# Patient Record
Sex: Female | Born: 1940 | Race: White | Hispanic: No | Marital: Married | Smoking: Current every day smoker
Health system: Southern US, Community
[De-identification: ages and names within clinical notes are randomized; demographics above are authoritative.]

## PROBLEM LIST (undated history)

## (undated) DIAGNOSIS — Z66 Do not resuscitate: Secondary | ICD-10-CM

## (undated) DIAGNOSIS — I10 Essential (primary) hypertension: Secondary | ICD-10-CM

## (undated) DIAGNOSIS — C3492 Malignant neoplasm of unspecified part of left bronchus or lung: Secondary | ICD-10-CM

## (undated) DIAGNOSIS — Z7189 Other specified counseling: Secondary | ICD-10-CM

## (undated) DIAGNOSIS — C801 Malignant (primary) neoplasm, unspecified: Secondary | ICD-10-CM

## (undated) DIAGNOSIS — Z8679 Personal history of other diseases of the circulatory system: Secondary | ICD-10-CM

## (undated) DIAGNOSIS — M810 Age-related osteoporosis without current pathological fracture: Secondary | ICD-10-CM

## (undated) DIAGNOSIS — R7401 Elevation of levels of liver transaminase levels: Secondary | ICD-10-CM

## (undated) DIAGNOSIS — R740 Nonspecific elevation of levels of transaminase and lactic acid dehydrogenase [LDH]: Secondary | ICD-10-CM

## (undated) DIAGNOSIS — E785 Hyperlipidemia, unspecified: Secondary | ICD-10-CM

## (undated) HISTORY — DX: Other specified counseling: Z71.89

## (undated) HISTORY — PX: TUBAL LIGATION: SHX77

## (undated) HISTORY — DX: Nonspecific elevation of levels of transaminase and lactic acid dehydrogenase (ldh): R74.0

## (undated) HISTORY — DX: Age-related osteoporosis without current pathological fracture: M81.0

## (undated) HISTORY — DX: Hyperlipidemia, unspecified: E78.5

## (undated) HISTORY — DX: Essential (primary) hypertension: I10

## (undated) HISTORY — PX: CAROTID ENDARTERECTOMY: SUR193

## (undated) HISTORY — DX: Malignant (primary) neoplasm, unspecified: C80.1

## (undated) HISTORY — DX: Elevation of levels of liver transaminase levels: R74.01

## (undated) HISTORY — DX: Malignant neoplasm of unspecified part of left bronchus or lung: C34.92

---

## 1993-04-14 HISTORY — PX: AORTIC VALVULOPLASTY: SHX142

## 1993-04-14 HISTORY — PX: KIDNEY SURGERY: SHX687

## 2001-03-09 ENCOUNTER — Ambulatory Visit (HOSPITAL_COMMUNITY): Admission: RE | Admit: 2001-03-09 | Discharge: 2001-03-09 | Payer: Self-pay | Admitting: Family Medicine

## 2002-03-15 ENCOUNTER — Ambulatory Visit (HOSPITAL_COMMUNITY): Admission: RE | Admit: 2002-03-15 | Discharge: 2002-03-15 | Payer: Self-pay | Admitting: Unknown Physician Specialty

## 2002-03-15 ENCOUNTER — Encounter: Payer: Self-pay | Admitting: Unknown Physician Specialty

## 2003-03-21 ENCOUNTER — Ambulatory Visit (HOSPITAL_COMMUNITY): Admission: RE | Admit: 2003-03-21 | Discharge: 2003-03-21 | Payer: Self-pay

## 2004-02-20 ENCOUNTER — Ambulatory Visit (HOSPITAL_COMMUNITY): Admission: RE | Admit: 2004-02-20 | Discharge: 2004-02-20 | Payer: Self-pay | Admitting: Unknown Physician Specialty

## 2004-02-28 ENCOUNTER — Ambulatory Visit (HOSPITAL_COMMUNITY): Admission: RE | Admit: 2004-02-28 | Discharge: 2004-02-28 | Payer: Self-pay | Admitting: Unknown Physician Specialty

## 2004-10-12 HISTORY — PX: COLONOSCOPY: SHX174

## 2004-10-30 ENCOUNTER — Ambulatory Visit (HOSPITAL_COMMUNITY): Admission: RE | Admit: 2004-10-30 | Discharge: 2004-10-30 | Payer: Self-pay | Admitting: Internal Medicine

## 2004-10-30 ENCOUNTER — Ambulatory Visit: Payer: Self-pay | Admitting: Internal Medicine

## 2005-02-25 ENCOUNTER — Ambulatory Visit (HOSPITAL_COMMUNITY): Admission: RE | Admit: 2005-02-25 | Discharge: 2005-02-25 | Payer: Self-pay | Admitting: Family Medicine

## 2005-03-05 ENCOUNTER — Ambulatory Visit (HOSPITAL_COMMUNITY): Admission: RE | Admit: 2005-03-05 | Discharge: 2005-03-05 | Payer: Self-pay | Admitting: Family Medicine

## 2005-07-14 ENCOUNTER — Ambulatory Visit (HOSPITAL_COMMUNITY): Admission: RE | Admit: 2005-07-14 | Discharge: 2005-07-14 | Payer: Self-pay | Admitting: Family Medicine

## 2005-07-22 ENCOUNTER — Encounter (INDEPENDENT_AMBULATORY_CARE_PROVIDER_SITE_OTHER): Payer: Self-pay | Admitting: *Deleted

## 2005-07-22 ENCOUNTER — Ambulatory Visit (HOSPITAL_BASED_OUTPATIENT_CLINIC_OR_DEPARTMENT_OTHER): Admission: RE | Admit: 2005-07-22 | Discharge: 2005-07-22 | Payer: Self-pay | Admitting: Urology

## 2005-09-24 ENCOUNTER — Ambulatory Visit (HOSPITAL_COMMUNITY): Admission: RE | Admit: 2005-09-24 | Discharge: 2005-09-24 | Payer: Self-pay | Admitting: Family Medicine

## 2006-04-01 ENCOUNTER — Ambulatory Visit (HOSPITAL_COMMUNITY): Admission: RE | Admit: 2006-04-01 | Discharge: 2006-04-01 | Payer: Self-pay | Admitting: Family Medicine

## 2007-04-06 ENCOUNTER — Ambulatory Visit (HOSPITAL_COMMUNITY): Admission: RE | Admit: 2007-04-06 | Discharge: 2007-04-06 | Payer: Self-pay | Admitting: Family Medicine

## 2008-02-01 ENCOUNTER — Ambulatory Visit (HOSPITAL_COMMUNITY): Admission: RE | Admit: 2008-02-01 | Discharge: 2008-02-01 | Payer: Self-pay | Admitting: Family Medicine

## 2008-07-19 ENCOUNTER — Ambulatory Visit (HOSPITAL_COMMUNITY): Admission: RE | Admit: 2008-07-19 | Discharge: 2008-07-19 | Payer: Self-pay | Admitting: Family Medicine

## 2008-11-21 ENCOUNTER — Emergency Department (HOSPITAL_COMMUNITY): Admission: EM | Admit: 2008-11-21 | Discharge: 2008-11-21 | Payer: Self-pay | Admitting: Emergency Medicine

## 2009-01-16 ENCOUNTER — Ambulatory Visit: Payer: Self-pay | Admitting: Vascular Surgery

## 2009-02-06 ENCOUNTER — Ambulatory Visit (HOSPITAL_COMMUNITY): Admission: RE | Admit: 2009-02-06 | Discharge: 2009-02-06 | Payer: Self-pay | Admitting: Family Medicine

## 2009-12-24 ENCOUNTER — Ambulatory Visit (HOSPITAL_COMMUNITY)
Admission: RE | Admit: 2009-12-24 | Discharge: 2009-12-24 | Payer: Self-pay | Source: Home / Self Care | Admitting: Family Medicine

## 2010-02-07 ENCOUNTER — Ambulatory Visit (HOSPITAL_COMMUNITY): Admission: RE | Admit: 2010-02-07 | Discharge: 2010-02-07 | Payer: Self-pay | Admitting: Family Medicine

## 2010-07-20 LAB — CBC
HCT: 38.6 % (ref 36.0–46.0)
Hemoglobin: 13.4 g/dL (ref 12.0–15.0)
MCHC: 34.7 g/dL (ref 30.0–36.0)
MCV: 92.5 fL (ref 78.0–100.0)
Platelets: 199 10*3/uL (ref 150–400)
RBC: 4.18 MIL/uL (ref 3.87–5.11)
RDW: 12.9 % (ref 11.5–15.5)
WBC: 10 10*3/uL (ref 4.0–10.5)

## 2010-07-20 LAB — URINALYSIS, ROUTINE W REFLEX MICROSCOPIC
Bilirubin Urine: NEGATIVE
Glucose, UA: NEGATIVE mg/dL
Hgb urine dipstick: NEGATIVE
Ketones, ur: NEGATIVE mg/dL
Nitrite: NEGATIVE
Protein, ur: NEGATIVE mg/dL
Specific Gravity, Urine: 1.025 (ref 1.005–1.030)
Urobilinogen, UA: 0.2 mg/dL (ref 0.0–1.0)
pH: 5.5 (ref 5.0–8.0)

## 2010-07-20 LAB — DIFFERENTIAL
Basophils Absolute: 0 10*3/uL (ref 0.0–0.1)
Basophils Relative: 0 % (ref 0–1)
Eosinophils Absolute: 0.1 10*3/uL (ref 0.0–0.7)
Eosinophils Relative: 1 % (ref 0–5)
Lymphocytes Relative: 12 % (ref 12–46)
Lymphs Abs: 1.2 10*3/uL (ref 0.7–4.0)
Monocytes Absolute: 0.4 10*3/uL (ref 0.1–1.0)
Monocytes Relative: 4 % (ref 3–12)
Neutro Abs: 8.3 10*3/uL — ABNORMAL HIGH (ref 1.7–7.7)
Neutrophils Relative %: 83 % — ABNORMAL HIGH (ref 43–77)

## 2010-07-20 LAB — BASIC METABOLIC PANEL
BUN: 20 mg/dL (ref 6–23)
CO2: 25 mEq/L (ref 19–32)
Calcium: 10.2 mg/dL (ref 8.4–10.5)
Chloride: 103 mEq/L (ref 96–112)
Creatinine, Ser: 0.78 mg/dL (ref 0.4–1.2)
GFR calc Af Amer: >60 mL/min (ref >60)
GFR calc non Af Amer: >60 mL/min (ref >60)
Glucose, Bld: 191 mg/dL — ABNORMAL HIGH (ref 70–99)
Potassium: 3.6 mEq/L (ref 3.5–5.1)
Sodium: 138 mEq/L (ref 135–145)

## 2010-08-27 NOTE — Procedures (Signed)
CAROTID DUPLEX EXAM   INDICATION:  History of carotid endarterectomy.   HISTORY:  Diabetes:  No.  Cardiac:  No.  Hypertension:  Yes.  Smoking:  Yes.  Previous Surgery:  Right carotid endarterectomy 15 years ago, per  patient.  CV History:  Asymptomatic.  Amaurosis Fugax No, Paresthesias No, Hemiparesis No.                                       RIGHT             LEFT  Brachial systolic pressure:         146               148  Brachial Doppler waveforms:         Normal            Normal  Vertebral direction of flow:        Antegrade         Antegrade  DUPLEX VELOCITIES (cm/sec)  CCA peak systolic                   P = 93/ D = 194   90  ECA peak systolic                   161               87  ICA peak systolic                   108               120  ICA end diastolic                   40                32  PLAQUE MORPHOLOGY:                  Heterogenous      Mixed  PLAQUE AMOUNT:                      Moderate/severe   Mild  PLAQUE LOCATION:                    ICA/CCA           ICA/CCA   IMPRESSION:  1. Patent right carotid endarterectomy site with 1-39% stenosis of the      right internal carotid artery and a significant increased velocity      noted at the right distal common carotid artery.  2. 40-59% stenosis of the left internal carotid artery.         ___________________________________________  Quita Skye Hart [place], M.D.   CH/MEDQ  D:  01/16/2009  T:  01/17/2009  Job:  [id number]

## 2010-08-27 NOTE — Procedures (Signed)
RENAL ARTERY DUPLEX EVALUATION   INDICATION:  Follow up renal artery stent with right flank pain.   HISTORY:  Diabetes:  No.  Cardiac:  No.  Hypertension:  Yes.  Smoking:  Yes.   RENAL ARTERY DUPLEX FINDINGS:  Aorta-Proximal:  32 cm/s  Aorta-Mid:  40 cm/s  Aorta-Distal:  89 cm/s  Celiac Artery Origin:  85 cm/s  SMA Origin:  110 cm/s                                    RIGHT               LEFT  Renal Artery Origin:             73 cm/s             73 cm/s  Renal Artery Proximal:           120 cm/s            32 cm/s  Renal Artery Mid:                74 cm/s             33 cm/s  Renal Artery Distal:             106 cm/s            38 cm/s  Hilar Acceleration Time (AT):  Renal-Aortic Ratio (RAR):        2.2                 2.2  Kidney Size:                     7.35 cm             10.6 cm  End Diastolic Ratio (EDR):  Resistive Index (RI):            0.68                0.75   IMPRESSION:  1. Patent aortobifemoral bypass graft with no evidence of focal      stenosis.  2. Patent bilateral renal arteries with no evidence of stenosis.  3. Bilateral kidneys measure within normal limits.  4. Bilateral resistive indices are within normal limits.   ___________________________________________  Quita Skye Hart [place], M.D.   MG/MEDQ  D:  01/16/2009  T:  01/17/2009  Job:  [id number]

## 2010-08-27 NOTE — Consult Note (Signed)
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  She did well from  that standpoint with correction of her hypertension and has done well  over the years from that standpoint.  Has not had any claudication  symptoms.  She developed right posterior flank discomfort about 2 months  ago and a CT scan was performed which revealed infarction of the lower  pole of the right kidney.  She was evaluated by Dr. Vonita Moss.  CT  angiogram was performed which I reviewed today.  This reveals the area  of infarction in the lower pole of the kidney and reveals widely patent  bilateral aorto renal bypass grafts with similar sized kidneys.  No  other pathology was noted and she was referred for further evaluation.   PAST MEDICAL HISTORY:  1. Hypertension.  2. History of right lower pole renal infarction.  3. Negative for coronary artery disease, hyperlipidemia, COPD or      stroke.  4. History of bladder cancer requiring periodic cystoscopy.   FAMILY HISTORY:  Positive for coronary artery disease in her mother who  had coronary artery bypass grafting, stroke in a grandmother.  Negative  for diabetes.   SOCIAL HISTORY:  She is married, has two children, is self-employed.  Smokes half pack of cigarettes per day now but has smoked greater than a  pack of cigarettes per day for many years of her life.  Does not use  alcohol.   REVIEW OF SYSTEMS:  Has occasional chest tightness.  No dyspnea on  exertion, palpitations or orthopnea.  Denies any pulmonary or GI  symptoms.  Does have urinary frequency.  No lower extremity claudication  or  neurologic, orthopedic or ENT or hematologic problems.  All other  systems negative.   ALLERGIES:  To sulfa.   MEDICATIONS:  Please see health history exam.   PHYSICAL EXAM:  Vital signs:  Blood pressure 147/82, heart rate 69,  respirations 14.  General:  She is a healthy-appearing female in no  apparent distress, alert and oriented x3.  Neck:  Supple, 3+ carotid  pulses palpable.  No bruits are audible.  Neurological:  Exam is normal.  No palpable adenopathy in the neck.  Chest:  Clear to auscultation.  Cardiovascular:  Regular rhythm.  No murmurs.  Abdomen:  Soft, nontender  with no masses.  No bruits are audible in the abdomen.  She has 3+  femoral and popliteal pulses bilaterally with well-perfused lower  extremities.   PAST SURGICAL HISTORY:  1. Includes aorta to right femoral and left iliac bypass and bilateral      renal artery bypass in 1996.  2. Right carotid endarterectomy approximately 1995.   Duplex scan performed in our office today revealed both renal artery  bypass grafts to be widely patent with normal appearing flow and no  evidence of any stenosis or abnormalities with symmetrical kidneys,  right kidney 11.35, left kidney 10.66 cm.   The patient had a recent right  renal infarction but it does not appear  to be related to her renal artery bypass grafts which appear to be  widely patent and functioning normally.  There is no need for any  further evaluation of these grafts at the present time unless she  develops new or further problems.  Carotid duplex exam was performed  today since she has not had on in 15 years and had previous right  carotid surgery.  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  No  flow reduction on the right side where the surgery was performed.   Quita Skye Hart [place], M.D.  Electronically Signed   JDL/MEDQ  D:  01/16/2009  T:  01/17/2009  Job:  [id number]   cc:   Lorin Picket A. Gerda Diss, MD  Maretta Bees. Vonita Moss, M.D.

## 2010-08-30 NOTE — Op Note (Signed)
[name], Michelle Mendoza             ACCOUNT NO.:  [id number]   MEDICAL RECORD NO.:  [id number]          PATIENT TYPE:  AMB   LOCATION:  DAY                           FACILITY:  [facility]   PHYSICIAN:  R. Roetta Sessions, M.D. DATE OF BIRTH:  Jun 16, 1940   DATE OF PROCEDURE:  10/30/2004  DATE OF DISCHARGE:                                 OPERATIVE REPORT   PROCEDURE:  Screening colonoscopy.   INDICATIONS FOR PROCEDURE:  The patient is a 70 year old lady referred over  courtesy of Dr. Lilyan Punt for colorectal cancer screening.  She is devoid  of any lower GI tract symptoms.  There is no family history of colorectal  neoplasia.  She had a colonoscopy several years ago by Dr. Lovell Sheehan without  any significant findings. Colonoscopy is now being done as a screening  maneuver.  This procedure has been discussed with the patient at length.  The potential risks, benefits, and alternatives have been reviewed.  Questions answered.  All parties are agreeable.   PROCEDURE NOTE:  O2 saturation, blood pressure, pulse __________ monitored  throughout the entire procedure.   CONSCIOUS SEDATION:  Versed 3 mg IV, Demerol 50 mg IV in divided doses.   INSTRUMENT:  Olympus video __________ system.   FINDINGS:  Digital exam revealed no abnormalities.  Endoscopic findings:  Prep was good.  Rectum:  Examination of the rectum mucosa including  retroflexion at the anal verge revealed only minimal internal hemorrhoids.   Colonic mucosa was surveyed from the rectosigmoid junction, through the  left, transverse, and right colon, to the appendiceal orifice, ileocecal  valve, and cecum.  These structures were well seen and photographed for the  record.  From this level, the scope was slowly withdrawn.  All previously  mentioned mucosal surfaces were again seen.  The colonic mucosa appeared  normal, and again the rectal mucosa appeared normal aside from minimal  internal hemorrhoids.  The patient tolerated the  procedure well and was  reactivated.   ENDOSCOPIC IMPRESSION:  Minimal internal hemorrhoids.  Otherwise normal  rectum and colon.   RECOMMENDATIONS:  Repeat colonoscopy in 10 years.       RMR/MEDQ  D:  10/30/2004  T:  10/30/2004  Job:  [id number]   cc:   Lorin Picket A. Gerda Diss, MD  [address], [address]  [place]  [place] [address]  Fax: [phone]   R. Roetta Sessions, M.D.  [address]  [place]  [place] [id number]

## 2010-08-30 NOTE — Op Note (Signed)
NAMEQUANTA, Michelle Mendoza             ACCOUNT NO.:  [id number]   MEDICAL RECORD NO.:  [id number]          PATIENT TYPE:  AMB   LOCATION:  [facility]                         FACILITY:  [facility]   PHYSICIAN:  Maretta Bees. Vonita Moss, M.D.DATE OF BIRTH:  1941/02/10   DATE OF PROCEDURE:  07/22/2005  DATE OF DISCHARGE:                                 OPERATIVE REPORT   PREOPERATIVE DIAGNOSIS:  Bladder carcinoma.   POSTOPERATIVE DIAGNOSIS:  Bladder carcinoma.   PROCEDURE:  Cystoscopy, urethral dilation, resection of bladder tumor (small  tumor overlying left intramural ureter) and bilateral retrograde pyelograms  with interpretation.   SURGEON:  Maretta Bees. Vonita Moss, M.D.   ANESTHESIA:  General.   INDICATIONS:  This lady has had gross hematuria and was found in office  workup to have a papillary tumor over the left intramural ureter.  She is  brought to the OR today for further evaluation, treatment and therapy.   PROCEDURE:  The patient is brought to the operating room and placed in  lithotomy position.  External genitalia were prepped and draped in the usual  fashion.  She was cystoscoped after dilating a mildly stenotic urethral  meatus.  The only abnormality was two small papillary tumors overlying the  left intramural ureter, and there was bleeding from the larger of the two  tumors.  No other lesions were seen in the bladder.   Bilateral retrograde pyelograms were obtained by using the cone-tip catheter  inserted per the cystoscope, and the upper tracts were normal with no  obstruction and no filling defects.   I left the left ureteral catheter in the left ureter and then removed the  cystoscope and reinserted it with the cold cup bladder biopsy forceps.  I  then resected the two small papillary tumors totally.  The bases of the  biopsy sites were fulgurated.  The lesions appeared to be very superficial.  The ureteral orifice and the intramural ureter were essentially intact at  the completion  the procedure and there was no more bleeding.  The bladder  was emptied and the scope removed and the patient sent to the recovery room  in good condition having tolerated the procedure well.      Maretta Bees. Vonita Moss, M.D.  Electronically Signed     LJP/MEDQ  D:  07/22/2005  T:  07/22/2005  Job:  [id number]

## 2010-11-27 ENCOUNTER — Other Ambulatory Visit: Payer: Self-pay | Admitting: Family Medicine

## 2010-11-27 DIAGNOSIS — Z139 Encounter for screening, unspecified: Secondary | ICD-10-CM

## 2011-02-10 ENCOUNTER — Ambulatory Visit (HOSPITAL_COMMUNITY)
Admission: RE | Admit: 2011-02-10 | Discharge: 2011-02-10 | Disposition: A | Discharge status: Home / Self Care | Payer: Medicare Other | Source: Ambulatory Visit | Attending: Family Medicine | Admitting: Family Medicine

## 2011-02-10 DIAGNOSIS — Z139 Encounter for screening, unspecified: Secondary | ICD-10-CM

## 2011-02-10 DIAGNOSIS — Z1231 Encounter for screening mammogram for malignant neoplasm of breast: Secondary | ICD-10-CM | POA: Insufficient documentation

## 2011-09-10 DIAGNOSIS — E782 Mixed hyperlipidemia: Secondary | ICD-10-CM | POA: Diagnosis not present

## 2011-09-10 DIAGNOSIS — Z79899 Other long term (current) drug therapy: Secondary | ICD-10-CM | POA: Diagnosis not present

## 2011-09-30 ENCOUNTER — Other Ambulatory Visit: Payer: Self-pay | Admitting: Family Medicine

## 2011-09-30 DIAGNOSIS — M949 Disorder of cartilage, unspecified: Secondary | ICD-10-CM | POA: Diagnosis not present

## 2011-09-30 DIAGNOSIS — E785 Hyperlipidemia, unspecified: Secondary | ICD-10-CM | POA: Diagnosis not present

## 2011-09-30 DIAGNOSIS — M8580 Other specified disorders of bone density and structure, unspecified site: Secondary | ICD-10-CM

## 2011-09-30 DIAGNOSIS — I10 Essential (primary) hypertension: Secondary | ICD-10-CM | POA: Diagnosis not present

## 2011-09-30 DIAGNOSIS — M899 Disorder of bone, unspecified: Secondary | ICD-10-CM | POA: Diagnosis not present

## 2011-10-14 ENCOUNTER — Other Ambulatory Visit (HOSPITAL_COMMUNITY): Payer: Medicare Other

## 2011-10-21 ENCOUNTER — Ambulatory Visit (HOSPITAL_COMMUNITY)
Admission: RE | Admit: 2011-10-21 | Discharge: 2011-10-21 | Disposition: A | Discharge status: Home / Self Care | Payer: Medicare Other | Source: Ambulatory Visit | Attending: Family Medicine | Admitting: Family Medicine

## 2011-10-21 DIAGNOSIS — M8580 Other specified disorders of bone density and structure, unspecified site: Secondary | ICD-10-CM

## 2011-10-21 DIAGNOSIS — Z780 Asymptomatic menopausal state: Secondary | ICD-10-CM | POA: Diagnosis not present

## 2011-10-21 DIAGNOSIS — M899 Disorder of bone, unspecified: Secondary | ICD-10-CM | POA: Insufficient documentation

## 2011-10-21 DIAGNOSIS — M949 Disorder of cartilage, unspecified: Secondary | ICD-10-CM | POA: Insufficient documentation

## 2011-12-02 ENCOUNTER — Ambulatory Visit (INDEPENDENT_AMBULATORY_CARE_PROVIDER_SITE_OTHER): Payer: Medicare Other | Admitting: Urology

## 2011-12-02 DIAGNOSIS — Z8551 Personal history of malignant neoplasm of bladder: Secondary | ICD-10-CM | POA: Diagnosis not present

## 2011-12-02 DIAGNOSIS — C679 Malignant neoplasm of bladder, unspecified: Secondary | ICD-10-CM

## 2012-01-05 ENCOUNTER — Other Ambulatory Visit: Payer: Self-pay | Admitting: Family Medicine

## 2012-01-05 DIAGNOSIS — IMO0001 Reserved for inherently not codable concepts without codable children: Secondary | ICD-10-CM

## 2012-02-12 ENCOUNTER — Ambulatory Visit (HOSPITAL_COMMUNITY)
Admission: RE | Admit: 2012-02-12 | Discharge: 2012-02-12 | Disposition: A | Discharge status: Home / Self Care | Payer: Medicare Other | Source: Ambulatory Visit | Attending: Family Medicine | Admitting: Family Medicine

## 2012-02-12 DIAGNOSIS — Z1231 Encounter for screening mammogram for malignant neoplasm of breast: Secondary | ICD-10-CM | POA: Diagnosis not present

## 2012-02-12 DIAGNOSIS — IMO0001 Reserved for inherently not codable concepts without codable children: Secondary | ICD-10-CM

## 2012-02-12 LAB — HM MAMMOGRAPHY

## 2012-03-04 DIAGNOSIS — Z01419 Encounter for gynecological examination (general) (routine) without abnormal findings: Secondary | ICD-10-CM | POA: Diagnosis not present

## 2012-03-04 DIAGNOSIS — I10 Essential (primary) hypertension: Secondary | ICD-10-CM | POA: Diagnosis not present

## 2012-03-04 DIAGNOSIS — E785 Hyperlipidemia, unspecified: Secondary | ICD-10-CM | POA: Diagnosis not present

## 2012-09-22 ENCOUNTER — Other Ambulatory Visit (HOSPITAL_COMMUNITY): Payer: Self-pay | Admitting: Family Medicine

## 2012-10-08 ENCOUNTER — Other Ambulatory Visit (HOSPITAL_COMMUNITY): Payer: Self-pay | Admitting: Family Medicine

## 2012-10-26 ENCOUNTER — Other Ambulatory Visit (HOSPITAL_COMMUNITY): Payer: Self-pay | Admitting: Family Medicine

## 2012-12-07 ENCOUNTER — Ambulatory Visit (INDEPENDENT_AMBULATORY_CARE_PROVIDER_SITE_OTHER): Payer: Medicare Other | Admitting: Urology

## 2012-12-07 DIAGNOSIS — C679 Malignant neoplasm of bladder, unspecified: Secondary | ICD-10-CM | POA: Diagnosis not present

## 2012-12-07 DIAGNOSIS — N281 Cyst of kidney, acquired: Secondary | ICD-10-CM | POA: Diagnosis not present

## 2012-12-22 DIAGNOSIS — H52229 Regular astigmatism, unspecified eye: Secondary | ICD-10-CM | POA: Diagnosis not present

## 2012-12-22 DIAGNOSIS — H524 Presbyopia: Secondary | ICD-10-CM | POA: Diagnosis not present

## 2012-12-22 DIAGNOSIS — H35369 Drusen (degenerative) of macula, unspecified eye: Secondary | ICD-10-CM | POA: Diagnosis not present

## 2012-12-22 DIAGNOSIS — H5200 Hypermetropia, unspecified eye: Secondary | ICD-10-CM | POA: Diagnosis not present

## 2012-12-23 DIAGNOSIS — E785 Hyperlipidemia, unspecified: Secondary | ICD-10-CM | POA: Diagnosis not present

## 2012-12-23 DIAGNOSIS — Z01419 Encounter for gynecological examination (general) (routine) without abnormal findings: Secondary | ICD-10-CM | POA: Diagnosis not present

## 2012-12-23 DIAGNOSIS — I10 Essential (primary) hypertension: Secondary | ICD-10-CM | POA: Diagnosis not present

## 2012-12-24 ENCOUNTER — Other Ambulatory Visit (HOSPITAL_COMMUNITY): Payer: Self-pay | Admitting: Family Medicine

## 2012-12-27 NOTE — Telephone Encounter (Signed)
Refill x1+ patient needs office visit by the end of the year

## 2012-12-28 ENCOUNTER — Other Ambulatory Visit: Payer: Self-pay | Admitting: *Deleted

## 2012-12-28 ENCOUNTER — Other Ambulatory Visit (HOSPITAL_COMMUNITY): Payer: Self-pay | Admitting: Unknown Physician Specialty

## 2012-12-28 DIAGNOSIS — Z139 Encounter for screening, unspecified: Secondary | ICD-10-CM

## 2012-12-28 MED ORDER — LISINOPRIL-HYDROCHLOROTHIAZIDE 20-12.5 MG PO TABS: ORAL_TABLET | ORAL | Status: DC

## 2012-12-31 ENCOUNTER — Telehealth: Payer: Self-pay | Admitting: Family Medicine

## 2012-12-31 ENCOUNTER — Other Ambulatory Visit: Payer: Self-pay

## 2012-12-31 DIAGNOSIS — E785 Hyperlipidemia, unspecified: Secondary | ICD-10-CM

## 2012-12-31 DIAGNOSIS — Z79899 Other long term (current) drug therapy: Secondary | ICD-10-CM

## 2012-12-31 NOTE — Telephone Encounter (Signed)
Bloodwork was ordered. Patient was notified.  

## 2012-12-31 NOTE — Telephone Encounter (Signed)
Patient would like paperwork faxed to [phone] to have blood work completed

## 2013-01-03 ENCOUNTER — Other Ambulatory Visit: Payer: Self-pay | Admitting: Family Medicine

## 2013-01-05 DIAGNOSIS — E785 Hyperlipidemia, unspecified: Secondary | ICD-10-CM | POA: Diagnosis not present

## 2013-01-05 DIAGNOSIS — Z79899 Other long term (current) drug therapy: Secondary | ICD-10-CM | POA: Diagnosis not present

## 2013-01-05 LAB — HEPATIC FUNCTION PANEL
AST: 55 U/L — ABNORMAL HIGH (ref 0–37)
Alkaline Phosphatase: 46 U/L (ref 39–117)
Bilirubin, Direct: 0.2 mg/dL (ref 0.0–0.3)
Indirect Bilirubin: 0.7 mg/dL (ref 0.0–0.9)
Total Bilirubin: 0.9 mg/dL (ref 0.3–1.2)

## 2013-01-05 LAB — BASIC METABOLIC PANEL
CO2: 32 mEq/L (ref 19–32)
Calcium: 9.8 mg/dL (ref 8.4–10.5)
Chloride: 102 mEq/L (ref 96–112)
Creat: 1.2 mg/dL — ABNORMAL HIGH (ref 0.50–1.10)
Glucose, Bld: 87 mg/dL (ref 70–99)

## 2013-01-05 LAB — LIPID PANEL: LDL Cholesterol: 68 mg/dL (ref 0–99)

## 2013-01-12 ENCOUNTER — Encounter: Payer: Self-pay | Admitting: Family Medicine

## 2013-01-12 ENCOUNTER — Ambulatory Visit (INDEPENDENT_AMBULATORY_CARE_PROVIDER_SITE_OTHER): Payer: Medicare Other | Admitting: Family Medicine

## 2013-01-12 VITALS — BP 142/72 | Ht 63.0 in | Wt 124.4 lb

## 2013-01-12 DIAGNOSIS — I10 Essential (primary) hypertension: Secondary | ICD-10-CM | POA: Diagnosis not present

## 2013-01-12 DIAGNOSIS — E785 Hyperlipidemia, unspecified: Secondary | ICD-10-CM

## 2013-01-12 DIAGNOSIS — N289 Disorder of kidney and ureter, unspecified: Secondary | ICD-10-CM | POA: Insufficient documentation

## 2013-01-12 DIAGNOSIS — K7689 Other specified diseases of liver: Secondary | ICD-10-CM | POA: Diagnosis not present

## 2013-01-12 DIAGNOSIS — K760 Fatty (change of) liver, not elsewhere classified: Secondary | ICD-10-CM | POA: Insufficient documentation

## 2013-01-12 MED ORDER — PRAVASTATIN SODIUM 20 MG PO TABS: ORAL_TABLET | ORAL | Status: DC

## 2013-01-12 MED ORDER — ATENOLOL 50 MG PO TABS: ORAL_TABLET | ORAL | Status: DC

## 2013-01-12 MED ORDER — LISINOPRIL-HYDROCHLOROTHIAZIDE 20-12.5 MG PO TABS: ORAL_TABLET | ORAL | Status: DC

## 2013-01-12 NOTE — Patient Instructions (Signed)
DASH Diet  The DASH diet stands for "Dietary Approaches to Stop Hypertension." It is a healthy eating plan that has been shown to reduce high blood pressure (hypertension) in as little as 14 days, while also possibly providing other significant health benefits. These other health benefits include reducing the risk of breast cancer after menopause and reducing the risk of type 2 diabetes, heart disease, colon cancer, and stroke. Health benefits also include weight loss and slowing kidney failure in patients with chronic kidney disease.   DIET GUIDELINES  · Limit salt (sodium). Your diet should contain less than 1500 mg of sodium daily.  · Limit refined or processed carbohydrates. Your diet should include mostly whole grains. Desserts and added sugars should be used sparingly.  · Include small amounts of heart-healthy fats. These types of fats include nuts, oils, and tub margarine. Limit saturated and trans fats. These fats have been shown to be harmful in the body.  CHOOSING FOODS   The following food groups are based on a 2000 calorie diet. See your Registered Dietitian for individual calorie needs.  Grains and Grain Products (6 to 8 servings daily)  · Eat More Often: Whole-wheat bread, brown rice, whole-grain or wheat pasta, quinoa, popcorn without added fat or salt (air popped).  · Eat Less Often: White bread, white pasta, white rice, cornbread.  Vegetables (4 to 5 servings daily)  · Eat More Often: Fresh, frozen, and canned vegetables. Vegetables may be raw, steamed, roasted, or grilled with a minimal amount of fat.  · Eat Less Often/Avoid: Creamed or fried vegetables. Vegetables in a cheese sauce.  Fruit (4 to 5 servings daily)  · Eat More Often: All fresh, canned (in natural juice), or frozen fruits. Dried fruits without added sugar. One hundred percent fruit juice (½ cup [237 mL] daily).  · Eat Less Often: Dried fruits with added sugar. Canned fruit in light or heavy syrup.  Lean Meats, Fish, and Poultry (2  servings or less daily. One serving is 3 to 4 oz [85-114 g]).  · Eat More Often: Ninety percent or leaner ground beef, tenderloin, sirloin. Round cuts of beef, chicken breast, turkey breast. All fish. Grill, bake, or broil your meat. Nothing should be fried.  · Eat Less Often/Avoid: Fatty cuts of meat, turkey, or chicken leg, thigh, or wing. Fried cuts of meat or fish.  Dairy (2 to 3 servings)  · Eat More Often: Low-fat or fat-free milk, low-fat plain or light yogurt, reduced-fat or part-skim cheese.  · Eat Less Often/Avoid: Milk (whole, 2%). Whole milk yogurt. Full-fat cheeses.  Nuts, Seeds, and Legumes (4 to 5 servings per week)  · Eat More Often: All without added salt.  · Eat Less Often/Avoid: Salted nuts and seeds, canned beans with added salt.  Fats and Sweets (limited)  · Eat More Often: Vegetable oils, tub margarines without trans fats, sugar-free gelatin. Mayonnaise and salad dressings.  · Eat Less Often/Avoid: Coconut oils, palm oils, butter, stick margarine, cream, half and half, cookies, candy, pie.  FOR MORE INFORMATION  The Dash Diet Eating Plan: www.dashdiet.org  Document Released: 03/20/2011 Document Revised: 06/23/2011 Document Reviewed: 03/20/2011  Exit[employer]® Patient Information ©2014 [employer].

## 2013-01-12 NOTE — Progress Notes (Signed)
  Subjective:    Patient ID: Michelle Mendoza, female    DOB: May 29, 1940, 72 y.o.   MRN: [id number]  HPI Here today for 6 month check up. This patient has a history hyperlipidemia hypertension renal insufficiency fatty liver. She's been trying eat relatively healthy. She states she's very active and her family workplace. She does not type of aerobic activity. She does smoke she knows she needs to quit. She denies any chest tightness pressure pain. Denies any coughing denies any vomiting. She relates compliance with her medicines. Family history reviewed Review of systems see above  She had BW done on 9/24  No concerns.    Review of Systems    denies rectal bleeding denies headaches no chest pain or shortness of breath blood pressure stable, her lungs are clear. Heart regular. Pulse normal. Extremities no edema. Skin warm dry.  Objective:   Physical Exam Please see above.       Assessment & Plan:  #1 HTN decent control continue current medication #2 renal insufficiency stable. Recheck metabolic 7 again in 6 months #3 hyperlipidemia overall very good continue current medication check lab work in 6 months Fatty liver-healthy diet monitor liver profile 6 months Encouraged patient to quit smoking.

## 2013-01-29 DIAGNOSIS — Z23 Encounter for immunization: Secondary | ICD-10-CM | POA: Diagnosis not present

## 2013-02-14 ENCOUNTER — Ambulatory Visit (HOSPITAL_COMMUNITY)
Admission: RE | Admit: 2013-02-14 | Discharge: 2013-02-14 | Disposition: A | Discharge status: Home / Self Care | Payer: Medicare Other | Source: Ambulatory Visit | Attending: Unknown Physician Specialty | Admitting: Unknown Physician Specialty

## 2013-02-14 DIAGNOSIS — Z1231 Encounter for screening mammogram for malignant neoplasm of breast: Secondary | ICD-10-CM | POA: Insufficient documentation

## 2013-02-14 DIAGNOSIS — Z139 Encounter for screening, unspecified: Secondary | ICD-10-CM

## 2013-06-01 ENCOUNTER — Telehealth: Payer: Self-pay | Admitting: Family Medicine

## 2013-06-01 DIAGNOSIS — Z79899 Other long term (current) drug therapy: Secondary | ICD-10-CM

## 2013-06-01 NOTE — Telephone Encounter (Signed)
Needs liver and Met 7 only (also f/u ov)

## 2013-06-01 NOTE — Telephone Encounter (Signed)
Had lipid liver met 7 on 01/05/13

## 2013-06-01 NOTE — Telephone Encounter (Signed)
Patient needs blood work.

## 2013-06-02 NOTE — Telephone Encounter (Signed)
Bloodwork ordered in the system. Patient was notified.

## 2013-06-15 DIAGNOSIS — Z79899 Other long term (current) drug therapy: Secondary | ICD-10-CM | POA: Diagnosis not present

## 2013-06-15 LAB — HEPATIC FUNCTION PANEL
ALT: <8 U/L (ref 0–35)
AST: 50 U/L — AB (ref 0–37)
Albumin: 4 g/dL (ref 3.5–5.2)
Alkaline Phosphatase: 46 U/L (ref 39–117)
Bilirubin, Direct: 0.1 mg/dL (ref 0.0–0.3)
Indirect Bilirubin: 0.6 mg/dL (ref 0.2–1.2)
TOTAL PROTEIN: 6.9 g/dL (ref 6.0–8.3)
Total Bilirubin: 0.7 mg/dL (ref 0.2–1.2)

## 2013-06-15 LAB — BASIC METABOLIC PANEL
BUN: 24 mg/dL — AB (ref 6–23)
CO2: 29 mEq/L (ref 19–32)
Calcium: 9.8 mg/dL (ref 8.4–10.5)
Chloride: 100 mEq/L (ref 96–112)
Creat: 1.06 mg/dL (ref 0.50–1.10)
Glucose, Bld: 92 mg/dL (ref 70–99)
POTASSIUM: 4.4 meq/L (ref 3.5–5.3)
Sodium: 137 mEq/L (ref 135–145)

## 2013-06-21 ENCOUNTER — Ambulatory Visit (INDEPENDENT_AMBULATORY_CARE_PROVIDER_SITE_OTHER): Payer: Medicare Other | Admitting: Family Medicine

## 2013-06-21 ENCOUNTER — Encounter: Payer: Self-pay | Admitting: Family Medicine

## 2013-06-21 VITALS — BP 128/72 | Ht 63.0 in | Wt 121.0 lb

## 2013-06-21 DIAGNOSIS — I10 Essential (primary) hypertension: Secondary | ICD-10-CM

## 2013-06-21 DIAGNOSIS — K7689 Other specified diseases of liver: Secondary | ICD-10-CM

## 2013-06-21 DIAGNOSIS — N289 Disorder of kidney and ureter, unspecified: Secondary | ICD-10-CM | POA: Diagnosis not present

## 2013-06-21 DIAGNOSIS — K760 Fatty (change of) liver, not elsewhere classified: Secondary | ICD-10-CM

## 2013-06-21 DIAGNOSIS — E785 Hyperlipidemia, unspecified: Secondary | ICD-10-CM

## 2013-06-21 MED ORDER — LISINOPRIL-HYDROCHLOROTHIAZIDE 20-12.5 MG PO TABS: ORAL_TABLET | ORAL | Status: DC

## 2013-06-21 MED ORDER — PRAVASTATIN SODIUM 20 MG PO TABS
ORAL_TABLET | ORAL | Status: DC
Start: 2013-06-21 — End: 2014-04-13

## 2013-06-21 MED ORDER — ATENOLOL 50 MG PO TABS: ORAL_TABLET | ORAL | Status: DC

## 2013-06-21 NOTE — Progress Notes (Addendum)
   Subjective:    Patient ID: Michelle Mendoza, female    DOB: Sep 27, 1940, 73 y.o.   MRN: [id number]  HPI Patient is here today for 6 month check up. She relates that she's taking her medicine as directed she does try the healthy she does try to stay physically active she denies any type of chest tightness pressure pain shortness breath or vomiting. She is trying to keep her weight from going up she is aware of her fatty liver. She also had renal insufficiency on last lab work but recently had blood work done and is here today to go over the results of this. She also takes her cholesterol medicine as recommended to keep her hyperlipidemia under good control. She is a smoker she was counseled to quit smoking. She denies being depressed She already got her BW done.  She has no questions/concerns.    Review of Systems  Constitutional: Negative for activity change, appetite change and fatigue.  HENT: Negative for congestion, ear discharge and rhinorrhea.   Eyes: Negative for discharge.  Respiratory: Negative for cough, chest tightness and wheezing.   Cardiovascular: Negative for chest pain.  Gastrointestinal: Negative for vomiting and abdominal pain.  Genitourinary: Negative for frequency and difficulty urinating.  Musculoskeletal: Negative for neck pain.  Allergic/Immunologic: Negative for environmental allergies and food allergies.  Neurological: Negative for weakness and headaches.  Psychiatric/Behavioral: Negative for behavioral problems and agitation.       Objective:   Physical Exam  Her lungs are clear hearts regular pulse normal neck no masses abdomen soft no guarding or rebound extremities no edema skin warm dry neurologic grossly normal      Assessment & Plan:  Essential hypertension, benign  Renal insufficiency  Hyperlipemia  Fatty liver  Her blood pressure under good control, kidney function try to better than what they were, lipid panel 6 months ago look good no  need to repeat it currently repeated again in 6 months continue medication watch diet Liver enzymes slightly improved watch diet regular exercise keep fatty liver under control.

## 2013-12-20 ENCOUNTER — Ambulatory Visit (INDEPENDENT_AMBULATORY_CARE_PROVIDER_SITE_OTHER): Payer: Medicare Other | Admitting: Urology

## 2013-12-20 DIAGNOSIS — C672 Malignant neoplasm of lateral wall of bladder: Secondary | ICD-10-CM

## 2013-12-20 DIAGNOSIS — C679 Malignant neoplasm of bladder, unspecified: Secondary | ICD-10-CM | POA: Diagnosis not present

## 2014-01-04 ENCOUNTER — Other Ambulatory Visit: Payer: Self-pay | Admitting: Family Medicine

## 2014-01-04 DIAGNOSIS — Z139 Encounter for screening, unspecified: Secondary | ICD-10-CM

## 2014-01-04 DIAGNOSIS — Z1231 Encounter for screening mammogram for malignant neoplasm of breast: Secondary | ICD-10-CM

## 2014-01-04 DIAGNOSIS — Z01419 Encounter for gynecological examination (general) (routine) without abnormal findings: Secondary | ICD-10-CM | POA: Diagnosis not present

## 2014-01-23 DIAGNOSIS — Z23 Encounter for immunization: Secondary | ICD-10-CM | POA: Diagnosis not present

## 2014-02-15 ENCOUNTER — Ambulatory Visit (HOSPITAL_COMMUNITY)
Admission: RE | Admit: 2014-02-15 | Discharge: 2014-02-15 | Disposition: A | Discharge status: Home / Self Care | Payer: Medicare Other | Source: Ambulatory Visit | Attending: Family Medicine | Admitting: Family Medicine

## 2014-02-15 DIAGNOSIS — Z1231 Encounter for screening mammogram for malignant neoplasm of breast: Secondary | ICD-10-CM | POA: Insufficient documentation

## 2014-03-17 ENCOUNTER — Other Ambulatory Visit: Payer: Self-pay | Admitting: Family Medicine

## 2014-04-13 ENCOUNTER — Other Ambulatory Visit: Payer: Self-pay | Admitting: Family Medicine

## 2014-06-15 ENCOUNTER — Other Ambulatory Visit: Payer: Self-pay | Admitting: Family Medicine

## 2014-06-20 ENCOUNTER — Telehealth: Payer: Self-pay | Admitting: Family Medicine

## 2014-06-20 NOTE — Telephone Encounter (Signed)
Lipid, liver, metabolic 7, vitamin D

## 2014-06-20 NOTE — Telephone Encounter (Signed)
Pt is requesting lab orders to be sent in for her upcoming appt.  Last labs were hepatic,bmp, 06/15/13

## 2014-06-21 ENCOUNTER — Other Ambulatory Visit: Payer: Self-pay | Admitting: *Deleted

## 2014-06-21 DIAGNOSIS — Z79899 Other long term (current) drug therapy: Secondary | ICD-10-CM

## 2014-06-21 DIAGNOSIS — R5383 Other fatigue: Secondary | ICD-10-CM

## 2014-06-21 DIAGNOSIS — E785 Hyperlipidemia, unspecified: Secondary | ICD-10-CM

## 2014-06-21 NOTE — Telephone Encounter (Signed)
bloodwork orders ready. Pt's husband notified.

## 2014-06-22 ENCOUNTER — Encounter: Payer: Self-pay | Admitting: Family Medicine

## 2014-06-22 ENCOUNTER — Ambulatory Visit (INDEPENDENT_AMBULATORY_CARE_PROVIDER_SITE_OTHER): Payer: Medicare Other | Admitting: Family Medicine

## 2014-06-22 VITALS — Temp 98.1°F | Ht 63.0 in | Wt 120.4 lb

## 2014-06-22 DIAGNOSIS — R300 Dysuria: Secondary | ICD-10-CM

## 2014-06-22 LAB — POCT URINALYSIS DIPSTICK
Spec Grav, UA: 1.015
pH, UA: 6

## 2014-06-22 MED ORDER — CIPROFLOXACIN HCL 250 MG PO TABS: 250.0000 mg | ORAL_TABLET | Freq: Two times a day (BID) | ORAL | Status: DC

## 2014-06-22 NOTE — Progress Notes (Signed)
   Subjective:    Patient ID: Michelle Mendoza, female    DOB: May 24, 1940, 74 y.o.   MRN: [id number]  Dysuria  This is a new problem. The current episode started in the past 7 days.   No vomiting no diarrhea no chest pain no headache.  Last urinary tract infection 10 years ago  Slight blood in urine with dysuria and increased frequency. Review of Systems  Genitourinary: Positive for dysuria.   no change in bowel habits no blood in stools     Objective:   Physical Exam  Alert no apparent distress vital stable. Lungs clear. Heart regular in rhythm. No CVA tenderness  Urinalysis2 4 rbc's  10 to 12 wbc's      Assessment & Plan:  uti disc Plan cipro 250 bid five d, symppto care and w s disc 15 min spent in disc

## 2014-06-27 ENCOUNTER — Other Ambulatory Visit: Payer: Self-pay | Admitting: Family Medicine

## 2014-06-27 ENCOUNTER — Encounter: Payer: Self-pay | Admitting: *Deleted

## 2014-06-27 DIAGNOSIS — E785 Hyperlipidemia, unspecified: Secondary | ICD-10-CM | POA: Diagnosis not present

## 2014-06-27 DIAGNOSIS — M810 Age-related osteoporosis without current pathological fracture: Secondary | ICD-10-CM | POA: Diagnosis not present

## 2014-06-27 DIAGNOSIS — R5383 Other fatigue: Secondary | ICD-10-CM | POA: Diagnosis not present

## 2014-06-27 DIAGNOSIS — Z79899 Other long term (current) drug therapy: Secondary | ICD-10-CM | POA: Diagnosis not present

## 2014-06-28 LAB — LIPID PANEL
CHOL/HDL RATIO: 3.5 ratio (ref 0.0–4.4)
Cholesterol, Total: 150 mg/dL (ref 100–199)
HDL: 43 mg/dL (ref >39)
LDL CALC: 76 mg/dL (ref 0–99)
TRIGLYCERIDES: 157 mg/dL — AB (ref 0–149)
VLDL CHOLESTEROL CAL: 31 mg/dL (ref 5–40)

## 2014-06-28 LAB — BASIC METABOLIC PANEL
BUN/Creatinine Ratio: 17 (ref 11–26)
BUN: 20 mg/dL (ref 8–27)
CHLORIDE: 98 mmol/L (ref 97–108)
CO2: 25 mmol/L (ref 18–29)
Calcium: 9.7 mg/dL (ref 8.7–10.3)
Creatinine, Ser: 1.19 mg/dL — ABNORMAL HIGH (ref 0.57–1.00)
GFR calc Af Amer: 53 mL/min/{1.73_m2} — ABNORMAL LOW (ref >59)
GFR calc non Af Amer: 46 mL/min/{1.73_m2} — ABNORMAL LOW (ref >59)
GLUCOSE: 94 mg/dL (ref 65–99)
POTASSIUM: 4.4 mmol/L (ref 3.5–5.2)
Sodium: 141 mmol/L (ref 134–144)

## 2014-06-28 LAB — HEPATIC FUNCTION PANEL
ALT: 8 IU/L (ref 0–32)
AST: 52 IU/L — ABNORMAL HIGH (ref 0–40)
Albumin: 4.4 g/dL (ref 3.5–4.8)
Alkaline Phosphatase: 55 IU/L (ref 39–117)
BILIRUBIN, DIRECT: 0.15 mg/dL (ref 0.00–0.40)
Bilirubin Total: 0.6 mg/dL (ref 0.0–1.2)
Total Protein: 7.3 g/dL (ref 6.0–8.5)

## 2014-06-28 LAB — VITAMIN D 25 HYDROXY (VIT D DEFICIENCY, FRACTURES): VIT D 25 HYDROXY: 42.2 ng/mL (ref 30.0–100.0)

## 2014-07-04 ENCOUNTER — Ambulatory Visit: Payer: Medicare Other | Admitting: Family Medicine

## 2014-07-05 ENCOUNTER — Ambulatory Visit (INDEPENDENT_AMBULATORY_CARE_PROVIDER_SITE_OTHER): Payer: Medicare Other | Admitting: Family Medicine

## 2014-07-05 ENCOUNTER — Encounter: Payer: Self-pay | Admitting: Family Medicine

## 2014-07-05 VITALS — BP 132/80 | Ht 63.0 in | Wt 121.0 lb

## 2014-07-05 DIAGNOSIS — K760 Fatty (change of) liver, not elsewhere classified: Secondary | ICD-10-CM

## 2014-07-05 DIAGNOSIS — I10 Essential (primary) hypertension: Secondary | ICD-10-CM | POA: Diagnosis not present

## 2014-07-05 DIAGNOSIS — E785 Hyperlipidemia, unspecified: Secondary | ICD-10-CM

## 2014-07-05 DIAGNOSIS — N289 Disorder of kidney and ureter, unspecified: Secondary | ICD-10-CM | POA: Diagnosis not present

## 2014-07-05 MED ORDER — PRAVASTATIN SODIUM 20 MG PO TABS: 20.0000 mg | ORAL_TABLET | Freq: Every day | ORAL | Status: DC

## 2014-07-05 MED ORDER — ZOLPIDEM TARTRATE 5 MG PO TABS: 5.0000 mg | ORAL_TABLET | Freq: Every evening | ORAL | Status: AC | PRN

## 2014-07-05 MED ORDER — ATENOLOL 50 MG PO TABS: ORAL_TABLET | ORAL | Status: DC

## 2014-07-05 MED ORDER — LISINOPRIL-HYDROCHLOROTHIAZIDE 20-12.5 MG PO TABS: 1.0000 | ORAL_TABLET | Freq: Every morning | ORAL | Status: DC

## 2014-07-05 NOTE — Progress Notes (Signed)
   Subjective:    Patient ID: Michelle Mendoza, female    DOB: [birth year], 74 y.o.   MRN: [id number]  Hypertension This is a chronic problem. The current episode started more than 1 year ago. Pertinent negatives include no chest pain. Treatments tried: atenolol, lisinopril/hctz. There are no compliance problems.    Pt states no concerns today. Needs refills. 90 day supply.  Long discussion held today regarding her medications regarding her overall health regarding smoking regarding healthy eating regular physical activity as well. Patient denies rectal bleeding. Keeps up with mammogram.  Review of Systems  Constitutional: Negative for activity change, appetite change and fatigue.  HENT: Negative for congestion.   Respiratory: Negative for cough.   Cardiovascular: Negative for chest pain.  Gastrointestinal: Negative for abdominal pain.  Endocrine: Negative for polydipsia and polyphagia.  Neurological: Negative for weakness.  Psychiatric/Behavioral: Negative for confusion.       Objective:   Physical Exam  Constitutional: She appears well-nourished. No distress.  Cardiovascular: Normal rate, regular rhythm and normal heart sounds.   No murmur heard. Pulmonary/Chest: Effort normal and breath sounds normal. No respiratory distress.  Musculoskeletal: She exhibits no edema.  Lymphadenopathy:    She has no cervical adenopathy.  Neurological: She is alert. She exhibits normal muscle tone.  Psychiatric: Her behavior is normal.  Vitals reviewed.         Assessment & Plan:  1. Renal insufficiency Stable. Will monitor. This tends to fluctuate.  2. Essential hypertension, benign Blood pressure under good control continue current measures  3. Fatty liver 1 liver enzymes slightly elevated. Will monitor recheck 6 months  4. Hyperlipemia Cholesterol overall looks good continue current measures  Patient counseled to quit smoking she might try patches. I also advised her to get a CT  scan of her chest is part of Medicare preventative testing for lung cancer she defers currently information given  Greater and 25 minutes spent discussing multiple issues going on with patient discussing with her the health ramifications and answering questions.

## 2014-09-26 ENCOUNTER — Telehealth: Payer: Self-pay | Admitting: Internal Medicine

## 2014-09-26 NOTE — Telephone Encounter (Signed)
Patient is on July recall list for TCS 10 yr

## 2014-09-27 NOTE — Telephone Encounter (Signed)
Letter mailed for her to call and get scheduled

## 2014-12-19 ENCOUNTER — Ambulatory Visit (INDEPENDENT_AMBULATORY_CARE_PROVIDER_SITE_OTHER): Payer: Medicare Other | Admitting: Urology

## 2014-12-19 DIAGNOSIS — C672 Malignant neoplasm of lateral wall of bladder: Secondary | ICD-10-CM

## 2014-12-22 ENCOUNTER — Telehealth: Payer: Self-pay | Admitting: Family Medicine

## 2014-12-22 DIAGNOSIS — E785 Hyperlipidemia, unspecified: Secondary | ICD-10-CM

## 2014-12-22 DIAGNOSIS — Z79899 Other long term (current) drug therapy: Secondary | ICD-10-CM

## 2014-12-22 NOTE — Telephone Encounter (Signed)
Met 7, lipid, liver-reason for testing hypertension and hyperlipidemia and renal insufficiency

## 2014-12-22 NOTE — Telephone Encounter (Signed)
Patient has appointment on 9/20 and would like her lab work done.

## 2014-12-22 NOTE — Telephone Encounter (Signed)
Last labwork 3/16

## 2014-12-25 NOTE — Telephone Encounter (Signed)
Notified patient that bloodwork has been ordered.

## 2014-12-25 NOTE — Telephone Encounter (Signed)
Left message on voicemail that bloodwork has been ordered.

## 2014-12-27 DIAGNOSIS — E785 Hyperlipidemia, unspecified: Secondary | ICD-10-CM | POA: Diagnosis not present

## 2014-12-27 DIAGNOSIS — Z79899 Other long term (current) drug therapy: Secondary | ICD-10-CM | POA: Diagnosis not present

## 2014-12-28 LAB — BASIC METABOLIC PANEL
BUN / CREAT RATIO: 22 (ref 11–26)
BUN: 23 mg/dL (ref 8–27)
CHLORIDE: 99 mmol/L (ref 97–108)
CO2: 27 mmol/L (ref 18–29)
CREATININE: 1.04 mg/dL — AB (ref 0.57–1.00)
Calcium: 9.7 mg/dL (ref 8.7–10.3)
GFR calc Af Amer: 62 mL/min/{1.73_m2} (ref >59)
GFR calc non Af Amer: 54 mL/min/{1.73_m2} — ABNORMAL LOW (ref >59)
GLUCOSE: 92 mg/dL (ref 65–99)
Potassium: 4.3 mmol/L (ref 3.5–5.2)
Sodium: 142 mmol/L (ref 134–144)

## 2014-12-28 LAB — LIPID PANEL
CHOLESTEROL TOTAL: 142 mg/dL (ref 100–199)
Chol/HDL Ratio: 3.4 ratio units (ref 0.0–4.4)
HDL: 42 mg/dL (ref >39)
LDL Calculated: 78 mg/dL (ref 0–99)
TRIGLYCERIDES: 110 mg/dL (ref 0–149)
VLDL Cholesterol Cal: 22 mg/dL (ref 5–40)

## 2014-12-28 LAB — HEPATIC FUNCTION PANEL
ALK PHOS: 57 IU/L (ref 39–117)
ALT: 8 IU/L (ref 0–32)
AST: 53 IU/L — ABNORMAL HIGH (ref 0–40)
Albumin: 4.2 g/dL (ref 3.5–4.8)
BILIRUBIN, DIRECT: 0.16 mg/dL (ref 0.00–0.40)
Bilirubin Total: 0.6 mg/dL (ref 0.0–1.2)
Total Protein: 7.1 g/dL (ref 6.0–8.5)

## 2015-01-01 ENCOUNTER — Other Ambulatory Visit: Payer: Self-pay | Admitting: Family Medicine

## 2015-01-02 ENCOUNTER — Encounter: Payer: Self-pay | Admitting: Family Medicine

## 2015-01-02 ENCOUNTER — Ambulatory Visit (INDEPENDENT_AMBULATORY_CARE_PROVIDER_SITE_OTHER): Payer: Medicare Other | Admitting: Family Medicine

## 2015-01-02 VITALS — BP 130/68 | Ht 63.0 in | Wt 120.2 lb

## 2015-01-02 DIAGNOSIS — M8580 Other specified disorders of bone density and structure, unspecified site: Secondary | ICD-10-CM

## 2015-01-02 DIAGNOSIS — Z780 Asymptomatic menopausal state: Secondary | ICD-10-CM | POA: Diagnosis not present

## 2015-01-02 DIAGNOSIS — I10 Essential (primary) hypertension: Secondary | ICD-10-CM

## 2015-01-02 DIAGNOSIS — Z23 Encounter for immunization: Secondary | ICD-10-CM

## 2015-01-02 DIAGNOSIS — E785 Hyperlipidemia, unspecified: Secondary | ICD-10-CM

## 2015-01-02 NOTE — Progress Notes (Signed)
   Subjective:    Patient ID: Michelle Mendoza, female    DOB: [birth year], 74 y.o.   MRN: [id number]  Hypertension This is a chronic problem. The current episode started more than 1 year ago. The problem has been gradually improving since onset. Pertinent negatives include no chest pain. There are no associated agents to hypertension. There are no known risk factors for coronary artery disease. Treatments tried: atenolol. The current treatment provides moderate improvement. There are no compliance problems.    Patient has no concerns at this time.  She still smokes she denies any shortness of breath she states her appetite is good denies rectal bleeding or hematuria states she tries watch her cholesterol in your diet and takes medication as directed she also takes her blood pressure medicine as directed denies problems she takes calcium and vitamin D for her bones and stays physically active. She is doing bone density she is also due a colonoscopy 25 minutes spent with the patient covering multiple questions and issues   Review of Systems  Constitutional: Negative for activity change, appetite change and fatigue.  HENT: Negative for congestion.   Respiratory: Negative for cough.   Cardiovascular: Negative for chest pain.  Gastrointestinal: Negative for abdominal pain.  Endocrine: Negative for polydipsia and polyphagia.  Neurological: Negative for weakness.  Psychiatric/Behavioral: Negative for confusion.       Objective:   Physical Exam  Constitutional: She appears well-nourished. No distress.  Cardiovascular: Normal rate, regular rhythm and normal heart sounds.   No murmur heard. Pulmonary/Chest: Effort normal and breath sounds normal. No respiratory distress.  Musculoskeletal: She exhibits no edema.  Lymphadenopathy:    She has no cervical adenopathy.  Neurological: She is alert. She exhibits normal muscle tone.  Psychiatric: Her behavior is normal.  Vitals reviewed.        Assessment & Plan:  History of osteopenia check bone density testing await results Hyperlipidemia good control continue current medication Insomnia doing well with medication continue this follow-up 6 months Blood pressure doing well continue current medications. Lab work looks good Patient encouraged quit smoking Pneumonia vaccine today Healthy diet regular physical activity recommended Colonoscopy recommended patient defers still early next year

## 2015-01-02 NOTE — Patient Instructions (Signed)
Dear Patient,  It has been recommended to you that you have a colonoscopy. It is your responsibility to carry through with this recommendation.   Did you realize that colon cancer is the second leading cancer killer in the [place]. One in every 20 adults will get colon cancer. If all adults would go through the recommended screening for colon cancer (getting a colonoscopy), then there would be a 60% reduction in the number of people dying from colon cancer.  Colon cancer just doesn't come out of the blue. It starts off as a small polyp which over time grows into a cancer. A colonoscopy can prevent cancer and in many cases detected when it is at a very treatable phase. Small colon cancers can have cure rates of 95%. Advanced colon cancer, which often occurs in people who do not do their screenings, have cure rates less than 20%. The risk of colon cancer advances with age. Most adults should have regular colonoscopies every 10 years starting at age 49. This recommendation can vary depending on a person's medical history.  Health-care laws now allow for you to call the gastroenterologist office directly in order to set yourself up for this very important tests. Today we have recommended to you that you do this test. This test may save your life. Failure to do this test puts you at risk for premature death from colon cancer. Do the right thing and schedule this test now.  Here as a list of specialists we recommend in the surrounding area. When you call their office let them know that you are a patient of our practice in your interested in doing a screening colonoscopy. They should assist you without problems. You will need the following information when you called them: 1-name of which Dr. you see, 2-your insurance information, 3-a list of medications that you currently take, 4-any allergies you have to medications.  [place] gastroenterologist Dr. Milton Ferguson, Dr Felicie Morn  gastroenterologist   [facility]   [phone]  [facility] gastroenterology (Dr. Garnetta Buddy and [place]) [phone]  [facility] gastroenterology (Dr. Leia Alf, Harrietta Guardian, [place]) [phone]  Each group of specialists has assured [place] that when you called them they will help you get your colonoscopy set up. Should you have problems or if the GI practice insist a referral be done please let us know. Be sure to call soon. Sincerely, Pearson Forster, Dr Mickie Hillier, [place]

## 2015-01-08 DIAGNOSIS — Z6820 Body mass index (BMI) 20.0-20.9, adult: Secondary | ICD-10-CM | POA: Diagnosis not present

## 2015-01-08 DIAGNOSIS — Z01419 Encounter for gynecological examination (general) (routine) without abnormal findings: Secondary | ICD-10-CM | POA: Diagnosis not present

## 2015-01-09 ENCOUNTER — Other Ambulatory Visit: Payer: Self-pay | Admitting: Family Medicine

## 2015-01-09 DIAGNOSIS — Z1231 Encounter for screening mammogram for malignant neoplasm of breast: Secondary | ICD-10-CM

## 2015-01-17 ENCOUNTER — Ambulatory Visit (HOSPITAL_COMMUNITY)
Admission: RE | Admit: 2015-01-17 | Discharge: 2015-01-17 | Disposition: A | Discharge status: Home / Self Care | Payer: Medicare Other | Source: Ambulatory Visit | Attending: Family Medicine | Admitting: Family Medicine

## 2015-01-17 ENCOUNTER — Other Ambulatory Visit (HOSPITAL_COMMUNITY): Payer: Medicare Other

## 2015-01-17 DIAGNOSIS — Z87891 Personal history of nicotine dependence: Secondary | ICD-10-CM | POA: Diagnosis not present

## 2015-01-17 DIAGNOSIS — Z780 Asymptomatic menopausal state: Secondary | ICD-10-CM | POA: Insufficient documentation

## 2015-01-17 DIAGNOSIS — M8580 Other specified disorders of bone density and structure, unspecified site: Secondary | ICD-10-CM | POA: Diagnosis not present

## 2015-01-17 DIAGNOSIS — M85851 Other specified disorders of bone density and structure, right thigh: Secondary | ICD-10-CM | POA: Diagnosis not present

## 2015-01-19 ENCOUNTER — Telehealth: Payer: Self-pay | Admitting: Family Medicine

## 2015-01-19 MED ORDER — ALENDRONATE SODIUM 70 MG PO TABS: 70.0000 mg | ORAL_TABLET | ORAL | Status: DC

## 2015-01-19 NOTE — Telephone Encounter (Signed)
Fosamax 70, #4,6 refills, 1qweek, take correctly!! ( in the am on a empty stomach, with 8oz water, upright for 30 min before eating breakfast

## 2015-01-19 NOTE — Telephone Encounter (Signed)
Pt would like to go ahead with the fosamax 70 mg 1x [facility] apoth

## 2015-01-19 NOTE — Telephone Encounter (Signed)
Called patient and informed her per Dr.Scott Luking- Fosamax sent into pharmacy. Patient verbalized understanding.

## 2015-01-21 DIAGNOSIS — Z23 Encounter for immunization: Secondary | ICD-10-CM | POA: Diagnosis not present

## 2015-02-21 ENCOUNTER — Ambulatory Visit (HOSPITAL_COMMUNITY)
Admission: RE | Admit: 2015-02-21 | Discharge: 2015-02-21 | Disposition: A | Discharge status: Home / Self Care | Payer: Medicare Other | Source: Ambulatory Visit | Attending: Family Medicine | Admitting: Family Medicine

## 2015-02-21 DIAGNOSIS — Z1231 Encounter for screening mammogram for malignant neoplasm of breast: Secondary | ICD-10-CM | POA: Diagnosis not present

## 2015-04-02 ENCOUNTER — Other Ambulatory Visit: Payer: Self-pay | Admitting: Family Medicine

## 2015-06-18 ENCOUNTER — Telehealth: Payer: Self-pay | Admitting: Family Medicine

## 2015-06-18 DIAGNOSIS — E785 Hyperlipidemia, unspecified: Secondary | ICD-10-CM

## 2015-06-18 DIAGNOSIS — I10 Essential (primary) hypertension: Secondary | ICD-10-CM

## 2015-06-18 DIAGNOSIS — K760 Fatty (change of) liver, not elsewhere classified: Secondary | ICD-10-CM

## 2015-06-18 NOTE — Telephone Encounter (Signed)
Spoke with patient and informed her per Dr.Scott Luking- fasting labs were ordered. Patient verbalized understanding.

## 2015-06-18 NOTE — Telephone Encounter (Signed)
bw orders please  Last labs  12/27/14 Lip, Hep, BMP

## 2015-06-18 NOTE — Telephone Encounter (Signed)
Lipid, liver, metabolic 7 

## 2015-06-25 ENCOUNTER — Encounter: Payer: Self-pay | Admitting: *Deleted

## 2015-06-28 DIAGNOSIS — I10 Essential (primary) hypertension: Secondary | ICD-10-CM | POA: Diagnosis not present

## 2015-06-28 DIAGNOSIS — H35363 Drusen (degenerative) of macula, bilateral: Secondary | ICD-10-CM | POA: Diagnosis not present

## 2015-06-28 DIAGNOSIS — H524 Presbyopia: Secondary | ICD-10-CM | POA: Diagnosis not present

## 2015-06-28 DIAGNOSIS — K760 Fatty (change of) liver, not elsewhere classified: Secondary | ICD-10-CM | POA: Diagnosis not present

## 2015-06-28 DIAGNOSIS — E785 Hyperlipidemia, unspecified: Secondary | ICD-10-CM | POA: Diagnosis not present

## 2015-06-28 DIAGNOSIS — H5203 Hypermetropia, bilateral: Secondary | ICD-10-CM | POA: Diagnosis not present

## 2015-06-28 DIAGNOSIS — H52223 Regular astigmatism, bilateral: Secondary | ICD-10-CM | POA: Diagnosis not present

## 2015-06-29 LAB — LIPID PANEL
CHOL/HDL RATIO: 3.2 ratio (ref 0.0–4.4)
Cholesterol, Total: 126 mg/dL (ref 100–199)
HDL: 40 mg/dL (ref >39)
LDL Calculated: 67 mg/dL (ref 0–99)
Triglycerides: 96 mg/dL (ref 0–149)
VLDL Cholesterol Cal: 19 mg/dL (ref 5–40)

## 2015-06-29 LAB — HEPATIC FUNCTION PANEL
ALT: 6 IU/L (ref 0–32)
AST: 55 IU/L — ABNORMAL HIGH (ref 0–40)
Albumin: 4.1 g/dL (ref 3.5–4.8)
Alkaline Phosphatase: 53 IU/L (ref 39–117)
Bilirubin Total: 0.6 mg/dL (ref 0.0–1.2)
Bilirubin, Direct: 0.15 mg/dL (ref 0.00–0.40)
Total Protein: 7.1 g/dL (ref 6.0–8.5)

## 2015-06-29 LAB — BASIC METABOLIC PANEL
BUN/Creatinine Ratio: 18 (ref 11–26)
BUN: 20 mg/dL (ref 8–27)
CO2: 26 mmol/L (ref 18–29)
Calcium: 9.3 mg/dL (ref 8.7–10.3)
Chloride: 99 mmol/L (ref 96–106)
Creatinine, Ser: 1.09 mg/dL — ABNORMAL HIGH (ref 0.57–1.00)
GFR calc Af Amer: 58 mL/min/{1.73_m2} — ABNORMAL LOW (ref >59)
GFR calc non Af Amer: 50 mL/min/{1.73_m2} — ABNORMAL LOW (ref >59)
Glucose: 96 mg/dL (ref 65–99)
POTASSIUM: 4.4 mmol/L (ref 3.5–5.2)
SODIUM: 141 mmol/L (ref 134–144)

## 2015-07-03 ENCOUNTER — Ambulatory Visit (INDEPENDENT_AMBULATORY_CARE_PROVIDER_SITE_OTHER): Payer: Medicare Other | Admitting: Family Medicine

## 2015-07-03 ENCOUNTER — Encounter: Payer: Self-pay | Admitting: Family Medicine

## 2015-07-03 VITALS — BP 120/78 | Ht 63.0 in | Wt 113.2 lb

## 2015-07-03 DIAGNOSIS — Z1211 Encounter for screening for malignant neoplasm of colon: Secondary | ICD-10-CM

## 2015-07-03 DIAGNOSIS — E785 Hyperlipidemia, unspecified: Secondary | ICD-10-CM

## 2015-07-03 DIAGNOSIS — I10 Essential (primary) hypertension: Secondary | ICD-10-CM

## 2015-07-03 DIAGNOSIS — K760 Fatty (change of) liver, not elsewhere classified: Secondary | ICD-10-CM | POA: Diagnosis not present

## 2015-07-03 MED ORDER — ATENOLOL 50 MG PO TABS: ORAL_TABLET | ORAL | Status: DC

## 2015-07-03 MED ORDER — PRAVASTATIN SODIUM 20 MG PO TABS: 20.0000 mg | ORAL_TABLET | Freq: Every day | ORAL | Status: DC

## 2015-07-03 MED ORDER — LISINOPRIL-HYDROCHLOROTHIAZIDE 20-12.5 MG PO TABS: 1.0000 | ORAL_TABLET | Freq: Every morning | ORAL | Status: DC

## 2015-07-03 MED ORDER — ALENDRONATE SODIUM 70 MG PO TABS: 70.0000 mg | ORAL_TABLET | ORAL | Status: AC

## 2015-07-03 NOTE — Progress Notes (Signed)
   Subjective:    Patient ID: Michelle Mendoza, female    DOB: 07/22/1940, 75 y.o.   MRN: [id number]  Hypertension This is a chronic problem. The current episode started more than 1 year ago. Pertinent negatives include no chest pain. Risk factors for coronary artery disease include dyslipidemia. Treatments tried: zestoretic, atenolol. There are no compliance problems.    Discuss recent labs Lab work was discussed with the patient detail She is taking her blood pressure and cholesterol medicine without problems or conflict She does try to watch her diet She does smoke she know she needs quit She states she has lost some weight because of being very busy and sometimes stress but she feels things are doing better now She denies any rectal bleeding but it has been time for her to get her colonoscopy  Review of Systems  Constitutional: Negative for activity change, appetite change and fatigue.  HENT: Negative for congestion.   Respiratory: Negative for cough.   Cardiovascular: Negative for chest pain.  Gastrointestinal: Negative for abdominal pain.  Endocrine: Negative for polydipsia and polyphagia.  Neurological: Negative for weakness.  Psychiatric/Behavioral: Negative for confusion.       Objective:   Physical Exam  Constitutional: She appears well-nourished. No distress.  Cardiovascular: Normal rate, regular rhythm and normal heart sounds.   No murmur heard. Pulmonary/Chest: Effort normal and breath sounds normal. No respiratory distress.  Musculoskeletal: She exhibits no edema.  Lymphadenopathy:    She has no cervical adenopathy.  Neurological: She is alert. She exhibits normal muscle tone.  Psychiatric: Her behavior is normal.  Vitals reviewed.    25 minutes was spent with the patient. Greater than half the time was spent in discussion and answering questions and counseling regarding the issues that the patient came in for today.      Assessment & Plan:  Hyperlipidemia  doing well with diet taking medication no problems with compliance continue this lab work looks good  Slight elevation of liver enzymes probable fatty liver we talked about doing liver testing and ultrasound patient does not one do this would like to just monitor at this time  Smoking-counseled patient regarding the importance of quitting smoking currently right now patient does not want to stop smoking but she is thinking about possibly uses patches in the future she does not one to do low-dose CAT scan screening to check for any early signs of cancer  She was counseled regarding colonoscopy we will go ahead and work onset and her that up.  Hypertension good control taking current medication not having any problems with this.  Weight loss-counseled on good nutrition and avoiding any additional weight loss

## 2015-12-25 ENCOUNTER — Ambulatory Visit (INDEPENDENT_AMBULATORY_CARE_PROVIDER_SITE_OTHER): Payer: Medicare Other | Admitting: Urology

## 2015-12-25 ENCOUNTER — Telehealth: Payer: Self-pay | Admitting: Family Medicine

## 2015-12-25 DIAGNOSIS — Z79899 Other long term (current) drug therapy: Secondary | ICD-10-CM

## 2015-12-25 DIAGNOSIS — C670 Malignant neoplasm of trigone of bladder: Secondary | ICD-10-CM

## 2015-12-25 DIAGNOSIS — E785 Hyperlipidemia, unspecified: Secondary | ICD-10-CM

## 2015-12-25 NOTE — Telephone Encounter (Signed)
Lipid, liver, metabolic 7 

## 2015-12-25 NOTE — Telephone Encounter (Signed)
Notified patient blood work ordered.

## 2015-12-25 NOTE — Telephone Encounter (Signed)
Pt is requesting lab orders to be sent over. Last labs per epic were: bmp,hepatic,and lipid on 06/28/15

## 2015-12-27 DIAGNOSIS — Z79899 Other long term (current) drug therapy: Secondary | ICD-10-CM | POA: Diagnosis not present

## 2015-12-27 DIAGNOSIS — E785 Hyperlipidemia, unspecified: Secondary | ICD-10-CM | POA: Diagnosis not present

## 2015-12-28 ENCOUNTER — Encounter: Payer: Self-pay | Admitting: Family Medicine

## 2015-12-28 LAB — BASIC METABOLIC PANEL
BUN / CREAT RATIO: 38 — AB (ref 12–28)
BUN: 40 mg/dL — ABNORMAL HIGH (ref 8–27)
CHLORIDE: 99 mmol/L (ref 96–106)
CO2: 22 mmol/L (ref 18–29)
CREATININE: 1.04 mg/dL — AB (ref 0.57–1.00)
Calcium: 9.5 mg/dL (ref 8.7–10.3)
GFR calc Af Amer: 62 mL/min/{1.73_m2} (ref >59)
GFR calc non Af Amer: 53 mL/min/{1.73_m2} — ABNORMAL LOW (ref >59)
GLUCOSE: 99 mg/dL (ref 65–99)
POTASSIUM: 4.8 mmol/L (ref 3.5–5.2)
SODIUM: 140 mmol/L (ref 134–144)

## 2015-12-28 LAB — HEPATIC FUNCTION PANEL
ALK PHOS: 46 IU/L (ref 39–117)
ALT: 6 IU/L (ref 0–32)
AST: 42 IU/L — ABNORMAL HIGH (ref 0–40)
Albumin: 3.8 g/dL (ref 3.5–4.8)
Bilirubin Total: 0.4 mg/dL (ref 0.0–1.2)
Bilirubin, Direct: 0.15 mg/dL (ref 0.00–0.40)
TOTAL PROTEIN: 7.2 g/dL (ref 6.0–8.5)

## 2015-12-28 LAB — LIPID PANEL
Chol/HDL Ratio: 3.1 ratio units (ref 0.0–4.4)
Cholesterol, Total: 105 mg/dL (ref 100–199)
HDL: 34 mg/dL — AB (ref >39)
LDL Calculated: 54 mg/dL (ref 0–99)
TRIGLYCERIDES: 87 mg/dL (ref 0–149)
VLDL CHOLESTEROL CAL: 17 mg/dL (ref 5–40)

## 2016-01-01 ENCOUNTER — Encounter: Payer: Self-pay | Admitting: Family Medicine

## 2016-01-01 ENCOUNTER — Ambulatory Visit (INDEPENDENT_AMBULATORY_CARE_PROVIDER_SITE_OTHER): Payer: Medicare Other | Admitting: Family Medicine

## 2016-01-01 VITALS — BP 100/66 | Ht 63.0 in | Wt 107.0 lb

## 2016-01-01 DIAGNOSIS — M810 Age-related osteoporosis without current pathological fracture: Secondary | ICD-10-CM | POA: Diagnosis not present

## 2016-01-01 DIAGNOSIS — I10 Essential (primary) hypertension: Secondary | ICD-10-CM

## 2016-01-01 DIAGNOSIS — K760 Fatty (change of) liver, not elsewhere classified: Secondary | ICD-10-CM

## 2016-01-01 DIAGNOSIS — G4700 Insomnia, unspecified: Secondary | ICD-10-CM

## 2016-01-01 MED ORDER — LISINOPRIL-HYDROCHLOROTHIAZIDE 20-12.5 MG PO TABS: 1.0000 | ORAL_TABLET | Freq: Every morning | ORAL | 1 refills | Status: DC

## 2016-01-01 MED ORDER — PRAVASTATIN SODIUM 20 MG PO TABS: 20.0000 mg | ORAL_TABLET | Freq: Every day | ORAL | 1 refills | Status: AC

## 2016-01-01 MED ORDER — ATENOLOL 50 MG PO TABS: ORAL_TABLET | ORAL | 3 refills | Status: AC

## 2016-01-01 NOTE — Progress Notes (Signed)
   Subjective:    Patient ID: Michelle Mendoza, female    DOB: December 21, 1940, 75 y.o.   MRN: [id number]  Hypertension  This is a chronic problem. The current episode started more than 1 year ago. The problem has been gradually improving since onset. Pertinent negatives include no chest pain, headaches or shortness of breath. There are no associated agents to hypertension. There are no known risk factors for coronary artery disease. Treatments tried: atenolol. The current treatment provides moderate improvement. There are no compliance problems.    Patient has no concerns at this time.  Patient does smoke some. She does have history of fatty liver She does try to eat somewhat healthy Tries to stay active Has been taking care of her sick husband recently Has history of osteoporosis do due for bone density Flu vaccine recommended patient defers states she will get later this year  Review of Systems  Constitutional: Negative for activity change, fatigue and fever.  Respiratory: Negative for cough and shortness of breath.   Cardiovascular: Negative for chest pain and leg swelling.  Neurological: Negative for headaches.       Objective:   Physical Exam  Constitutional: She appears well-nourished. No distress.  Cardiovascular: Normal rate, regular rhythm and normal heart sounds.   No murmur heard. Pulmonary/Chest: Effort normal and breath sounds normal. No respiratory distress.  Musculoskeletal: She exhibits no edema.  Lymphadenopathy:    She has no cervical adenopathy.  Neurological: She is alert. She exhibits normal muscle tone.  Psychiatric: Her behavior is normal.  Vitals reviewed.    25 minutes was spent with the patient. Greater than half the time was spent in discussion and answering questions and counseling regarding the issues that the patient came in for today.      Assessment & Plan:  HTN watch salt diet blood pressure good stay active avoid smoking  Fatty liver-liver  enzymes slightly elevated monitor in 6 months watch diet stay active Insomnia uses Ambien does well with this continue current measures Osteoporosis bone density test ordered continue medication. Follow-up in 6 months.

## 2016-01-26 DIAGNOSIS — Z23 Encounter for immunization: Secondary | ICD-10-CM | POA: Diagnosis not present

## 2016-03-18 ENCOUNTER — Other Ambulatory Visit: Payer: Self-pay | Admitting: *Deleted

## 2016-03-18 ENCOUNTER — Ambulatory Visit (INDEPENDENT_AMBULATORY_CARE_PROVIDER_SITE_OTHER): Payer: Medicare Other | Admitting: Family Medicine

## 2016-03-18 VITALS — BP 110/70 | Temp 98.8°F

## 2016-03-18 DIAGNOSIS — R490 Dysphonia: Secondary | ICD-10-CM

## 2016-03-18 DIAGNOSIS — J019 Acute sinusitis, unspecified: Secondary | ICD-10-CM

## 2016-03-18 MED ORDER — CLARITHROMYCIN 500 MG PO TABS: 500.0000 mg | ORAL_TABLET | Freq: Two times a day (BID) | ORAL | 0 refills | Status: DC

## 2016-03-18 NOTE — Progress Notes (Unsigned)
BIA

## 2016-03-18 NOTE — Progress Notes (Signed)
This patient has had an onset of some congestion hoarseness a little bit of coughing she is a smoker this is been going on for the past 2 weeks denies high fever chills swPMH smoker examafebrile blood pressure good lungs are clearENT is benign except for hoarseness neck no masses A/P hoarseness with upper respiratory illnesswe will move forward with antibiotics. If the patient does not clear up with this I recommend referral to ENT for inspection of her vocal cords.Although I doubt cancer currently it is certainly something to be considered if the hoarseness continuespatient understands importance of notifying us

## 2016-03-31 ENCOUNTER — Ambulatory Visit: Payer: Medicare Other | Admitting: Family Medicine

## 2016-03-31 ENCOUNTER — Emergency Department (HOSPITAL_COMMUNITY): Payer: Medicare Other

## 2016-03-31 ENCOUNTER — Encounter (HOSPITAL_COMMUNITY): Payer: Self-pay | Admitting: Cardiology

## 2016-03-31 ENCOUNTER — Inpatient Hospital Stay (HOSPITAL_COMMUNITY)
Admission: EM | Admit: 2016-03-31 | Discharge: 2016-04-02 | DRG: 180 | Disposition: A | Discharge status: Home / Self Care | Payer: Medicare Other | Attending: Family Medicine | Admitting: Family Medicine

## 2016-03-31 DIAGNOSIS — I10 Essential (primary) hypertension: Secondary | ICD-10-CM | POA: Diagnosis present

## 2016-03-31 DIAGNOSIS — Z823 Family history of stroke: Secondary | ICD-10-CM | POA: Diagnosis not present

## 2016-03-31 DIAGNOSIS — D638 Anemia in other chronic diseases classified elsewhere: Secondary | ICD-10-CM | POA: Diagnosis present

## 2016-03-31 DIAGNOSIS — F17210 Nicotine dependence, cigarettes, uncomplicated: Secondary | ICD-10-CM | POA: Diagnosis present

## 2016-03-31 DIAGNOSIS — R531 Weakness: Secondary | ICD-10-CM

## 2016-03-31 DIAGNOSIS — C3492 Malignant neoplasm of unspecified part of left bronchus or lung: Secondary | ICD-10-CM

## 2016-03-31 DIAGNOSIS — Z79899 Other long term (current) drug therapy: Secondary | ICD-10-CM

## 2016-03-31 DIAGNOSIS — C799 Secondary malignant neoplasm of unspecified site: Secondary | ICD-10-CM | POA: Diagnosis present

## 2016-03-31 DIAGNOSIS — K760 Fatty (change of) liver, not elsewhere classified: Secondary | ICD-10-CM | POA: Diagnosis present

## 2016-03-31 DIAGNOSIS — Z8249 Family history of ischemic heart disease and other diseases of the circulatory system: Secondary | ICD-10-CM | POA: Diagnosis not present

## 2016-03-31 DIAGNOSIS — Z7982 Long term (current) use of aspirin: Secondary | ICD-10-CM | POA: Diagnosis not present

## 2016-03-31 DIAGNOSIS — D630 Anemia in neoplastic disease: Secondary | ICD-10-CM | POA: Diagnosis present

## 2016-03-31 DIAGNOSIS — Z801 Family history of malignant neoplasm of trachea, bronchus and lung: Secondary | ICD-10-CM

## 2016-03-31 DIAGNOSIS — D649 Anemia, unspecified: Secondary | ICD-10-CM

## 2016-03-31 DIAGNOSIS — Z808 Family history of malignant neoplasm of other organs or systems: Secondary | ICD-10-CM | POA: Diagnosis not present

## 2016-03-31 DIAGNOSIS — E43 Unspecified severe protein-calorie malnutrition: Secondary | ICD-10-CM | POA: Diagnosis present

## 2016-03-31 DIAGNOSIS — C3402 Malignant neoplasm of left main bronchus: Principal | ICD-10-CM | POA: Diagnosis present

## 2016-03-31 DIAGNOSIS — E860 Dehydration: Secondary | ICD-10-CM | POA: Diagnosis not present

## 2016-03-31 DIAGNOSIS — R846 Abnormal cytological findings in specimens from respiratory organs and thorax: Secondary | ICD-10-CM | POA: Diagnosis not present

## 2016-03-31 DIAGNOSIS — M810 Age-related osteoporosis without current pathological fracture: Secondary | ICD-10-CM | POA: Diagnosis present

## 2016-03-31 DIAGNOSIS — Z8551 Personal history of malignant neoplasm of bladder: Secondary | ICD-10-CM

## 2016-03-31 DIAGNOSIS — R739 Hyperglycemia, unspecified: Secondary | ICD-10-CM | POA: Diagnosis present

## 2016-03-31 DIAGNOSIS — R197 Diarrhea, unspecified: Secondary | ICD-10-CM

## 2016-03-31 DIAGNOSIS — N179 Acute kidney failure, unspecified: Secondary | ICD-10-CM | POA: Diagnosis present

## 2016-03-31 DIAGNOSIS — Z880 Allergy status to penicillin: Secondary | ICD-10-CM

## 2016-03-31 DIAGNOSIS — R590 Localized enlarged lymph nodes: Secondary | ICD-10-CM | POA: Diagnosis present

## 2016-03-31 DIAGNOSIS — S299XXA Unspecified injury of thorax, initial encounter: Secondary | ICD-10-CM | POA: Diagnosis not present

## 2016-03-31 DIAGNOSIS — R51 Headache: Secondary | ICD-10-CM | POA: Diagnosis not present

## 2016-03-31 DIAGNOSIS — E785 Hyperlipidemia, unspecified: Secondary | ICD-10-CM | POA: Diagnosis present

## 2016-03-31 DIAGNOSIS — J069 Acute upper respiratory infection, unspecified: Secondary | ICD-10-CM | POA: Diagnosis present

## 2016-03-31 DIAGNOSIS — Z681 Body mass index (BMI) 19 or less, adult: Secondary | ICD-10-CM

## 2016-03-31 DIAGNOSIS — E872 Acidosis, unspecified: Secondary | ICD-10-CM | POA: Diagnosis present

## 2016-03-31 DIAGNOSIS — R918 Other nonspecific abnormal finding of lung field: Secondary | ICD-10-CM | POA: Diagnosis not present

## 2016-03-31 DIAGNOSIS — N281 Cyst of kidney, acquired: Secondary | ICD-10-CM | POA: Diagnosis not present

## 2016-03-31 DIAGNOSIS — R627 Adult failure to thrive: Secondary | ICD-10-CM | POA: Diagnosis present

## 2016-03-31 DIAGNOSIS — M545 Low back pain: Secondary | ICD-10-CM | POA: Diagnosis not present

## 2016-03-31 DIAGNOSIS — Z7983 Long term (current) use of bisphosphonates: Secondary | ICD-10-CM

## 2016-03-31 DIAGNOSIS — R636 Underweight: Secondary | ICD-10-CM | POA: Diagnosis not present

## 2016-03-31 DIAGNOSIS — Z882 Allergy status to sulfonamides status: Secondary | ICD-10-CM | POA: Diagnosis not present

## 2016-03-31 DIAGNOSIS — C3412 Malignant neoplasm of upper lobe, left bronchus or lung: Secondary | ICD-10-CM | POA: Diagnosis not present

## 2016-03-31 DIAGNOSIS — Z66 Do not resuscitate: Secondary | ICD-10-CM | POA: Diagnosis present

## 2016-03-31 HISTORY — DX: Malignant neoplasm of unspecified part of left bronchus or lung: C34.92

## 2016-03-31 HISTORY — DX: Personal history of other diseases of the circulatory system: Z86.79

## 2016-03-31 LAB — BASIC METABOLIC PANEL
ANION GAP: 11 (ref 5–15)
BUN: 54 mg/dL — ABNORMAL HIGH (ref 6–20)
CALCIUM: 11.1 mg/dL — AB (ref 8.9–10.3)
CO2: 25 mmol/L (ref 22–32)
CREATININE: 1.76 mg/dL — AB (ref 0.44–1.00)
Chloride: 98 mmol/L — ABNORMAL LOW (ref 101–111)
GFR, EST AFRICAN AMERICAN: 32 mL/min — AB (ref >60)
GFR, EST NON AFRICAN AMERICAN: 28 mL/min — AB (ref >60)
Glucose, Bld: 168 mg/dL — ABNORMAL HIGH (ref 65–99)
Potassium: 5.1 mmol/L (ref 3.5–5.1)
SODIUM: 134 mmol/L — AB (ref 135–145)

## 2016-03-31 LAB — URINALYSIS, ROUTINE W REFLEX MICROSCOPIC
Bilirubin Urine: NEGATIVE
GLUCOSE, UA: NEGATIVE mg/dL
HGB URINE DIPSTICK: NEGATIVE
KETONES UR: NEGATIVE mg/dL
Leukocytes, UA: NEGATIVE
NITRITE: NEGATIVE
PH: 6 (ref 5.0–8.0)
PROTEIN: NEGATIVE mg/dL
Specific Gravity, Urine: 1.017 (ref 1.005–1.030)

## 2016-03-31 LAB — CBC
HCT: 28.7 % — ABNORMAL LOW (ref 36.0–46.0)
HEMOGLOBIN: 8.8 g/dL — AB (ref 12.0–15.0)
MCH: 26.1 pg (ref 26.0–34.0)
MCHC: 30.7 g/dL (ref 30.0–36.0)
MCV: 85.2 fL (ref 78.0–100.0)
PLATELETS: 451 10*3/uL — AB (ref 150–400)
RBC: 3.37 MIL/uL — AB (ref 3.87–5.11)
RDW: 16.4 % — ABNORMAL HIGH (ref 11.5–15.5)
WBC: 10.8 10*3/uL — AB (ref 4.0–10.5)

## 2016-03-31 LAB — I-STAT CG4 LACTIC ACID, ED
LACTIC ACID, VENOUS: 3.69 mmol/L — AB (ref 0.5–1.9)
LACTIC ACID, VENOUS: 1.09 mmol/L (ref 0.5–1.9)

## 2016-03-31 LAB — TROPONIN I: Troponin I: <0.03 ng/mL (ref <0.03)

## 2016-03-31 LAB — TSH: TSH: 0.127 u[IU]/mL — ABNORMAL LOW (ref 0.350–4.500)

## 2016-03-31 LAB — POC OCCULT BLOOD, ED: FECAL OCCULT BLD: NEGATIVE

## 2016-03-31 MED ORDER — NICOTINE 14 MG/24HR TD PT24
14.0000 mg | MEDICATED_PATCH | Freq: Every day | TRANSDERMAL | Status: DC
  Administered 2016-03-31 – 2016-04-02 (×3): 14 mg via TRANSDERMAL
  Filled 2016-03-31 (×3): qty 1

## 2016-03-31 MED ORDER — ZOLPIDEM TARTRATE 5 MG PO TABS
5.0000 mg | ORAL_TABLET | Freq: Every evening | ORAL | Status: DC | PRN
  Administered 2016-03-31: 5 mg via ORAL
  Filled 2016-03-31: qty 1

## 2016-03-31 MED ORDER — LACTATED RINGERS IV SOLN
INTRAVENOUS | Status: DC
  Administered 2016-03-31 – 2016-04-01 (×4): via INTRAVENOUS

## 2016-03-31 MED ORDER — MORPHINE SULFATE (PF) 2 MG/ML IV SOLN: 2.0000 mg | INTRAVENOUS | Status: DC | PRN

## 2016-03-31 MED ORDER — ENSURE ENLIVE PO LIQD: 237.0000 mL | Freq: Two times a day (BID) | ORAL | Status: DC

## 2016-03-31 MED ORDER — ENOXAPARIN SODIUM 30 MG/0.3ML ~~LOC~~ SOLN
30.0000 mg | SUBCUTANEOUS | Status: DC
  Administered 2016-03-31 – 2016-04-01 (×2): 30 mg via SUBCUTANEOUS
  Filled 2016-03-31 (×2): qty 0.3

## 2016-03-31 MED ORDER — SODIUM CHLORIDE 0.9 % IV BOLUS (SEPSIS)
500.0000 mL | Freq: Once | INTRAVENOUS | Status: AC
  Administered 2016-03-31: 500 mL via INTRAVENOUS

## 2016-03-31 MED ORDER — DOCUSATE SODIUM 100 MG PO CAPS
100.0000 mg | ORAL_CAPSULE | Freq: Two times a day (BID) | ORAL | Status: DC
  Administered 2016-03-31 – 2016-04-02 (×3): 100 mg via ORAL
  Filled 2016-03-31 (×3): qty 1

## 2016-03-31 MED ORDER — ACETAMINOPHEN 325 MG PO TABS
650.0000 mg | ORAL_TABLET | Freq: Four times a day (QID) | ORAL | Status: DC | PRN
  Administered 2016-04-01: 650 mg via ORAL

## 2016-03-31 MED ORDER — OCUVITE-LUTEIN PO CAPS
1.0000 | ORAL_CAPSULE | Freq: Every day | ORAL | Status: DC
  Administered 2016-04-02: 1 via ORAL
  Filled 2016-03-31: qty 1

## 2016-03-31 MED ORDER — SODIUM CHLORIDE 0.9 % IV BOLUS (SEPSIS)
1000.0000 mL | Freq: Once | INTRAVENOUS | Status: AC
  Administered 2016-03-31: 1000 mL via INTRAVENOUS

## 2016-03-31 MED ORDER — ONDANSETRON HCL 4 MG/2ML IJ SOLN: 4.0000 mg | Freq: Four times a day (QID) | INTRAMUSCULAR | Status: DC | PRN

## 2016-03-31 MED ORDER — ONDANSETRON HCL 4 MG PO TABS
4.0000 mg | ORAL_TABLET | Freq: Four times a day (QID) | ORAL | Status: DC | PRN
  Filled 2016-03-31: qty 1

## 2016-03-31 MED ORDER — SODIUM CHLORIDE 0.9 % IV SOLN: Freq: Once | INTRAVENOUS | Status: DC

## 2016-03-31 MED ORDER — PRAVASTATIN SODIUM 10 MG PO TABS
20.0000 mg | ORAL_TABLET | Freq: Every day | ORAL | Status: DC
  Administered 2016-03-31 – 2016-04-02 (×3): 20 mg via ORAL
  Filled 2016-03-31 (×3): qty 2

## 2016-03-31 MED ORDER — ACETAMINOPHEN 650 MG RE SUPP: 650.0000 mg | Freq: Four times a day (QID) | RECTAL | Status: DC | PRN

## 2016-03-31 NOTE — ED Notes (Signed)
Attempted to call report, RN unavailable.

## 2016-03-31 NOTE — H&P (Signed)
History and Physical    Michelle Mendoza KKX:[id number] DOB: February 13, 1941 DOA: 03/31/2016  PCP: Sallee Lange, MD Consultants:  Diona Fanti - urology Patient coming from: home - lives with husband; also has a grown son and daughter who live nearby, daughter is a Marine scientist; NOK: daughter, [phone]; daughtrer - [phone]  Chief Complaint: weak, dizzy  HPI: Michelle Mendoza is a 75 y.o. female with medical history significant of osteoporosis, HTN, HLD, and bladder cancer presenting with feeling light-headed, no energy, dizzy.  Fell last week.  URI/hoarseness x 4 weeks+, 2 different rounds of antibiotics.  Does not seem to be better at all.  Weight loss, 96 lbs today, 2 weeks ago 106, early August 115 or so.  Has gone from size 10 to size 4.  Is trying to drink Boost but continuing to lose weight.  Cough, chronic.  Her father died of lung cancer at 62yo.  Didn't want to go to the doctor 2 weeks ago because she "knows she has lung cancer and is going to die like [name]."  [place] last Tuesday night, daughter (nurse) stayed with her doing neuro checks, no headache.  Saturday after work, more weak.  Sunday, stayed in bed and in pajamas all day, totally out of character.  Had appointment today with Dr. Wolfgang Phoenix, but she was at work today and they called her daughter to come and get her - "your momma looks like a corpse and she is white as a sheet."    ED Course: Per Dr. Reather Converse: Patient presents with general weakness and fatigue. Plan for cardiac and infectious screen including chest x-ray, urine, blood work, IV fluids and thyroid testing. Patient denies any gross blood however patient is anemic plan for type and screen Hemoccult testing. With recent head injury, ct head ordered.  Patient's blood work shows acute renal failure likely dehydration and new anemia which is likely a combination of chronic and possibly malignancy with hilar mass on chest x-ray. Hemoccult-negative no active bleeding.  Repeat fluid bolus  ordered. Lactate elevated. Plan for admission medicine, CT scan of the chest ordered.  The patients results and plan were reviewed and discussed.  Any x-rays performed were independently reviewed by myself.  Differential diagnosis were considered with the presenting HPI.   Review of Systems: As per HPI; otherwise 10 point review of systems reviewed and negative.   Ambulatory Status:  ambualtes without difficulty  Past Medical History:  Diagnosis Date  . Cancer ([place])    bladder  . Elevated transaminase level   . H/O renal artery stenosis   . Hyperlipidemia   . Hypertension   . Osteoporosis     Past Surgical History:  Procedure Laterality Date  . AORTIC VALVULOPLASTY  1995  . CAROTID ENDARTERECTOMY Right   . COLONOSCOPY    . [place]   "blockages" - ?stenting  . TUBAL LIGATION      Social History   Social History  . Marital status: Married    Spouse name: N/A  . Number of children: N/A  . Years of education: N/A   Occupational History  . 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 Smoking status: Current Every Day Smoker    Packs/day: 1.00    Types: Cigarettes  . Smokeless tobacco: Never Used     Comment: currently only 4 cigs/day  . Alcohol use No  . Drug use: No  . Sexual activity: Not on file   Other Topics Concern  .  Not on file   Social History Narrative  . No narrative on file    Allergies  Allergen Reactions  . Penicillins Other (See Comments)    Childhood allergy. Has patient had a PCN reaction causing immediate rash, facial/tongue/throat swelling, SOB or lightheadedness with hypotension: unknown Has patient had a PCN reaction causing severe rash involving mucus membranes or skin necrosis: unknown Has patient had a PCN reaction that required hospitalization: unknown Has patient had a PCN reaction occurring within the last 10 years: unknown If all of the above answers are "NO", then may proceed with Cephalosporin  use.   . Sulfa Antibiotics Other (See Comments)    unknown    Family History  Problem Relation Age of Onset  . Hypertension Sister   . Hypertension Other   . Stroke Other   . Heart attack Other   . Dementia Mother 71    significant ASCVD  . Lung cancer Father 73  . Brain cancer Brother 77    Prior to Admission medications   Medication Sig Start Date End Date Taking? Authorizing Provider  acetaminophen (TYLENOL) 500 MG tablet Take 1,000 mg by mouth every 6 (six) hours as needed for moderate pain.   Yes Historical Provider, MD  alendronate (FOSAMAX) 70 MG tablet Take 1 tablet (70 mg total) by mouth every 7 (seven) days. Take with a full glass of water on an empty stomach sitting upright. 07/03/15  Yes Kathyrn Drown, MD  aspirin 81 MG tablet Take 81 mg by mouth daily.   Yes Historical Provider, MD  atenolol (TENORMIN) 50 MG tablet TAKE (1) TABLET BY MOUTH TWICE DAILY. 01/01/16  Yes Kathyrn Drown, MD  calcium-vitamin D (OSCAL WITH D) 500-200 MG-UNIT per tablet Take 1 tablet by mouth 2 (two) times daily.   Yes Historical Provider, MD  clarithromycin (BIAXIN) 500 MG tablet Take 1 tablet (500 mg total) by mouth 2 (two) times daily. TAKE WITH FOOD 03/18/16  Yes Kathyrn Drown, MD  co-enzyme Q-10 30 MG capsule Take 100 mg by mouth daily.   Yes Historical Provider, MD  Cyanocobalamin (B-12) 250 MCG TABS Take 1 tablet by mouth daily.    Yes Historical Provider, MD  lisinopril-hydrochlorothiazide (PRINZIDE,ZESTORETIC) 20-12.5 MG tablet Take 1 tablet by mouth every morning. 01/01/16  Yes Kathyrn Drown, MD  multivitamin-lutein (OCUVITE-LUTEIN) CAPS capsule Take 1 capsule by mouth daily.   Yes Historical Provider, MD  Omega-3 Fatty Acids (FISH OIL) 1000 MG CAPS Take 1 capsule by mouth daily.   Yes Historical Provider, MD  pravastatin (PRAVACHOL) 20 MG tablet Take 1 tablet (20 mg total) by mouth daily. 01/01/16  Yes Kathyrn Drown, MD  zolpidem (AMBIEN) 5 MG tablet Take 1 tablet (5 mg total) by mouth  at bedtime as needed for sleep. 07/05/14  Yes Kathyrn Drown, MD    Physical Exam: Vitals:   03/31/16 1700 03/31/16 1715 03/31/16 1825 03/31/16 2048  BP: 146/76 138/78 (!) 160/75 120/65  Pulse: 75  75 80  Resp: '19 17 20 18  '$ Temp:   97.6 F (36.4 C) 97.9 F (36.6 C)  TempSrc:   Oral Oral  SpO2: 100%  100% 100%  Weight:      Height:         General: Appears calm but cachectic and is NAD Eyes:  PERRL, EOMI, normal lids, iris ENT:  grossly normal hearing, lips & tongue, mmm Neck:  no LAD, masses or thyromegaly Cardiovascular:  RRR, no m/r/g. No LE edema.  Respiratory:  CTA bilaterally, no w/r/r. Normal respiratory effort. Abdomen:  soft, ntnd, NABS Skin:  no rash or induration seen on limited exam Musculoskeletal:  grossly normal tone BUE/BLE, good ROM, no bony abnormality Psychiatric:  blunted affect, speech fluent and appropriate, AOx3, very stoic Neurologic:  CN 2-12 grossly intact, moves all extremities in coordinated fashion, sensation intact  Labs on Admission: I have personally reviewed following labs and imaging studies  CBC:  Recent Labs Lab 03/31/16 1201  WBC 10.8*  HGB 8.8*  HCT 28.7*  MCV 85.2  PLT 030*   Basic Metabolic Panel:  Recent Labs Lab 03/31/16 1201  NA 134*  K 5.1  CL 98*  CO2 25  GLUCOSE 168*  BUN 54*  CREATININE 1.76*  CALCIUM 11.1*   GFR: Estimated Creatinine Clearance: 18.3 mL/min (by C-G formula based on SCr of 1.76 mg/dL (H)). Liver Function Tests: No results for input(s): AST, ALT, ALKPHOS, BILITOT, PROT, ALBUMIN in the last 168 hours. No results for input(s): LIPASE, AMYLASE in the last 168 hours. No results for input(s): AMMONIA in the last 168 hours. Coagulation Profile: No results for input(s): INR, PROTIME in the last 168 hours. Cardiac Enzymes:  Recent Labs Lab 03/31/16 1306  TROPONINI <0.03   BNP (last 3 results) No results for input(s): PROBNP in the last 8760 hours. HbA1C: No results for input(s): HGBA1C in  the last 72 hours. CBG: No results for input(s): GLUCAP in the last 168 hours. Lipid Profile: No results for input(s): CHOL, HDL, LDLCALC, TRIG, CHOLHDL, LDLDIRECT in the last 72 hours. Thyroid Function Tests:  Recent Labs  03/31/16 1306  TSH 0.127*   Anemia Panel: No results for input(s): VITAMINB12, FOLATE, FERRITIN, TIBC, IRON, RETICCTPCT in the last 72 hours. Urine analysis:    Component Value Date/Time   COLORURINE AMBER (A) 03/31/2016 1242   APPEARANCEUR HAZY (A) 03/31/2016 1242   LABSPEC 1.017 03/31/2016 1242   PHURINE 6.0 03/31/2016 1242   GLUCOSEU NEGATIVE 03/31/2016 1242   HGBUR NEGATIVE 03/31/2016 1242   BILIRUBINUR NEGATIVE 03/31/2016 1242   KETONESUR NEGATIVE 03/31/2016 1242   PROTEINUR NEGATIVE 03/31/2016 1242   UROBILINOGEN 0.2 11/21/2008 1531   NITRITE NEGATIVE 03/31/2016 1242   LEUKOCYTESUR NEGATIVE 03/31/2016 1242    Creatinine Clearance: Estimated Creatinine Clearance: 18.3 mL/min (by C-G formula based on SCr of 1.76 mg/dL (H)).  Sepsis Labs: '@LABRCNTIP'$ (procalcitonin:4,lacticidven:4) ) Recent Results (from the past 240 hour(s))  Blood Culture (routine x 2)     Status: None (Preliminary result)   Collection Time: 03/31/16  1:19 PM  Result Value Ref Range Status   Specimen Description BLOOD RIGHT ARM  Final   Special Requests BOTTLES DRAWN AEROBIC AND ANAEROBIC 6CC  Final   Culture PENDING  Incomplete   Report Status PENDING  Incomplete  Blood Culture (routine x 2)     Status: None (Preliminary result)   Collection Time: 03/31/16  2:53 PM  Result Value Ref Range Status   Specimen Description BLOOD RIGHT FOREARM  Final   Special Requests BOTTLES DRAWN AEROBIC AND ANAEROBIC 6CC  Final   Culture PENDING  Incomplete   Report Status PENDING  Incomplete     Radiological Exams on Admission: Dg Chest 2 View  Result Date: 03/31/2016 CLINICAL DATA:  Weakness.  Back pain.  Fall . EXAM: CHEST  2 VIEW COMPARISON:  CT 11/21/2008. FINDINGS: A very large  left suprahilar infiltrate or mass noted. Pulmonary nodular densities are noted throughout the lungs, with the largest measuring approximately 2 cm and in  the right mid lung. Underlying malignancy should be considered in this patient. IV contrast-enhanced chest CT suggested for further evaluation. Pleural-parenchymal thickening consistent with scarring. No pleural effusion or pneumothorax. Heart size normal. Surgical clips in the neck. Carotid vascular calcification . IMPRESSION: Large left suprahilar infiltrate or mass. Bilateral pulmonary nodules particularly prominent in the right mid lung. These findings are suspicious for malignancy. IV contrast-enhanced chest CT suggest for further evaluation . Electronically Signed   By: Marcello Moores  Register   On: 03/31/2016 14:45   Dg Lumbar Spine Complete  Result Date: 03/31/2016 CLINICAL DATA:  Weakness.  Low back pain . EXAM: LUMBAR SPINE - COMPLETE 4+ VIEW COMPARISON:  No prior . FINDINGS: Surgical clips noted over the abdomen. Thoracolumbar spine scoliosis. Diffuse severe degenerative changes lumbar spine . No evidence of fracture or dislocation. Aortoiliac atherosclerotic vascular disease . IMPRESSION: 1. Surgical clips noted over the abdomen. Scoliosis lumbar spine concave right . Diffuse multilevel degenerative change. No acute abnormality. 2. Aortoiliac atherosclerotic vascular disease . Electronically Signed   By: Marcello Moores  Register   On: 03/31/2016 14:47   Ct Head Wo Contrast  Result Date: 03/31/2016 CLINICAL DATA:  Upper respiratory infection. Fall 1 week ago. Weakness and dizziness. Headache. EXAM: CT HEAD WITHOUT CONTRAST TECHNIQUE: Contiguous axial images were obtained from the base of the skull through the vertex without intravenous contrast. COMPARISON:  None. FINDINGS: Brain: No mass lesion, intraparenchymal hemorrhage or extra-axial collection. No evidence of acute cortical infarct. Focal hypodensity within the right the anterior temporal pole is  favored to be a large perivascular space or neuroepithelial cyst. Otherwise, there is periventricular hypoattenuation compatible with chronic microvascular ischemia. Vascular: Atherosclerotic calcification of the vertebral and internal carotid arteries at the skullbase. Skull: Normal visualized skull base, calvarium and extracranial soft tissues. Sinuses/Orbits: No sinus fluid levels or advanced mucosal thickening. No mastoid effusion. Normal orbits. IMPRESSION: 1. No acute intracranial abnormality. 2. Chronic microvascular ischemia. Electronically Signed   By: Ulyses Jarred M.D.   On: 03/31/2016 14:52   Ct Chest Wo Contrast  Result Date: 03/31/2016 CLINICAL DATA:  75 y/o  F; EXAM: CT CHEST WITHOUT CONTRAST TECHNIQUE: Multidetector CT imaging of the chest was performed following the standard protocol without IV contrast. COMPARISON:  03/31/2016 chest radiograph FINDINGS: Cardiovascular: Aortic atherosclerosis with moderate calcification. The descending thoracic aorta measures up to 36 mm at the hilum (series 5, image 59). Normal caliber main pulmonary artery. Normal heart size. No pericardial effusion. Severe coronary artery calcification. Aortic valvular calcification. Mediastinum/Nodes: Mediastinal lymphadenopathy predominantly at the sub- carinal, right and left lower paratracheal, and AP window stations for example conglomerate precarinal lymph nodes measure approximately 21 x 31 mm (series 2 image 61). Lungs/Pleura: Left suprahilar masslike opacity measures approximately 68 x 37 x 64 mm (AP x ML x CC) series 2, image 49 series 5, image 50. The bronchi extending to the apical posterior probably anterior segments are occluded with atelectasis of the anterior segment. Throughout the apical posterior segment. 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 There are numerous pulmonary nodules throughout the lungs  bilaterally the largest on the right subjacent pleura and upper lobe measuring 12 x 18 mm (series 3 image 86. Upper Abdomen: No acute abnormality. Musculoskeletal: No chest wall mass or suspicious bone lesions identified. IMPRESSION: 1. Left suprahilar mass measuring up to 6.8 cm. There is occlusion of left upper lobe segmental bronchi and distinguishing mass from atelectasis is suboptimal without intravenous contrast. This probably  represents primary lung cancer, possible metastatic disease. 2. Numerous pulmonary nodules in the lungs bilaterally are likely metastatic. 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 3. Lower mediastinal lymphadenopathy is likely metastatic. 4. Aortic valvular calcification associated with aortic stenosis. Severe coronary artery calcification. 5. 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 Recommend annual imaging followup by CTA or MRA. This recommendation follows 2010 ACCF/AHA/AATS/ACR/ASA/SCA/SCAI/SIR/STS/SVM Guidelines for the Diagnosis and Management of Patients with Thoracic Aortic Disease. Circulation.2010; 121: W413-K440 Electronically Signed   By: Kristine Garbe M.D.   On: 03/31/2016 16:28    EKG: Independently reviewed.  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 [facility]) Active Problems:   Weakness   Acute renal failure (HCC)   Lactic acid increased   Hyperglycemia   Anemia of chronic disease   Hypercalcemia   Lung cancer with metastatic disease -While this diagnosis has not been finalized, this is the working diagnosis and the cause for her other acute problems -CT is fairly diagnostic, despite being done without contrast -She does need a tissue diagnosis for further staging and treatment  planning -After long discussion, the patient is too weak to undergo an outpatient evaluative process -Additionally, based on the presence of likely metastatic disease at the time of diagnosis, it would likely significantly delay care to try to obtain outpatient tissue sampling and refer to oncology as an outpatient - and this may result in a poorer prognosis -As such, the patient will be admitted for further evaluation -She is NPO for possible IR-guided biopsy tomorrow -Oncology consult also requested as inpatient -Her hypercalcemia is likely related to malignancy; she is asymptomatic and so this is not addressed at this time -Her weakness is likely the cancer as well as the progressive weight loss; will request nutrition consultation -She has slowed her tobacco use recently due to the illness but is likely to still benefit from a patch as an inpatient  Acute renal failure -Creatinine 1.76, prior 1.04 in 9/17 -Suspect that this is severe chronic volume deficiency in the setting of poor PO intake from anorexia -Will rehydrate and follow -Lactate, which was elevated to 3.69 at the time of presentation, is down to 1.09; this is also likely related to her dehydration  Anemia -Patient with Hgb 8.8, prior was 13 -She is likely hemoconcentrated and with rehydration this may drop even further, likely into the 7 range -She does have apparent symptomatic anemia -She was heme negative in the ER  -It is normocytic and likely anemia of chronic disease -The patient was consented and type/screened for possible transfusion during this hospitalization; however, at the time of the admission her preference was to delay transfusion and monitor her Hgb and symptoms  Hyperglycemia -Glucose 168 -Based on other acute issues at this time, this may be a stress response and is unlikely to be a significant factor in decision-making at this time -For now, will follow without intervention  *I had a prolonged  conversation with the patient, her 2 sisters, her daughter (a Marine scientist), and her granddaughter at the time of admission.  We reviewed in detail all of the results and the very likely metastatic cancer that she is facing.  We discussed the option of simply placing her in observation and rehydrating her for the acute renal failure and then  discharging her, but ultimately were all in agreement that she has other serious medical conditions that require urgent evaluation and/or treatment and that she would be well served by staying as an inpatient for the extent that we are able to facilitate diagnosis of the possible malignancy and possible need for blood transfusion.  DVT prophylaxis: Lovenox Code Status: DNR - confirmed with patient/family Family Communication: 4 family members were present throughout the evaluation  Disposition Plan:  Home once clinically improved Consults called: Oncology, IR, nutrition  Admission status: Admit - It is my clinical opinion that admission to INPATIENT is reasonable and necessary because this patient will require at least 2 midnights in the hospital to treat this condition based on the medical complexity of the problems presented.  Given the aforementioned information, the predictability of an adverse outcome is felt to be significant.    Karmen Bongo MD [facility]  If 7PM-7AM, please contact night-coverage [email or web address] Password [email or web address]  03/31/2016, 10:17 PM

## 2016-03-31 NOTE — ED Provider Notes (Signed)
[facility] DEPT Provider Note   CSN: [id number] Arrival date & time: 03/31/16  1100  By signing my name below, I, Judithe Modest, attest that this documentation has been prepared under the direction and in the presence of Elnora Morrison, MD. Electronically Signed: Judithe Modest, ER Scribe. 11/24/2015. 12:07 PM.  History   Chief Complaint Chief Complaint  Patient presents with  . Weakness   HPI  HPI Comments: GRACEANNA THEISSEN is a 75 y.o. female who presents to the Emergency Department complaining of weakness, light headedness, dizziness, fatigue and back pain for the last 10 days with associated reduced appetite and weight loss. She denies dysuria,  blood in stools, black or tarry stools, diarrhea, skin rashes, fever, chills, CP or SOB. She had a fall one week ago with LOC and mild head injury. She was found on the floor of the bathroom and was did not remember falling. She has been abx twice over the last month for unresponsive sinusitis. She has no PMHx of hypothyroidism.   Past Medical History:  Diagnosis Date  . Cancer ([place])    bladder  . Elevated transaminase level   . Hyperlipidemia   . Hypertension   . Osteoporosis     Patient Active Problem List   Diagnosis Date Noted  . Insomnia 01/01/2016  . Osteoporosis 01/01/2016  . Essential hypertension, benign 01/12/2013  . Fatty liver 01/12/2013  . Hyperlipemia 01/12/2013  . Renal insufficiency 01/12/2013    Past Surgical History:  Procedure Laterality Date  . AORTIC VALVULOPLASTY    . COLONOSCOPY    . KIDNEY SURGERY    . TUBAL LIGATION      OB History    No data available       Home Medications    Prior to Admission medications   Medication Sig Start Date End Date Taking? Authorizing Provider  acetaminophen (TYLENOL) 500 MG tablet Take 1,000 mg by mouth every 6 (six) hours as needed for moderate pain.   Yes Historical Provider, MD  alendronate (FOSAMAX) 70 MG tablet Take 1 tablet (70 mg total) by  mouth every 7 (seven) days. Take with a full glass of water on an empty stomach sitting upright. 07/03/15  Yes Kathyrn Drown, MD  aspirin 81 MG tablet Take 81 mg by mouth daily.   Yes Historical Provider, MD  atenolol (TENORMIN) 50 MG tablet TAKE (1) TABLET BY MOUTH TWICE DAILY. 01/01/16  Yes Kathyrn Drown, MD  calcium-vitamin D (OSCAL WITH D) 500-200 MG-UNIT per tablet Take 1 tablet by mouth 2 (two) times daily.   Yes Historical Provider, MD  clarithromycin (BIAXIN) 500 MG tablet Take 1 tablet (500 mg total) by mouth 2 (two) times daily. TAKE WITH FOOD 03/18/16  Yes Kathyrn Drown, MD  co-enzyme Q-10 30 MG capsule Take 100 mg by mouth daily.   Yes Historical Provider, MD  Cyanocobalamin (B-12) 250 MCG TABS Take 1 tablet by mouth daily.    Yes Historical Provider, MD  lisinopril-hydrochlorothiazide (PRINZIDE,ZESTORETIC) 20-12.5 MG tablet Take 1 tablet by mouth every morning. 01/01/16  Yes Kathyrn Drown, MD  multivitamin-lutein (OCUVITE-LUTEIN) CAPS capsule Take 1 capsule by mouth daily.   Yes Historical Provider, MD  Omega-3 Fatty Acids (FISH OIL) 1000 MG CAPS Take 1 capsule by mouth daily.   Yes Historical Provider, MD  pravastatin (PRAVACHOL) 20 MG tablet Take 1 tablet (20 mg total) by mouth daily. 01/01/16  Yes Kathyrn Drown, MD  zolpidem (AMBIEN) 5 MG tablet Take 1 tablet (  5 mg total) by mouth at bedtime as needed for sleep. 07/05/14  Yes Kathyrn Drown, MD    Family History Family History  Problem Relation Age of Onset  . Hypertension Sister   . Hypertension Other   . Stroke Other   . Heart attack Other     Social History Social History  Substance Use Topics  . Smoking status: Current Every Day Smoker    Types: Cigarettes  . Smokeless tobacco: Never Used  . Alcohol use No     Allergies   Penicillins and Sulfa antibiotics   Review of Systems Review of Systems  Constitutional: Positive for appetite change and fatigue. Negative for chills and fever.  HENT: Negative for ear  pain and sore throat.   Eyes: Negative for pain and visual disturbance.  Respiratory: Positive for cough. Negative for shortness of breath.   Cardiovascular: Negative for chest pain and palpitations.  Gastrointestinal: Negative for abdominal pain and vomiting.  Genitourinary: Negative for dysuria and hematuria.  Musculoskeletal: Positive for back pain. Negative for arthralgias.  Skin: Negative for color change and rash.  Neurological: Positive for dizziness, weakness, light-headedness and headaches. Negative for seizures and syncope.  All other systems reviewed and are negative.  At least 10pt or greater review of systems completed and are negative except where specified in the HPI.   Physical Exam Updated Vital Signs BP 144/74   Pulse 77   Temp 97.9 F (36.6 C) (Oral)   Resp 19   Ht 5' 2.5" (1.588 m)   Wt 90 lb (40.8 kg)   SpO2 99%   BMI 16.20 kg/m   Physical Exam  Constitutional: She is oriented to person, place, and time. She appears well-developed and well-nourished. No distress.  Generalized weakness and fatigue  HENT:  Head: Normocephalic and atraumatic.  Dry mm  Eyes: Pupils are equal, round, and reactive to light.  Neck: Neck supple.  Cardiovascular: Normal rate.    No extremity edema  Pulmonary/Chest: Effort normal. No respiratory distress.  Abdominal: There is no tenderness.  Musculoskeletal: Normal range of motion.  No flank tenderness. TTP in the lumbar paraspinal region  Neurological: She is alert and oriented to person, place, and time. No cranial nerve deficit. Coordination normal. GCS eye subscore is 4. GCS verbal subscore is 5. GCS motor subscore is 6.  gen weakness in all ext, equal in all ext  Skin: Skin is warm and dry. She is not diaphoretic. There is pallor.  Psychiatric: Her behavior is normal.  Nursing note and vitals reviewed.    ED Treatments / Results  Labs (all labs ordered are listed, but only abnormal results are displayed) Labs  Reviewed  BASIC METABOLIC PANEL - Abnormal; Notable for the following:       Result Value   Sodium 134 (*)    Chloride 98 (*)    Glucose, Bld 168 (*)    BUN 54 (*)    Creatinine, Ser 1.76 (*)    Calcium 11.1 (*)    GFR calc non Af Amer 28 (*)    GFR calc Af Amer 32 (*)    All other components within normal limits  CBC - Abnormal; Notable for the following:    WBC 10.8 (*)    RBC 3.37 (*)    Hemoglobin 8.8 (*)    HCT 28.7 (*)    RDW 16.4 (*)    Platelets 451 (*)    All other components within normal limits  TSH - Abnormal; Notable  for the following:    TSH 0.127 (*)    All other components within normal limits  I-STAT CG4 LACTIC ACID, ED - Abnormal; Notable for the following:    Lactic Acid, Venous 3.69 (*)    All other components within normal limits  URINE CULTURE  CULTURE, BLOOD (ROUTINE X 2)  CULTURE, BLOOD (ROUTINE X 2)  TROPONIN I  URINALYSIS, ROUTINE W REFLEX MICROSCOPIC  OCCULT BLOOD X 1 CARD TO LAB, STOOL  POC OCCULT BLOOD, ED  TYPE AND SCREEN    EKG  EKG Interpretation None       Radiology Dg Chest 2 View  Result Date: 03/31/2016 CLINICAL DATA:  Weakness.  Back pain.  Fall . EXAM: CHEST  2 VIEW COMPARISON:  CT 11/21/2008. FINDINGS: A very large left suprahilar infiltrate or mass noted. Pulmonary nodular densities are noted throughout the lungs, with the largest measuring approximately 2 cm and in the right mid lung. Underlying malignancy should be considered in this patient. IV contrast-enhanced chest CT suggested for further evaluation. Pleural-parenchymal thickening consistent with scarring. No pleural effusion or pneumothorax. Heart size normal. Surgical clips in the neck. Carotid vascular calcification . IMPRESSION: Large left suprahilar infiltrate or mass. Bilateral pulmonary nodules particularly prominent in the right mid lung. These findings are suspicious for malignancy. IV contrast-enhanced chest CT suggest for further evaluation . Electronically  Signed   By: Marcello Moores  Register   On: 03/31/2016 14:45   Dg Lumbar Spine Complete  Result Date: 03/31/2016 CLINICAL DATA:  Weakness.  Low back pain . EXAM: LUMBAR SPINE - COMPLETE 4+ VIEW COMPARISON:  No prior . FINDINGS: Surgical clips noted over the abdomen. Thoracolumbar spine scoliosis. Diffuse severe degenerative changes lumbar spine . No evidence of fracture or dislocation. Aortoiliac atherosclerotic vascular disease . IMPRESSION: 1. Surgical clips noted over the abdomen. Scoliosis lumbar spine concave right . Diffuse multilevel degenerative change. No acute abnormality. 2. Aortoiliac atherosclerotic vascular disease . Electronically Signed   By: Marcello Moores  Register   On: 03/31/2016 14:47   Ct Head Wo Contrast  Result Date: 03/31/2016 CLINICAL DATA:  Upper respiratory infection. Fall 1 week ago. Weakness and dizziness. Headache. EXAM: CT HEAD WITHOUT CONTRAST TECHNIQUE: Contiguous axial images were obtained from the base of the skull through the vertex without intravenous contrast. COMPARISON:  None. FINDINGS: Brain: No mass lesion, intraparenchymal hemorrhage or extra-axial collection. No evidence of acute cortical infarct. Focal hypodensity within the right the anterior temporal pole is favored to be a large perivascular space or neuroepithelial cyst. Otherwise, there is periventricular hypoattenuation compatible with chronic microvascular ischemia. Vascular: Atherosclerotic calcification of the vertebral and internal carotid arteries at the skullbase. Skull: Normal visualized skull base, calvarium and extracranial soft tissues. Sinuses/Orbits: No sinus fluid levels or advanced mucosal thickening. No mastoid effusion. Normal orbits. IMPRESSION: 1. No acute intracranial abnormality. 2. Chronic microvascular ischemia. Electronically Signed   By: Ulyses Jarred M.D.   On: 03/31/2016 14:52    Procedures Procedures (including critical care time)  Medications Ordered in ED Medications  sodium chloride  0.9 % bolus 1,000 mL (0 mLs Intravenous Stopped 03/31/16 1315)  sodium chloride 0.9 % bolus 500 mL (500 mLs Intravenous New Bag/Given 03/31/16 1339)     Initial Impression / Assessment and Plan / ED Course  I have reviewed the triage vital signs and the nursing notes.  Pertinent labs & imaging results that were available during my care of the patient were reviewed by me and considered in my medical decision making (see chart  for details).  Clinical Course    Patient presents with general weakness and fatigue. Plan for cardiac and infectious screen including chest x-ray, urine, blood work, IV fluids and thyroid testing. Patient denies any gross blood however patient is anemic plan for type and screen Hemoccult testing. With recent head injury, ct head ordered.   Patient's blood work shows acute renal failure likely dehydration and new anemia which is likely a combination of chronic and possibly malignancy with hilar mass on chest x-ray.  Hemoccult-negative no active bleeding.  Repeat fluid bolus ordered. Lactate elevated. Plan for admission medicine, CT scan of the chest ordered.  The patients results and plan were reviewed and discussed.   Any x-rays performed were independently reviewed by myself.   Differential diagnosis were considered with the presenting HPI.  Medications  sodium chloride 0.9 % bolus 1,000 mL (0 mLs Intravenous Stopped 03/31/16 1315)  sodium chloride 0.9 % bolus 500 mL (500 mLs Intravenous New Bag/Given 03/31/16 1339)    Vitals:   03/31/16 1330 03/31/16 1345 03/31/16 1415 03/31/16 1430  BP:  124/65 (!) 119/51 144/74  Pulse: 72 73 75 77  Resp: '20 21 19 19  '$ Temp:      TempSrc:      SpO2: 98% 98% 100% 99%  Weight:      Height:        Final diagnoses:  Generalized weakness  Anemia, unspecified type  Acute renal failure, unspecified acute renal failure type (HCC)  Diarrhea, unspecified type  Dehydration  Hilar mass    Admission/ observation were  discussed with the admitting physician, patient and/or family and they are comfortable with the plan.    Final Clinical Impressions(s) / ED Diagnoses   Final diagnoses:  Generalized weakness  Anemia, unspecified type  Acute renal failure, unspecified acute renal failure type (HCC)  Diarrhea, unspecified type  Dehydration  Hilar mass    New Prescriptions New Prescriptions   No medications on file        Elnora Morrison, MD 03/31/16 1534

## 2016-03-31 NOTE — ED Triage Notes (Addendum)
Has had an URI for the past month.  Been on 2 different antibiodics.  Pt fell last Tuesday at home.  Since falling , pt has had weakness and dizziness.  States this morning that she feels like she is going to pass out.  C/o headache and back pain today.

## 2016-04-01 ENCOUNTER — Inpatient Hospital Stay (HOSPITAL_COMMUNITY): Payer: Medicare Other

## 2016-04-01 ENCOUNTER — Other Ambulatory Visit (HOSPITAL_COMMUNITY): Payer: Self-pay | Admitting: Oncology

## 2016-04-01 DIAGNOSIS — E43 Unspecified severe protein-calorie malnutrition: Secondary | ICD-10-CM | POA: Insufficient documentation

## 2016-04-01 LAB — BASIC METABOLIC PANEL
ANION GAP: 7 (ref 5–15)
BUN: 36 mg/dL — ABNORMAL HIGH (ref 6–20)
CHLORIDE: 105 mmol/L (ref 101–111)
CO2: 22 mmol/L (ref 22–32)
Calcium: 9.6 mg/dL (ref 8.9–10.3)
Creatinine, Ser: 1.11 mg/dL — ABNORMAL HIGH (ref 0.44–1.00)
GFR calc non Af Amer: 48 mL/min — ABNORMAL LOW (ref >60)
GFR, EST AFRICAN AMERICAN: 56 mL/min — AB (ref >60)
Glucose, Bld: 93 mg/dL (ref 65–99)
Potassium: 4.8 mmol/L (ref 3.5–5.1)
Sodium: 134 mmol/L — ABNORMAL LOW (ref 135–145)

## 2016-04-01 LAB — ABO/RH: ABO/RH(D): O NEG

## 2016-04-01 LAB — CBC
HCT: 24.3 % — ABNORMAL LOW (ref 36.0–46.0)
HEMOGLOBIN: 7.4 g/dL — AB (ref 12.0–15.0)
MCH: 25.7 pg — ABNORMAL LOW (ref 26.0–34.0)
MCHC: 30.5 g/dL (ref 30.0–36.0)
MCV: 84.4 fL (ref 78.0–100.0)
Platelets: 398 10*3/uL (ref 150–400)
RBC: 2.88 MIL/uL — AB (ref 3.87–5.11)
RDW: 16.2 % — ABNORMAL HIGH (ref 11.5–15.5)
WBC: 10.5 10*3/uL (ref 4.0–10.5)

## 2016-04-01 LAB — PROTIME-INR
INR: 1.2
Prothrombin Time: 15.3 seconds — ABNORMAL HIGH (ref 11.4–15.2)

## 2016-04-01 LAB — RETICULOCYTES
RBC.: 2.82 MIL/uL — AB (ref 3.87–5.11)
RETIC COUNT ABSOLUTE: 73.3 10*3/uL (ref 19.0–186.0)
RETIC CT PCT: 2.6 % (ref 0.4–3.1)

## 2016-04-01 LAB — PREPARE RBC (CROSSMATCH)

## 2016-04-01 MED ORDER — DIPHENHYDRAMINE HCL 25 MG PO CAPS: 25.0000 mg | ORAL_CAPSULE | Freq: Every evening | ORAL | Status: DC | PRN

## 2016-04-01 MED ORDER — SODIUM CHLORIDE 0.9 % IV SOLN
Freq: Once | INTRAVENOUS | Status: AC
  Administered 2016-04-01: 22:00:00 via INTRAVENOUS

## 2016-04-01 MED ORDER — BOOST PLUS PO LIQD
237.0000 mL | Freq: Two times a day (BID) | ORAL | Status: DC
  Administered 2016-04-01: 237 mL via ORAL
  Filled 2016-04-01 (×4): qty 237

## 2016-04-01 MED ORDER — CHLORHEXIDINE GLUCONATE CLOTH 2 % EX PADS: 6.0000 | MEDICATED_PAD | Freq: Once | CUTANEOUS | Status: DC

## 2016-04-01 MED ORDER — ACETAMINOPHEN 325 MG PO TABS: 325.0000 mg | ORAL_TABLET | Freq: Every evening | ORAL | Status: DC | PRN

## 2016-04-01 MED ORDER — BOOST PLUS PO LIQD
237.0000 mL | Freq: Two times a day (BID) | ORAL | Status: DC
  Filled 2016-04-01 (×3): qty 237

## 2016-04-01 MED ORDER — ACETAMINOPHEN 325 MG PO TABS
650.0000 mg | ORAL_TABLET | Freq: Once | ORAL | Status: AC
  Administered 2016-04-01: 650 mg via ORAL
  Filled 2016-04-01: qty 2

## 2016-04-01 MED ORDER — IOPAMIDOL (ISOVUE-300) INJECTION 61%
75.0000 mL | Freq: Once | INTRAVENOUS | Status: AC | PRN
  Administered 2016-04-01: 75 mL via INTRAVENOUS

## 2016-04-01 MED ORDER — BOOST / RESOURCE BREEZE PO LIQD
1.0000 | ORAL | Status: DC
  Administered 2016-04-01: 1 via ORAL

## 2016-04-01 MED ORDER — CHLORHEXIDINE GLUCONATE CLOTH 2 % EX PADS
6.0000 | MEDICATED_PAD | Freq: Once | CUTANEOUS | Status: AC
  Administered 2016-04-01: 6 via TOPICAL

## 2016-04-01 MED ORDER — IOPAMIDOL (ISOVUE-300) INJECTION 61%
INTRAVENOUS | Status: AC
  Filled 2016-04-01: qty 30

## 2016-04-01 MED ORDER — DIPHENHYDRAMINE HCL 50 MG/ML IJ SOLN
25.0000 mg | Freq: Once | INTRAMUSCULAR | Status: AC
  Administered 2016-04-01: 25 mg via INTRAVENOUS
  Filled 2016-04-01: qty 1

## 2016-04-01 NOTE — Consult Note (Signed)
[facility] Consultation Oncology  Name: Michelle Mendoza      MRN: [id number]    Location: G992/E268-34  Date: 04/01/2016 Time:5:53 PM   REFERRING PHYSICIAN:  Karmen Bongo, MD ([facility])  REASON FOR CONSULT:  "Likely metastatic lung cancer, new onset"   DIAGNOSIS:  Left suprahilar mass, 6.8 cm with occlusion of LUL segmental bronchi, numerous pulmonary nodules bilaterally, lower mediastinal lymphadenopathy.  HISTORY OF PRESENT ILLNESS:  75 yo white American with a past medical history significant for HTN, fatty liver, hyperlipidemia, renal insufficiency, osteoporosis who is admitted to the [facility] after presenting to the ED on 03/31/2016 with weakness and work-up demonstrating concerns for bronchogenic carcinoma.  She presented with feeling light-headed, no energy, dizzy.  Fell last week.  URI/hoarseness x 4 weeks+, 2 different rounds of antibiotics.  Does not seem to be better at all.  Weight loss, 96 lbs today, 2 weeks ago 106, early August 115 or so.  Has gone from size 10 to size 4.  Is trying to drink Boost but continuing to lose weight.  Cough, chronic.  Her father died of lung cancer at 68yo.  Didn't want to go to the doctor 2 weeks ago because she "knows she has lung cancer and is going to die like Michelle Mendoza."  [place] last Tuesday night, daughter (nurse) stayed with her doing neuro checks, no headache.  Saturday after work, more weak.  Sunday, stayed in bed and in pajamas all day, totally out of character.  Had appointment today with Dr. Wolfgang Phoenix, but she was at work today and they called her daughter to come and get her - "your momma looks like a corpse and she is white as a sheet."  The patient confirms the aforementioned story.  She notes ongoing weight loss and was advised by her primary care provider to work on stabilizing in improving her weight.  She notes a decrease in appetite.  She denies hemoptysis, fevers, chills.  She denies increase in shortness of  breath.    She notes that her husband recently had back surgery, so she has been tending to him.   Review of Systems  Constitutional: Positive for weight loss. Negative for chills and fever.  HENT: Negative.   Eyes: Negative.  Negative for blurred vision and double vision.  Respiratory: Positive for shortness of breath. Negative for cough and hemoptysis.   Cardiovascular: Negative.  Negative for chest pain.  Gastrointestinal: Negative.  Negative for abdominal pain, nausea and vomiting.  Genitourinary: Negative.   Musculoskeletal: Positive for falls.  Skin: Negative.   Neurological: Positive for dizziness and weakness.  Endo/Heme/Allergies: Negative.   Psychiatric/Behavioral: Positive for substance abuse (tobacco).    PAST MEDICAL HISTORY:   Past Medical History:  Diagnosis Date  . Cancer ([place])    bladder  . Elevated transaminase level   . H/O renal artery stenosis   . Hyperlipidemia   . Hypertension   . Osteoporosis     ALLERGIES: Allergies  Allergen Reactions  . Penicillins Other (See Comments)    Childhood allergy. Has patient had a PCN reaction causing immediate rash, facial/tongue/throat swelling, SOB or lightheadedness with hypotension: unknown Has patient had a PCN reaction causing severe rash involving mucus membranes or skin necrosis: unknown Has patient had a PCN reaction that required hospitalization: unknown Has patient had a PCN reaction occurring within the last 10 years: unknown If all of the above answers are "NO", then may proceed with Cephalosporin use.   . Sulfa Antibiotics Other (  See Comments)    unknown      MEDICATIONS: I have reviewed the patient's current medications.     PAST SURGICAL HISTORY Past Surgical History:  Procedure Laterality Date  . AORTIC VALVULOPLASTY  1995  . CAROTID ENDARTERECTOMY Right   . COLONOSCOPY    . [place]   "blockages" - ?stenting  . TUBAL LIGATION      FAMILY HISTORY: Family History  Problem  Relation Age of Onset  . Hypertension Sister   . Hypertension Other   . Stroke Other   . Heart attack Other   . Dementia Mother 56    significant ASCVD  . Lung cancer Father 25  . Brain cancer Brother 49    SOCIAL HISTORY: She reports 0.5 ppd smoking history x 30-40 years.  She denies EtOH and illicit drug abuse.  She is [place] in religion.  She is married with children.  PERFORMANCE STATUS: The patient's performance status is 2 - Symptomatic, <50% confined to bed  PHYSICAL EXAM: Most Recent Vital Signs: Blood pressure (!) 131/57, pulse 79, temperature 98.6 F (37 C), temperature source Oral, resp. rate 20, height 5' 2.5" (1.588 m), weight 90 lb (40.8 kg), SpO2 99 %. General appearance: alert, cooperative, appears stated age, cachectic, no distress and accompanied by pastor and pastor's husband Head: Normocephalic, without obvious abnormality, atraumatic Throat: normal findings: oropharynx pink & moist without lesions or evidence of thrush Lungs: diminished breath sounds bilaterally Heart: regular rate and rhythm Abdomen: normal findings: bowel sounds normal Skin: Pale Neurologic: Grossly normal  LABORATORY DATA:  Results for orders placed or performed during the hospital encounter of 03/31/16 (from the past 48 hour(s))  Basic metabolic panel     Status: Abnormal   Collection Time: 03/31/16 12:01 PM  Result Value Ref Range   Sodium 134 (L) 135 - 145 mmol/L   Potassium 5.1 3.5 - 5.1 mmol/L   Chloride 98 (L) 101 - 111 mmol/L   CO2 25 22 - 32 mmol/L   Glucose, Bld 168 (H) 65 - 99 mg/dL   BUN 54 (H) 6 - 20 mg/dL   Creatinine, Ser 1.76 (H) 0.44 - 1.00 mg/dL   Calcium 11.1 (H) 8.9 - 10.3 mg/dL   GFR calc non Af Amer 28 (L) >60 mL/min   GFR calc Af Amer 32 (L) >60 mL/min    Comment: (NOTE) The eGFR has been calculated using the CKD EPI equation. This calculation has not been validated in all clinical situations. eGFR's persistently <60 mL/min signify possible Chronic  Kidney Disease.    Anion gap 11 5 - 15  CBC     Status: Abnormal   Collection Time: 03/31/16 12:01 PM  Result Value Ref Range   WBC 10.8 (H) 4.0 - 10.5 K/uL   RBC 3.37 (L) 3.87 - 5.11 MIL/uL   Hemoglobin 8.8 (L) 12.0 - 15.0 g/dL   HCT 28.7 (L) 36.0 - 46.0 %   MCV 85.2 78.0 - 100.0 fL   MCH 26.1 26.0 - 34.0 pg   MCHC 30.7 30.0 - 36.0 g/dL   RDW 16.4 (H) 11.5 - 15.5 %   Platelets 451 (H) 150 - 400 K/uL  Urinalysis, Routine w reflex microscopic     Status: Abnormal   Collection Time: 03/31/16 12:42 PM  Result Value Ref Range   Color, Urine AMBER (A) YELLOW    Comment: BIOCHEMICALS MAY BE AFFECTED BY COLOR   APPearance HAZY (A) CLEAR   Specific Gravity, Urine 1.017 1.005 -  1.030   pH 6.0 5.0 - 8.0   Glucose, UA NEGATIVE NEGATIVE mg/dL   Hgb urine dipstick NEGATIVE NEGATIVE   Bilirubin Urine NEGATIVE NEGATIVE   Ketones, ur NEGATIVE NEGATIVE mg/dL   Protein, ur NEGATIVE NEGATIVE mg/dL   Nitrite NEGATIVE NEGATIVE   Leukocytes, UA NEGATIVE NEGATIVE   RBC / HPF 0-5 0 - 5 RBC/hpf   WBC, UA 0-5 0 - 5 WBC/hpf   Bacteria, UA RARE (A) NONE SEEN   Mucous PRESENT    Hyaline Casts, UA PRESENT   POC occult blood, ED     Status: None   Collection Time: 03/31/16  1:00 PM  Result Value Ref Range   Fecal Occult Bld NEGATIVE NEGATIVE  TSH     Status: Abnormal   Collection Time: 03/31/16  1:06 PM  Result Value Ref Range   TSH 0.127 (L) 0.350 - 4.500 uIU/mL    Comment: Performed by a 3rd Generation assay with a functional sensitivity of <=0.01 uIU/mL.  Troponin I     Status: None   Collection Time: 03/31/16  1:06 PM  Result Value Ref Range   Troponin I <0.03 <0.03 ng/mL  Type and screen     Status: None   Collection Time: 03/31/16  1:10 PM  Result Value Ref Range   ABO/RH(D) O NEG    Antibody Screen NEG    Sample Expiration 04/03/2016   Blood Culture (routine x 2)     Status: None (Preliminary result)   Collection Time: 03/31/16  1:19 PM  Result Value Ref Range   Specimen Description  BLOOD RIGHT ARM    Special Requests BOTTLES DRAWN AEROBIC AND ANAEROBIC 6CC    Culture NO GROWTH < 24 HOURS    Report Status PENDING   I-Stat CG4 Lactic Acid, ED  (not at  [facility])     Status: Abnormal   Collection Time: 03/31/16  1:36 PM  Result Value Ref Range   Lactic Acid, Venous 3.69 (HH) 0.5 - 1.9 mmol/L   Comment NOTIFIED PHYSICIAN   Blood Culture (routine x 2)     Status: None (Preliminary result)   Collection Time: 03/31/16  2:53 PM  Result Value Ref Range   Specimen Description BLOOD RIGHT FOREARM    Special Requests BOTTLES DRAWN AEROBIC AND ANAEROBIC 6CC    Culture NO GROWTH < 24 HOURS    Report Status PENDING   I-Stat CG4 Lactic Acid, ED  (not at  [facility])     Status: None   Collection Time: 03/31/16  4:48 PM  Result Value Ref Range   Lactic Acid, Venous 1.09 0.5 - 1.9 mmol/L  Basic metabolic panel     Status: Abnormal   Collection Time: 04/01/16  4:16 AM  Result Value Ref Range   Sodium 134 (L) 135 - 145 mmol/L   Potassium 4.8 3.5 - 5.1 mmol/L   Chloride 105 101 - 111 mmol/L   CO2 22 22 - 32 mmol/L   Glucose, Bld 93 65 - 99 mg/dL   BUN 36 (H) 6 - 20 mg/dL   Creatinine, Ser 1.11 (H) 0.44 - 1.00 mg/dL   Calcium 9.6 8.9 - 10.3 mg/dL   GFR calc non Af Amer 48 (L) >60 mL/min   GFR calc Af Amer 56 (L) >60 mL/min    Comment: (NOTE) The eGFR has been calculated using the CKD EPI equation. This calculation has not been validated in all clinical situations. eGFR's persistently <60 mL/min signify possible Chronic Kidney Disease.  Anion gap 7 5 - 15  CBC     Status: Abnormal   Collection Time: 04/01/16  4:16 AM  Result Value Ref Range   WBC 10.5 4.0 - 10.5 K/uL   RBC 2.88 (L) 3.87 - 5.11 MIL/uL   Hemoglobin 7.4 (L) 12.0 - 15.0 g/dL   HCT 24.3 (L) 36.0 - 46.0 %   MCV 84.4 78.0 - 100.0 fL   MCH 25.7 (L) 26.0 - 34.0 pg   MCHC 30.5 30.0 - 36.0 g/dL   RDW 16.2 (H) 11.5 - 15.5 %   Platelets 398 150 - 400 K/uL  Protime-INR     Status: Abnormal   Collection Time: 04/01/16   4:16 AM  Result Value Ref Range   Prothrombin Time 15.3 (H) 11.4 - 15.2 seconds   INR 1.20       RADIOGRAPHY: Dg Chest 2 View  Result Date: 03/31/2016 CLINICAL DATA:  Weakness.  Back pain.  Fall . EXAM: CHEST  2 VIEW COMPARISON:  CT 11/21/2008. FINDINGS: A very large left suprahilar infiltrate or mass noted. Pulmonary nodular densities are noted throughout the lungs, with the largest measuring approximately 2 cm and in the right mid lung. Underlying malignancy should be considered in this patient. IV contrast-enhanced chest CT suggested for further evaluation. Pleural-parenchymal thickening consistent with scarring. No pleural effusion or pneumothorax. Heart size normal. Surgical clips in the neck. Carotid vascular calcification . IMPRESSION: Large left suprahilar infiltrate or mass. Bilateral pulmonary nodules particularly prominent in the right mid lung. These findings are suspicious for malignancy. IV contrast-enhanced chest CT suggest for further evaluation . Electronically Signed   By: Marcello Moores  Register   On: 03/31/2016 14:45   Dg Lumbar Spine Complete  Result Date: 03/31/2016 CLINICAL DATA:  Weakness.  Low back pain . EXAM: LUMBAR SPINE - COMPLETE 4+ VIEW COMPARISON:  No prior . FINDINGS: Surgical clips noted over the abdomen. Thoracolumbar spine scoliosis. Diffuse severe degenerative changes lumbar spine . No evidence of fracture or dislocation. Aortoiliac atherosclerotic vascular disease . IMPRESSION: 1. Surgical clips noted over the abdomen. Scoliosis lumbar spine concave right . Diffuse multilevel degenerative change. No acute abnormality. 2. Aortoiliac atherosclerotic vascular disease . Electronically Signed   By: Marcello Moores  Register   On: 03/31/2016 14:47   Ct Head Wo Contrast  Result Date: 03/31/2016 CLINICAL DATA:  Upper respiratory infection. Fall 1 week ago. Weakness and dizziness. Headache. EXAM: CT HEAD WITHOUT CONTRAST TECHNIQUE: Contiguous axial images were obtained from the base  of the skull through the vertex without intravenous contrast. COMPARISON:  None. FINDINGS: Brain: No mass lesion, intraparenchymal hemorrhage or extra-axial collection. No evidence of acute cortical infarct. Focal hypodensity within the right the anterior temporal pole is favored to be a large perivascular space or neuroepithelial cyst. Otherwise, there is periventricular hypoattenuation compatible with chronic microvascular ischemia. Vascular: Atherosclerotic calcification of the vertebral and internal carotid arteries at the skullbase. Skull: Normal visualized skull base, calvarium and extracranial soft tissues. Sinuses/Orbits: No sinus fluid levels or advanced mucosal thickening. No mastoid effusion. Normal orbits. IMPRESSION: 1. No acute intracranial abnormality. 2. Chronic microvascular ischemia. Electronically Signed   By: Ulyses Jarred M.D.   On: 03/31/2016 14:52   Ct Chest Wo Contrast  Result Date: 03/31/2016 CLINICAL DATA:  75 y/o  F; EXAM: CT CHEST WITHOUT CONTRAST TECHNIQUE: Multidetector CT imaging of the chest was performed following the standard protocol without IV contrast. COMPARISON:  03/31/2016 chest radiograph FINDINGS: Cardiovascular: Aortic atherosclerosis with moderate calcification. The descending thoracic aorta measures  up to 36 mm at the hilum (series 5, image 59). Normal caliber main pulmonary artery. Normal heart size. No pericardial effusion. Severe coronary artery calcification. Aortic valvular calcification. Mediastinum/Nodes: Mediastinal lymphadenopathy predominantly at the sub- carinal, right and left lower paratracheal, and AP window stations for example conglomerate precarinal lymph nodes measure approximately 21 x 31 mm (series 2 image 61). Lungs/Pleura: Left suprahilar masslike opacity measures approximately 68 x 37 x 64 mm (AP x ML x CC) series 2, image 49 series 5, image 50. The bronchi extending to the apical posterior probably anterior segments are occluded with  atelectasis of the anterior segment. Throughout the apical posterior segment. 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 There are numerous pulmonary nodules throughout the lungs bilaterally the largest on the right subjacent pleura and upper lobe measuring 12 x 18 mm (series 3 image 86. Upper Abdomen: No acute abnormality. Musculoskeletal: No chest wall mass or suspicious bone lesions identified. IMPRESSION: 1. Left suprahilar mass measuring up to 6.8 cm. There is occlusion of left upper lobe segmental bronchi and distinguishing mass from atelectasis is suboptimal without intravenous contrast. This probably represents primary lung cancer, possible metastatic disease. 2. Numerous pulmonary nodules in the lungs bilaterally are likely metastatic. 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 3. Lower mediastinal lymphadenopathy is likely metastatic. 4. Aortic valvular calcification associated with aortic stenosis. Severe coronary artery calcification. 5. 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 Recommend annual imaging followup by CTA or MRA. This recommendation follows 2010 ACCF/AHA/AATS/ACR/ASA/SCA/SCAI/SIR/STS/SVM Guidelines for the Diagnosis and Management of Patients with Thoracic Aortic Disease. Circulation.2010; 121: I016-P537 Electronically Signed   By: Kristine Garbe M.D.   On: 03/31/2016 16:28       PATHOLOGY:  N/A  ASSESSMENT/PLAN:   Left suprahilar mass, 6.8 cm with occlusion of LUL segmental bronchi, numerous pulmonary nodules bilaterally, lower mediastinal lymphadenopathy.  She is scheduled for bronchoscopy by Dr. Luan Pulling tomorrow, 04/01/2016, for tissue diagnosis.  Order placed for CT abd/pelvis with contrast for staging purposes.  I suspect a  discharge in the next 48 hours.  She will; follow-up with [place] in the cancer center following Christmas as an outpatient.  DNR  Hypercalcemia, multifactorial with an element of dehydration and possible malignancy-induced.  AKI, improved  Anemia, normocytic, normochromic, worse.  Orders placed for 2 units of PRBCs ordered.  Anemia panel is ordered as well.   Patient and plan discussed with Dr. Ancil Linsey and she is in agreement with the aforementioned.   KEFALAS,THOMAS, PA-C 04/01/2016 6:00 PM

## 2016-04-01 NOTE — Progress Notes (Signed)
PROGRESS NOTE    Michelle Mendoza  MVH:[id number] DOB: May 11, 1940 DOA: 03/31/2016 PCP: Sallee Lange, MD     Brief Narrative:  75 y/o woman admitted from home on 12/18 due to URI symptoms, weight loss and hoarseness. Found to have a large lung mass.   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 -Consulted Dr. Luan Pulling for bronchoscopy which he plans to perform in am.  ARF -Improving with IVF.   DVT prophylaxis: lovenox Code Status: DNR Family Communication: sister at bedside Disposition Plan: home after bronch  Consultants:   Pulmonary  Procedures:   None  Antimicrobials:  Anti-infectives    None       Subjective: Feels weak, happy that testing can be performed at [facility].  Objective: Vitals:   03/31/16 1825 03/31/16 2048 04/01/16 0634 04/01/16 1424  BP: (!) 160/75 120/65 (!) 142/70 (!) 131/57  Pulse: 75 80 80 79  Resp: '20 18 18 20  '$ Temp: 97.6 F (36.4 C) 97.9 F (36.6 C) 97.7 F (36.5 C) 98.6 F (37 C)  TempSrc: Oral Oral Oral Oral  SpO2: 100% 100% 99% 99%  Weight:      Height:        Intake/Output Summary (Last 24 hours) at 04/01/16 1531 Last data filed at 04/01/16 1426  Gross per 24 hour  Intake          2473.33 ml  Output             1825 ml  Net           648.33 ml   Filed Weights   03/31/16 1136  Weight: 40.8 kg (90 lb)    Examination:  General exam: Alert, awake, oriented x 3 Respiratory system: Clear to auscultation. Respiratory effort normal. Cardiovascular system:RRR. No murmurs, rubs, gallops. Gastrointestinal system: Abdomen is nondistended, soft and nontender. No organomegaly or masses felt. Normal bowel sounds heard. Central nervous system:  Alert and oriented. No focal neurological deficits. Extremities: No C/C/E, +pedal pulses Skin: No rashes, lesions or ulcers Psychiatry: Judgement and insight appear normal. Mood & affect appropriate.     Data Reviewed: I have personally reviewed following labs and imaging studies  CBC:  Recent Labs Lab 03/31/16 1201 04/01/16 0416  WBC 10.8* 10.5  HGB 8.8* 7.4*  HCT 28.7* 24.3*  MCV 85.2 84.4  PLT 451* 841   Basic Metabolic Panel:  Recent Labs Lab 03/31/16 1201 04/01/16 0416  NA 134* 134*  K 5.1 4.8  CL 98* 105  CO2 25 22  GLUCOSE 168* 93  BUN 54* 36*  CREATININE 1.76* 1.11*  CALCIUM 11.1* 9.6   GFR: Estimated Creatinine Clearance: 29.1 mL/min (by C-G formula based on SCr of 1.11 mg/dL (H)). Liver Function Tests: No results for input(s): AST, ALT, ALKPHOS, BILITOT, PROT, ALBUMIN in the last 168 hours. No results for input(s): LIPASE, AMYLASE in the last 168 hours. No results for input(s): AMMONIA in the last 168 hours. Coagulation Profile:  Recent Labs Lab 04/01/16 0416  INR 1.20   Cardiac Enzymes:  Recent Labs Lab 03/31/16 1306  TROPONINI <0.03   BNP (last 3 results) No results for input(s): PROBNP in the last 8760 hours. HbA1C:  No results for input(s): HGBA1C in the last 72 hours. CBG: No results for input(s): GLUCAP in the last 168 hours. Lipid Profile: No results for input(s): CHOL, HDL, LDLCALC, TRIG, CHOLHDL, LDLDIRECT in the last 72 hours. Thyroid Function Tests:  Recent Labs  03/31/16 1306  TSH 0.127*   Anemia Panel: No results for input(s): VITAMINB12, FOLATE, FERRITIN, TIBC, IRON, RETICCTPCT in the last 72 hours. Urine analysis:    Component Value Date/Time   COLORURINE AMBER (A) 03/31/2016 1242   APPEARANCEUR HAZY (A) 03/31/2016 1242   LABSPEC 1.017 03/31/2016 1242   PHURINE 6.0 03/31/2016 1242   GLUCOSEU NEGATIVE 03/31/2016 1242   HGBUR NEGATIVE 03/31/2016 1242   BILIRUBINUR NEGATIVE 03/31/2016 1242   KETONESUR NEGATIVE  03/31/2016 1242   PROTEINUR NEGATIVE 03/31/2016 1242   UROBILINOGEN 0.2 11/21/2008 1531   NITRITE NEGATIVE 03/31/2016 1242   LEUKOCYTESUR NEGATIVE 03/31/2016 1242   Sepsis Labs: '@LABRCNTIP'$ (procalcitonin:4,lacticidven:4)  ) Recent Results (from the past 240 hour(s))  Blood Culture (routine x 2)     Status: None (Preliminary result)   Collection Time: 03/31/16  1:19 PM  Result Value Ref Range Status   Specimen Description BLOOD RIGHT ARM  Final   Special Requests BOTTLES DRAWN AEROBIC AND ANAEROBIC 6CC  Final   Culture NO GROWTH < 24 HOURS  Final   Report Status PENDING  Incomplete  Blood Culture (routine x 2)     Status: None (Preliminary result)   Collection Time: 03/31/16  2:53 PM  Result Value Ref Range Status   Specimen Description BLOOD RIGHT FOREARM  Final   Special Requests BOTTLES DRAWN AEROBIC AND ANAEROBIC 6CC  Final   Culture NO GROWTH < 24 HOURS  Final   Report Status PENDING  Incomplete         Radiology Studies: Dg Chest 2 View  Result Date: 03/31/2016 CLINICAL DATA:  Weakness.  Back pain.  Fall . EXAM: CHEST  2 VIEW COMPARISON:  CT 11/21/2008. FINDINGS: A very large left suprahilar infiltrate or mass noted. Pulmonary nodular densities are noted throughout the lungs, with the largest measuring approximately 2 cm and in the right mid lung. Underlying malignancy should be considered in this patient. IV contrast-enhanced chest CT suggested for further evaluation. Pleural-parenchymal thickening consistent with scarring. No pleural effusion or pneumothorax. Heart size normal. Surgical clips in the neck. Carotid vascular calcification . IMPRESSION: Large left suprahilar infiltrate or mass. Bilateral pulmonary nodules particularly prominent in the right mid lung. These findings are suspicious for malignancy. IV contrast-enhanced chest CT suggest for further evaluation . Electronically Signed   By: Marcello Moores  Register   On: 03/31/2016 14:45   Dg Lumbar Spine Complete  Result  Date: 03/31/2016 CLINICAL DATA:  Weakness.  Low back pain . EXAM: LUMBAR SPINE - COMPLETE 4+ VIEW COMPARISON:  No prior . FINDINGS: Surgical clips noted over the abdomen. Thoracolumbar spine scoliosis. Diffuse severe degenerative changes lumbar spine . No evidence of fracture or dislocation. Aortoiliac atherosclerotic vascular disease . IMPRESSION: 1. Surgical clips noted over the abdomen. Scoliosis lumbar spine concave right . Diffuse multilevel degenerative change. No acute abnormality. 2. Aortoiliac atherosclerotic vascular disease . Electronically Signed   By: Marcello Moores  Register   On: 03/31/2016 14:47   Ct Head Wo Contrast  Result Date: 03/31/2016 CLINICAL DATA:  Upper respiratory infection. Fall 1 week ago. Weakness and dizziness. Headache. EXAM: CT HEAD WITHOUT CONTRAST TECHNIQUE: Contiguous axial images were obtained from the base of the skull through the vertex without intravenous contrast. COMPARISON:  None. FINDINGS:  Brain: No mass lesion, intraparenchymal hemorrhage or extra-axial collection. No evidence of acute cortical infarct. Focal hypodensity within the right the anterior temporal pole is favored to be a large perivascular space or neuroepithelial cyst. Otherwise, there is periventricular hypoattenuation compatible with chronic microvascular ischemia. Vascular: Atherosclerotic calcification of the vertebral and internal carotid arteries at the skullbase. Skull: Normal visualized skull base, calvarium and extracranial soft tissues. Sinuses/Orbits: No sinus fluid levels or advanced mucosal thickening. No mastoid effusion. Normal orbits. IMPRESSION: 1. No acute intracranial abnormality. 2. Chronic microvascular ischemia. Electronically Signed   By: Ulyses Jarred M.D.   On: 03/31/2016 14:52   Ct Chest Wo Contrast  Result Date: 03/31/2016 CLINICAL DATA:  75 y/o  F; EXAM: CT CHEST WITHOUT CONTRAST TECHNIQUE: Multidetector CT imaging of the chest was performed following the standard protocol  without IV contrast. COMPARISON:  03/31/2016 chest radiograph FINDINGS: Cardiovascular: Aortic atherosclerosis with moderate calcification. The descending thoracic aorta measures up to 36 mm at the hilum (series 5, image 59). Normal caliber main pulmonary artery. Normal heart size. No pericardial effusion. Severe coronary artery calcification. Aortic valvular calcification. Mediastinum/Nodes: Mediastinal lymphadenopathy predominantly at the sub- carinal, right and left lower paratracheal, and AP window stations for example conglomerate precarinal lymph nodes measure approximately 21 x 31 mm (series 2 image 61). Lungs/Pleura: Left suprahilar masslike opacity measures approximately 68 x 37 x 64 mm (AP x ML x CC) series 2, image 49 series 5, image 50. The bronchi extending to the apical posterior probably anterior segments are occluded with atelectasis of the anterior segment. Throughout the apical posterior segment. 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 There are numerous pulmonary nodules throughout the lungs bilaterally the largest on the right subjacent pleura and upper lobe measuring 12 x 18 mm (series 3 image 86. Upper Abdomen: No acute abnormality. Musculoskeletal: No chest wall mass or suspicious bone lesions identified. IMPRESSION: 1. Left suprahilar mass measuring up to 6.8 cm. There is occlusion of left upper lobe segmental bronchi and distinguishing mass from atelectasis is suboptimal without intravenous contrast. This probably represents primary lung cancer, possible metastatic disease. 2. Numerous pulmonary nodules in the lungs bilaterally are likely metastatic. 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 3. Lower mediastinal lymphadenopathy is likely metastatic. 4. Aortic valvular  calcification associated with aortic stenosis. Severe coronary artery calcification. 5. 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 Recommend annual imaging followup by CTA or MRA. This recommendation follows 2010 ACCF/AHA/AATS/ACR/ASA/SCA/SCAI/SIR/STS/SVM Guidelines for the Diagnosis and Management of Patients with Thoracic Aortic Disease. Circulation.2010; 121: G295-M841 Electronically Signed   By: Kristine Garbe M.D.   On: 03/31/2016 16:28        Scheduled Meds: . sodium chloride   Intravenous Once  . docusate sodium  100 mg Oral BID  . enoxaparin (LOVENOX) injection  30 mg Subcutaneous Q24H  . feeding supplement  1 Container Oral Q24H  . lactose free nutrition  237 mL Oral BID BM  . multivitamin-lutein  1 capsule Oral Daily  . nicotine  14 mg Transdermal Daily  . pravastatin  20 mg Oral q1800   Continuous Infusions: . lactated ringers 125 mL/hr at 04/01/16 1140     LOS: 1 day    Time spent: 25 minutes. Greater than 50% of this time was spent in direct contact with the patient coordinating care.     HERNANDEZ  Reather Converse, MD [facility] Pager [phone]  If 7PM-7AM, please contact night-coverage [email or web address] Password [email or web address] 04/01/2016, 3:31 PM

## 2016-04-01 NOTE — Progress Notes (Signed)
Patient ID: Michelle Mendoza, female   DOB: 03-Nov-1940, 75 y.o.   MRN: [id number]   Request received for lung mass biopsy  Imaging reviewed with Dr Annamaria Boots Recommend Bronchoscopy/Pulm consult.  MD aware

## 2016-04-01 NOTE — Progress Notes (Signed)
Initial Nutrition Assessment  DOCUMENTATION CODES:  Underweight, Severe malnutrition in context of chronic illness   Pt meets criteria for SEVERE MALNUTRITION in the context of Chronic Illness as evidenced by loss of >15% in 3 months and severe muscle loss  INTERVENTION:  Chocolate Boost BID  Magic cup q24 each supplement provides 290 kcal and 9 grams of protein  Mighty Shake II Q 24 hrs q 24 hrs , each supplement provides 480-500 kcals and 20-23 grams of protein  Boost Breeze q 24, each supplement provides 250 kcal and 9 grams of protein   NUTRITION DIAGNOSIS:  Inadequate oral intake related to poor appetite, Weakness and fatigue as evidenced by loss of >15% bw in last 3 months alone  GOAL:  Patient will meet greater than or equal to 90% of their needs  MONITOR:  PO intake, Supplement acceptance, Labs, I & O's  REASON FOR ASSESSMENT:  Consult Assessment of nutrition requirement/status  ASSESSMENT:  75 y/o female PMhx HTN, HLD, osteoporosis. Presented to ED with weakness, fatigue, dizziness, back pain x 10 days with associated reduced appetite and wt loss. Imaging revealed likely advanced lung cancer. Also with acute renal failure, likely secondary to poor PO intake, and anemia.    Pt reports that she has not had any appetite for what sounds to be the last month and a half. She could not quantify exactly how much she has been eating. A large part of her time has been devoted to taking care of her husband and she says she would just take bites in between helping him. She has been consuming ~ 2 Chocolate Boost supplements/day at home. She doesn't know when she started with these. She took multiple vitamin/minerals too: B12, coq10, Vit C, Calcium, Vit D.   She denies an n/v/c/d. Last BM was 2 days ago. This is her normal pattern.    UBW was 115-120 lbs. She has lost 17 lbs (16%) in the last 2 months alone. It looks like her wt loss started farther back. She has lost 30 lbs (25%) bw  in the last 15 months. Her clothes no longer fit. She went from size 8 to a 4.   She still has no desire for food. She has been asking for, and receiving, just soup for her meals. RD spent a large amount of time trying to find items the patient would except. She was agreeable to some supplement's, but as far as food, she has no desire for anything besides soup.   RD will touch base with dietary and ask them to touch base with her frequently.   NFPE: Severe muscle loss, moderate fat loss  Labs reviewed: H/H:7.4/24.3. BG 93 Medications: Colace, Lactated Ringers IV   Recent Labs Lab 03/31/16 1201 04/01/16 0416  NA 134* 134*  K 5.1 4.8  CL 98* 105  CO2 25 22  BUN 54* 36*  CREATININE 1.76* 1.11*  CALCIUM 11.1* 9.6  GLUCOSE 168* 93   Diet Order:  Diet regular Room service appropriate? Yes; Fluid consistency: Thin  Skin:Dry  Last BM:  12/17  Height:  Ht Readings from Last 1 Encounters:  03/31/16 5' 2.5" (1.588 m)   Weight:  Wt Readings from Last 1 Encounters:  03/31/16 90 lb (40.8 kg)   Wt Readings from Last 10 Encounters:  03/31/16 90 lb (40.8 kg)  01/01/16 107 lb (48.5 kg)  07/03/15 113 lb 3.2 oz (51.3 kg)  01/02/15 120 lb 4 oz (54.5 kg)  07/05/14 121 lb (54.9 kg)  06/22/14 120 lb 6.4 oz (54.6 kg)  06/21/13 121 lb (54.9 kg)  01/12/13 124 lb 6.4 oz (56.4 kg)   Ideal Body Weight:  51.14 kg  BMI:  Body mass index is 16.2 kg/m.  Estimated Nutritional Needs:  Kcal:  1450-1650 kcals (35-40 kcal/kg bw) Protein:  70-80 g (1.7-2 g/kg bw) Fluid:  >1.2 L (30 ml/kg bw)  EDUCATION NEEDS:  No education needs identified at this time  Burtis Junes RD, LDN, [place] [facility] Pager: [phone] 04/01/2016 12:40 PM

## 2016-04-01 NOTE — Consult Note (Signed)
Consult requested by: Dr. Jerilee Hoh Consult requested for abnormal chest CT:  HPI: This is a 75 year old who came to the hospital because of upper respiratory infection and hoarseness. She's been losing weight. She has a chronic cough. There is a positive family history of lung cancer. Her CT of the chest which I have personally reviewed shows what appears to be a left suprahilar mass that's about 6.8 cm. It does appear to occlude at least part of the left upper lobe bronchi. There are multiple other pulmonary lesions suspected to be from metastatic disease. 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 She has been coughing. No hemoptysis. No fever or chills. She's been very weak. No nausea or vomiting. She has had a headache but she fell.  Past Medical History:  Diagnosis Date  . Cancer ([place])    bladder  . Elevated transaminase level   . H/O renal artery stenosis   . Hyperlipidemia   . Hypertension   . Osteoporosis      Family History  Problem Relation Age of Onset  . Hypertension Sister   . Hypertension Other   . Stroke Other   . Heart attack Other   . Dementia Mother 56    significant ASCVD  . Lung cancer Father 66  . Brain cancer Brother 30     Social History   Social History  . Marital status: Married    Spouse name: N/A  . Number of children: N/A  . Years of education: N/A   Occupational History  . 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 Smoking status: Current Every Day Smoker    Packs/day: 1.00    Types: Cigarettes  . Smokeless tobacco: Never Used     Comment: currently only 4 cigs/day  . Alcohol use No  . Drug use: No  . Sexual activity: Not Asked   Other Topics Concern  . None   Social History Narrative  . None     ROS: Except as mentioned above 10 point review of systems is negative    Objective: Vital signs in last 24 hours: Temp:  [97.6 F (36.4 C)-97.9 F (36.6 C)] 97.7 F (36.5 C) (12/19 0634) Pulse Rate:   [72-80] 80 (12/19 0634) Resp:  [17-25] 18 (12/19 0634) BP: (119-160)/(51-78) 142/70 (12/19 0634) SpO2:  [97 %-100 %] 99 % (12/19 0634) Weight change:  Last BM Date: 03/30/16  Intake/Output from previous day: 12/18 0701 - 12/19 0700 In: 2108.3 [I.V.:608.3; IV Piggyback:1500] Out: 625 [Urine:625]  PHYSICAL EXAM Constitutional: She is awake and alert. She is very thin. Eyes: Pupils react. Ears nose mouth and throat: Mucous membranes are moist. Cardiovascular: Her heart is regular with normal heart sounds and no edema. Respiratory: Normal respiratory effort. She has rhonchi more on the left than on the right and somewhat diminished breath sounds on the left. Gastrointestinal I don't feel her liver skin: Warm and dry neurological: Grossly intact with no focal abnormalities  Lab Results: Basic Metabolic Panel:  Recent Labs  03/31/16 1201 04/01/16 0416  NA 134* 134*  K 5.1 4.8  CL 98* 105  CO2 25 22  GLUCOSE 168* 93  BUN 54* 36*  CREATININE 1.76* 1.11*  CALCIUM 11.1* 9.6   Liver Function Tests: No results for input(s): AST, ALT, ALKPHOS, BILITOT, PROT, ALBUMIN in the last 72 hours. No results for input(s): LIPASE, AMYLASE in the last 72 hours. No results for input(s): AMMONIA in the last 72 hours. CBC:  Recent Labs  03/31/16 1201 04/01/16 0416  WBC 10.8* 10.5  HGB 8.8* 7.4*  HCT 28.7* 24.3*  MCV 85.2 84.4  PLT 451* 398   Cardiac Enzymes:  Recent Labs  03/31/16 1306  TROPONINI <0.03   BNP: No results for input(s): PROBNP in the last 72 hours. D-Dimer: No results for input(s): DDIMER in the last 72 hours. CBG: No results for input(s): GLUCAP in the last 72 hours. Hemoglobin A1C: No results for input(s): HGBA1C in the last 72 hours. Fasting Lipid Panel: No results for input(s): CHOL, HDL, LDLCALC, TRIG, CHOLHDL, LDLDIRECT in the last 72 hours. Thyroid Function Tests:  Recent Labs  03/31/16 1306  TSH 0.127*   Anemia Panel: No results for input(s):  VITAMINB12, FOLATE, FERRITIN, TIBC, IRON, RETICCTPCT in the last 72 hours. Coagulation:  Recent Labs  04/01/16 0416  LABPROT 15.3*  INR 1.20   Urine Drug Screen: Drugs of Abuse  No results found for: LABOPIA, COCAINSCRNUR, LABBENZ, AMPHETMU, THCU, LABBARB  Alcohol Level: No results for input(s): ETH in the last 72 hours. Urinalysis:  Recent Labs  03/31/16 1242  [place] 1.017  PHURINE 6.0  GLUCOSEU NEGATIVE  HGBUR NEGATIVE  BILIRUBINUR NEGATIVE  KETONESUR NEGATIVE  PROTEINUR NEGATIVE  NITRITE NEGATIVE  LEUKOCYTESUR NEGATIVE   Misc. Labs:   ABGS: No results for input(s): PHART, PO2ART, TCO2, HCO3 in the last 72 hours.  Invalid input(s): PCO2   MICROBIOLOGY: Recent Results (from the past 240 hour(s))  Blood Culture (routine x 2)     Status: None (Preliminary result)   Collection Time: 03/31/16  1:19 PM  Result Value Ref Range Status   Specimen Description BLOOD RIGHT ARM  Final   Special Requests BOTTLES DRAWN AEROBIC AND ANAEROBIC 6CC  Final   Culture NO GROWTH < 24 HOURS  Final   Report Status PENDING  Incomplete  Blood Culture (routine x 2)     Status: None (Preliminary result)   Collection Time: 03/31/16  2:53 PM  Result Value Ref Range Status   Specimen Description BLOOD RIGHT FOREARM  Final   Special Requests BOTTLES DRAWN AEROBIC AND ANAEROBIC 6CC  Final   Culture NO GROWTH < 24 HOURS  Final   Report Status PENDING  Incomplete    Studies/Results: Dg Chest 2 View  Result Date: 03/31/2016 CLINICAL DATA:  Weakness.  Back pain.  Fall . EXAM: CHEST  2 VIEW COMPARISON:  CT 11/21/2008. FINDINGS: A very large left suprahilar infiltrate or mass noted. Pulmonary nodular densities are noted throughout the lungs, with the largest measuring approximately 2 cm and in the right mid lung. Underlying malignancy should be considered in this patient. IV contrast-enhanced chest CT suggested for further evaluation. Pleural-parenchymal thickening  consistent with scarring. No pleural effusion or pneumothorax. Heart size normal. Surgical clips in the neck. Carotid vascular calcification . IMPRESSION: Large left suprahilar infiltrate or mass. Bilateral pulmonary nodules particularly prominent in the right mid lung. These findings are suspicious for malignancy. IV contrast-enhanced chest CT suggest for further evaluation . Electronically Signed   By: Marcello Moores  Register   On: 03/31/2016 14:45   Dg Lumbar Spine Complete  Result Date: 03/31/2016 CLINICAL DATA:  Weakness.  Low back pain . EXAM: LUMBAR SPINE - COMPLETE 4+ VIEW COMPARISON:  No prior . FINDINGS: Surgical clips noted over the abdomen. Thoracolumbar spine scoliosis. Diffuse severe degenerative changes lumbar spine . No evidence of fracture or dislocation. Aortoiliac atherosclerotic vascular disease . IMPRESSION: 1. Surgical clips noted over the abdomen. Scoliosis lumbar  spine concave right . Diffuse multilevel degenerative change. No acute abnormality. 2. Aortoiliac atherosclerotic vascular disease . Electronically Signed   By: Marcello Moores  Register   On: 03/31/2016 14:47   Ct Head Wo Contrast  Result Date: 03/31/2016 CLINICAL DATA:  Upper respiratory infection. Fall 1 week ago. Weakness and dizziness. Headache. EXAM: CT HEAD WITHOUT CONTRAST TECHNIQUE: Contiguous axial images were obtained from the base of the skull through the vertex without intravenous contrast. COMPARISON:  None. FINDINGS: Brain: No mass lesion, intraparenchymal hemorrhage or extra-axial collection. No evidence of acute cortical infarct. Focal hypodensity within the right the anterior temporal pole is favored to be a large perivascular space or neuroepithelial cyst. Otherwise, there is periventricular hypoattenuation compatible with chronic microvascular ischemia. Vascular: Atherosclerotic calcification of the vertebral and internal carotid arteries at the skullbase. Skull: Normal visualized skull base, calvarium and extracranial  soft tissues. Sinuses/Orbits: No sinus fluid levels or advanced mucosal thickening. No mastoid effusion. Normal orbits. IMPRESSION: 1. No acute intracranial abnormality. 2. Chronic microvascular ischemia. Electronically Signed   By: Ulyses Jarred M.D.   On: 03/31/2016 14:52   Ct Chest Wo Contrast  Result Date: 03/31/2016 CLINICAL DATA:  75 y/o  F; EXAM: CT CHEST WITHOUT CONTRAST TECHNIQUE: Multidetector CT imaging of the chest was performed following the standard protocol without IV contrast. COMPARISON:  03/31/2016 chest radiograph FINDINGS: Cardiovascular: Aortic atherosclerosis with moderate calcification. The descending thoracic aorta measures up to 36 mm at the hilum (series 5, image 59). Normal caliber main pulmonary artery. Normal heart size. No pericardial effusion. Severe coronary artery calcification. Aortic valvular calcification. Mediastinum/Nodes: Mediastinal lymphadenopathy predominantly at the sub- carinal, right and left lower paratracheal, and AP window stations for example conglomerate precarinal lymph nodes measure approximately 21 x 31 mm (series 2 image 61). Lungs/Pleura: Left suprahilar masslike opacity measures approximately 68 x 37 x 64 mm (AP x ML x CC) series 2, image 49 series 5, image 50. The bronchi extending to the apical posterior probably anterior segments are occluded with atelectasis of the anterior segment. Throughout the apical posterior segment. 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 There are numerous pulmonary nodules throughout the lungs bilaterally the largest on the right subjacent pleura and upper lobe measuring 12 x 18 mm (series 3 image 86. Upper Abdomen: No acute abnormality. Musculoskeletal: No chest wall mass or suspicious bone lesions identified. IMPRESSION: 1. Left suprahilar mass measuring up to 6.8 cm. There is occlusion of left upper lobe segmental bronchi and  distinguishing mass from atelectasis is suboptimal without intravenous contrast. This probably represents primary lung cancer, possible metastatic disease. 2. Numerous pulmonary nodules in the lungs bilaterally are likely metastatic. 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 3. Lower mediastinal lymphadenopathy is likely metastatic. 4. Aortic valvular calcification associated with aortic stenosis. Severe coronary artery calcification. 5. 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 Recommend annual imaging followup by CTA or MRA. This recommendation follows 2010 ACCF/AHA/AATS/ACR/ASA/SCA/SCAI/SIR/STS/SVM Guidelines for the Diagnosis and Management of Patients with Thoracic Aortic Disease. Circulation.2010; 121: W299-B716 Electronically Signed   By: Kristine Garbe M.D.   On: 03/31/2016 16:28    Medications:  Prior to Admission:  Prescriptions Prior to Admission  Medication Sig Dispense Refill Last Dose  . acetaminophen (TYLENOL) 500 MG tablet Take 1,000 mg by mouth every 6 (six) hours as needed for moderate pain.   03/31/2016 at  Unknown time  . alendronate (FOSAMAX) 70 MG tablet Take 1 tablet (70 mg total) by mouth every 7 (seven) days. Take with a full glass of water on an empty stomach sitting upright. 12 tablet 3 2 weeks  . aspirin 81 MG tablet Take 81 mg by mouth daily.   03/31/2016 at Unknown time  . atenolol (TENORMIN) 50 MG tablet TAKE (1) TABLET BY MOUTH TWICE DAILY. 180 tablet 3 03/31/2016 at 0830  . calcium-vitamin D (OSCAL WITH D) 500-200 MG-UNIT per tablet Take 1 tablet by mouth 2 (two) times daily.   03/31/2016 at Unknown time  . co-enzyme Q-10 30 MG capsule Take 100 mg by mouth daily.   03/31/2016 at Unknown time  . Cyanocobalamin (B-12) 250 MCG TABS Take 1 tablet by mouth daily.    03/31/2016 at Unknown time  .  lisinopril-hydrochlorothiazide (PRINZIDE,ZESTORETIC) 20-12.5 MG tablet Take 1 tablet by mouth every morning. 90 tablet 1 03/31/2016 at Unknown time  . multivitamin-lutein (OCUVITE-LUTEIN) CAPS capsule Take 1 capsule by mouth daily.   03/31/2016 at Unknown time  . Omega-3 Fatty Acids (FISH OIL) 1000 MG CAPS Take 1 capsule by mouth daily.   03/31/2016 at Unknown time  . pravastatin (PRAVACHOL) 20 MG tablet Take 1 tablet (20 mg total) by mouth daily. 90 tablet 1 03/30/2016 at Unknown time  . zolpidem (AMBIEN) 5 MG tablet Take 1 tablet (5 mg total) by mouth at bedtime as needed for sleep. 90 tablet 1 03/30/2016 at Unknown time   Scheduled: . sodium chloride   Intravenous Once  . docusate sodium  100 mg Oral BID  . enoxaparin (LOVENOX) injection  30 mg Subcutaneous Q24H  . feeding supplement  1 Container Oral Q24H  . lactose free nutrition  237 mL Oral BID BM  . multivitamin-lutein  1 capsule Oral Daily  . nicotine  14 mg Transdermal Daily  . pravastatin  20 mg Oral q1800   Continuous: . lactated ringers 125 mL/hr at 04/01/16 1140   GPQ:DIYMEBRAXENMM **OR** acetaminophen, morphine injection, ondansetron **OR** ondansetron (ZOFRAN) IV, zolpidem  Assesment: She has what appears to be metastatic lung cancer. She does need tissue diagnosis. I will plan for bronchoscopy tomorrow. Discussed at bedside with the patient her daughter and sister. 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

## 2016-04-02 ENCOUNTER — Encounter (HOSPITAL_COMMUNITY)
Admission: EM | Disposition: A | Discharge status: Home / Self Care | Payer: Self-pay | Source: Home / Self Care | Attending: Internal Medicine

## 2016-04-02 ENCOUNTER — Encounter (HOSPITAL_COMMUNITY): Payer: Self-pay | Admitting: *Deleted

## 2016-04-02 DIAGNOSIS — E43 Unspecified severe protein-calorie malnutrition: Secondary | ICD-10-CM

## 2016-04-02 DIAGNOSIS — I700 Atherosclerosis of aorta: Secondary | ICD-10-CM

## 2016-04-02 DIAGNOSIS — N179 Acute kidney failure, unspecified: Secondary | ICD-10-CM

## 2016-04-02 DIAGNOSIS — R636 Underweight: Secondary | ICD-10-CM

## 2016-04-02 DIAGNOSIS — D638 Anemia in other chronic diseases classified elsewhere: Secondary | ICD-10-CM

## 2016-04-02 HISTORY — PX: FLEXIBLE BRONCHOSCOPY: SHX5094

## 2016-04-02 LAB — IRON AND TIBC
IRON: 13 ug/dL — AB (ref 28–170)
SATURATION RATIOS: 5 % — AB (ref 10.4–31.8)
TIBC: 284 ug/dL (ref 250–450)
UIBC: 271 ug/dL

## 2016-04-02 LAB — URINE CULTURE: Culture: NO GROWTH

## 2016-04-02 LAB — SURGICAL PCR SCREEN
MRSA, PCR: NEGATIVE
Staphylococcus aureus: NEGATIVE

## 2016-04-02 LAB — FOLATE: FOLATE: 30.1 ng/mL (ref >5.9)

## 2016-04-02 LAB — VITAMIN B12: Vitamin B-12: 1347 pg/mL — ABNORMAL HIGH (ref 180–914)

## 2016-04-02 LAB — FERRITIN: FERRITIN: 156 ng/mL (ref 11–307)

## 2016-04-02 SURGERY — BRONCHOSCOPY, FLEXIBLE
Anesthesia: Moderate Sedation | Laterality: Bilateral

## 2016-04-02 MED ORDER — MIDAZOLAM HCL 10 MG/2ML IJ SOLN
INTRAMUSCULAR | Status: DC | PRN
  Administered 2016-04-02 (×2): 2.5 mg via INTRAVENOUS

## 2016-04-02 MED ORDER — FENTANYL CITRATE (PF) 100 MCG/2ML IJ SOLN
INTRAMUSCULAR | Status: AC
  Filled 2016-04-02: qty 2

## 2016-04-02 MED ORDER — MIDAZOLAM HCL 5 MG/5ML IJ SOLN
INTRAMUSCULAR | Status: AC
  Filled 2016-04-02: qty 5

## 2016-04-02 MED ORDER — LIDOCAINE VISCOUS 2 % MT SOLN
OROMUCOSAL | Status: AC
  Filled 2016-04-02: qty 15

## 2016-04-02 MED ORDER — LIDOCAINE VISCOUS 2 % MT SOLN
OROMUCOSAL | Status: DC | PRN
  Administered 2016-04-02: 1 via OROMUCOSAL

## 2016-04-02 MED ORDER — BUTAMBEN-TETRACAINE-BENZOCAINE 2-2-14 % EX AERO
INHALATION_SPRAY | CUTANEOUS | Status: DC | PRN
  Administered 2016-04-02: 2 via TOPICAL

## 2016-04-02 MED ORDER — MEPERIDINE HCL 100 MG/ML IJ SOLN
INTRAMUSCULAR | Status: AC
  Filled 2016-04-02: qty 1

## 2016-04-02 MED ORDER — LIDOCAINE HCL (PF) 2 % IJ SOLN
INTRAMUSCULAR | Status: AC
  Filled 2016-04-02: qty 20

## 2016-04-02 SURGICAL SUPPLY — 16 items
BRUSH CYTOL CELLEBRITY 1.5X140 (MISCELLANEOUS) ×3 IMPLANT
CLOTH BEACON ORANGE TIMEOUT ST (SAFETY) ×3 IMPLANT
CONNECTOR 5 IN 1 STRAIGHT STRL (MISCELLANEOUS) ×3 IMPLANT
FORCEPS BIOP RJ4 1.8 (CUTTING FORCEPS) ×3 IMPLANT
GLOVE BIO SURGEON STRL SZ7.5 (GLOVE) ×3 IMPLANT
KIT CLEAN CATCH URINE (SET/KITS/TRAYS/PACK) IMPLANT
MARKER SKIN DUAL TIP RULER LAB (MISCELLANEOUS) ×3 IMPLANT
NS IRRIG 1000ML POUR BTL (IV SOLUTION) ×3 IMPLANT
SPONGE GAUZE 4X4 12PLY (GAUZE/BANDAGES/DRESSINGS) ×3 IMPLANT
SYR 20CC LL (SYRINGE) ×3 IMPLANT
SYR 30ML LL (SYRINGE) ×3 IMPLANT
SYR CONTROL 10ML LL (SYRINGE) ×3 IMPLANT
TRAP SPECIMEN CP (MISCELLANEOUS) ×3 IMPLANT
VALVE DISPOSABLE (MISCELLANEOUS) ×3 IMPLANT
WATER STERILE IRR 1000ML POUR (IV SOLUTION) ×3 IMPLANT
YANKAUER SUCT BULB TIP 10FT TU (MISCELLANEOUS) ×6 IMPLANT

## 2016-04-02 NOTE — H&P (Signed)
Patient interviewed and examined. No change since my consult of 04/01/2016

## 2016-04-02 NOTE — Discharge Summary (Signed)
Physician Discharge Summary  Michelle Mendoza:[id number] DOB: 03-12-1941 DOA: 03/31/2016  PCP: Sallee Lange, MD  Admit date: 03/31/2016 Discharge date: 04/02/2016  Recommendations for Outpatient Follow-up:  1. Lung biopsy and brushings via bronchoscopy, pathology pending 2. Suspected lung cancer 3. Low TSH of unclear significance. Consider repeat testing as an outpatient.  Follow-up Information    Molli Hazard, MD Follow up.   Specialties:  Hematology and Oncology, Oncology Why:  office will call you with appointment Contact information: [place] [address] [phone]          Discharge Diagnoses:  1. Left suprahilar lung mass, presumed lung cancer 2. Acute kidney injury 3. Failure to thrive 4. Normocytic, normochromic anemia Hypercalcemia secondary to dehydration 5. Underweight, severe malnutrition 6. Low TSH 7. Severe aortic and branch vessel atherosclerosis  Discharge Condition: improved Disposition: home  Diet recommendation: regular  Filed Weights   03/31/16 1136  Weight: 40.8 kg (90 lb)    History of present illness:  75 year old woman presented with history of hoarseness, weight loss, increasing weakness. Workup with CT suggested lung cancer. She was admitted for further evaluation.  Hospital Course:  Patient was seen by Dr. Luan Pulling and underwent bronchoscopy 12/20 with biopsy and brushings taken. Also seen by oncology with recommendation for tissue biopsy an outpatient follow-up which they will arrange. Transfused 2 units packed red blood cells per oncology for anemia. Acute kidney injury resolved with IV fluids. On the day of discharge patient feeling well and requesting to go home. Individual issues as below  1. Left suprahilar lung mass presumed lung cancer. Status post bronchoscopy 12/20 on a vent well. Washings and biopsy pending. 2. Hypercalcemia secondary to dehydration. Resolved with fluids. 3. Failure to thrive  secondary to lung cancer. 4. Acute kidney injury. Resolved. Secondary to failure to thrive. 5. Normocytic, normochromic anemia. Status post 2 units packed red blood cells. Secondary to malignancy. 6. Underweight, severe malnutrition 7. Low TSH. Follow-up as an outpatient. 8. Severe aortic and branch vessel atherosclerosis. Asymptomatic.   Consultants:  Pulmonology  Oncology  Procedures:  Bronchoscopy with biopsy and brushings  Transfusion 2 units packed red blood cells  Discharge Instructions  Discharge Instructions    Diet - low sodium heart healthy    Complete by:  As directed    Discharge instructions    Complete by:  As directed    Call your physician or seek immediate medical attention for shortness of breath, fever, bleeding, pain or worsening of condition.   Increase activity slowly    Complete by:  As directed      Allergies as of 04/02/2016      Reactions   Penicillins Other (See Comments)   Childhood allergy. Has patient had a PCN reaction causing immediate rash, facial/tongue/throat swelling, SOB or lightheadedness with hypotension: unknown Has patient had a PCN reaction causing severe rash involving mucus membranes or skin necrosis: unknown Has patient had a PCN reaction that required hospitalization: unknown Has patient had a PCN reaction occurring within the last 10 years: unknown If all of the above answers are "NO", then may proceed with Cephalosporin use.   Sulfa Antibiotics Other (See Comments)   unknown      Medication List    TAKE these medications   acetaminophen 500 MG tablet Commonly known as:  TYLENOL Take 1,000 mg by mouth every 6 (six) hours as needed for moderate pain.   alendronate 70 MG tablet Commonly known as:  FOSAMAX Take 1 tablet (70  mg total) by mouth every 7 (seven) days. Take with a full glass of water on an empty stomach sitting upright.   aspirin 81 MG tablet Take 81 mg by mouth daily.   atenolol 50 MG tablet Commonly  known as:  TENORMIN TAKE (1) TABLET BY MOUTH TWICE DAILY.   B-12 250 MCG Tabs Take 1 tablet by mouth daily.   calcium-vitamin D 500-200 MG-UNIT tablet Commonly known as:  OSCAL WITH D Take 1 tablet by mouth 2 (two) times daily.   co-enzyme Q-10 30 MG capsule Take 100 mg by mouth daily.   Fish Oil 1000 MG Caps Take 1 capsule by mouth daily.   lisinopril-hydrochlorothiazide 20-12.5 MG tablet Commonly known as:  PRINZIDE,ZESTORETIC Take 1 tablet by mouth every morning.   multivitamin-lutein Caps capsule Take 1 capsule by mouth daily.   pravastatin 20 MG tablet Commonly known as:  PRAVACHOL Take 1 tablet (20 mg total) by mouth daily.   zolpidem 5 MG tablet Commonly known as:  AMBIEN Take 1 tablet (5 mg total) by mouth at bedtime as needed for sleep.      Allergies  Allergen Reactions  . Penicillins Other (See Comments)    Childhood allergy. Has patient had a PCN reaction causing immediate rash, facial/tongue/throat swelling, SOB or lightheadedness with hypotension: unknown Has patient had a PCN reaction causing severe rash involving mucus membranes or skin necrosis: unknown Has patient had a PCN reaction that required hospitalization: unknown Has patient had a PCN reaction occurring within the last 10 years: unknown If all of the above answers are "NO", then may proceed with Cephalosporin use.   . Sulfa Antibiotics Other (See Comments)    unknown    The results of significant diagnostics from this hospitalization (including imaging, microbiology, ancillary and laboratory) are listed below for reference.    Significant Diagnostic Studies: Dg Chest 2 View  Result Date: 03/31/2016 CLINICAL DATA:  Weakness.  Back pain.  Fall . EXAM: CHEST  2 VIEW COMPARISON:  CT 11/21/2008. FINDINGS: A very large left suprahilar infiltrate or mass noted. Pulmonary nodular densities are noted throughout the lungs, with the largest measuring approximately 2 cm and in the right mid lung.  Underlying malignancy should be considered in this patient. IV contrast-enhanced chest CT suggested for further evaluation. Pleural-parenchymal thickening consistent with scarring. No pleural effusion or pneumothorax. Heart size normal. Surgical clips in the neck. Carotid vascular calcification . IMPRESSION: Large left suprahilar infiltrate or mass. Bilateral pulmonary nodules particularly prominent in the right mid lung. These findings are suspicious for malignancy. IV contrast-enhanced chest CT suggest for further evaluation . Electronically Signed   By: Marcello Moores  Register   On: 03/31/2016 14:45   Dg Lumbar Spine Complete  Result Date: 03/31/2016 CLINICAL DATA:  Weakness.  Low back pain . EXAM: LUMBAR SPINE - COMPLETE 4+ VIEW COMPARISON:  No prior . FINDINGS: Surgical clips noted over the abdomen. Thoracolumbar spine scoliosis. Diffuse severe degenerative changes lumbar spine . No evidence of fracture or dislocation. Aortoiliac atherosclerotic vascular disease . IMPRESSION: 1. Surgical clips noted over the abdomen. Scoliosis lumbar spine concave right . Diffuse multilevel degenerative change. No acute abnormality. 2. Aortoiliac atherosclerotic vascular disease . Electronically Signed   By: Marcello Moores  Register   On: 03/31/2016 14:47   Ct Head Wo Contrast  Result Date: 03/31/2016 CLINICAL DATA:  Upper respiratory infection. Fall 1 week ago. Weakness and dizziness. Headache. EXAM: CT HEAD WITHOUT CONTRAST TECHNIQUE: Contiguous axial images were obtained from the base of the skull through  the vertex without intravenous contrast. COMPARISON:  None. FINDINGS: Brain: No mass lesion, intraparenchymal hemorrhage or extra-axial collection. No evidence of acute cortical infarct. Focal hypodensity within the right the anterior temporal pole is favored to be a large perivascular space or neuroepithelial cyst. Otherwise, there is periventricular hypoattenuation compatible with chronic microvascular ischemia. Vascular:  Atherosclerotic calcification of the vertebral and internal carotid arteries at the skullbase. Skull: Normal visualized skull base, calvarium and extracranial soft tissues. Sinuses/Orbits: No sinus fluid levels or advanced mucosal thickening. No mastoid effusion. Normal orbits. IMPRESSION: 1. No acute intracranial abnormality. 2. Chronic microvascular ischemia. Electronically Signed   By: Ulyses Jarred M.D.   On: 03/31/2016 14:52   Ct Chest Wo Contrast  Result Date: 03/31/2016 CLINICAL DATA:  75 y/o  F; EXAM: CT CHEST WITHOUT CONTRAST TECHNIQUE: Multidetector CT imaging of the chest was performed following the standard protocol without IV contrast. COMPARISON:  03/31/2016 chest radiograph FINDINGS: Cardiovascular: Aortic atherosclerosis with moderate calcification. The descending thoracic aorta measures up to 36 mm at the hilum (series 5, image 59). Normal caliber main pulmonary artery. Normal heart size. No pericardial effusion. Severe coronary artery calcification. Aortic valvular calcification. Mediastinum/Nodes: Mediastinal lymphadenopathy predominantly at the sub- carinal, right and left lower paratracheal, and AP window stations for example conglomerate precarinal lymph nodes measure approximately 21 x 31 mm (series 2 image 61). Lungs/Pleura: Left suprahilar masslike opacity measures approximately 68 x 37 x 64 mm (AP x ML x CC) series 2, image 49 series 5, image 50. The bronchi extending to the apical posterior probably anterior segments are occluded with atelectasis of the anterior segment. Throughout the apical posterior segment. 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 There are numerous pulmonary nodules throughout the lungs bilaterally the largest on the right subjacent pleura and upper lobe measuring 12 x 18 mm (series 3 image 86. Upper Abdomen: No acute abnormality. Musculoskeletal: No chest wall mass  or suspicious bone lesions identified. IMPRESSION: 1. Left suprahilar mass measuring up to 6.8 cm. There is occlusion of left upper lobe segmental bronchi and distinguishing mass from atelectasis is suboptimal without intravenous contrast. This probably represents primary lung cancer, possible metastatic disease. 2. Numerous pulmonary nodules in the lungs bilaterally are likely metastatic. 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 3. Lower mediastinal lymphadenopathy is likely metastatic. 4. Aortic valvular calcification associated with aortic stenosis. Severe coronary artery calcification. 5. 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 Recommend annual imaging followup by CTA or MRA. This recommendation follows 2010 ACCF/AHA/AATS/ACR/ASA/SCA/SCAI/SIR/STS/SVM Guidelines for the Diagnosis and Management of Patients with Thoracic Aortic Disease. Circulation.2010; 121: E332-R518 Electronically Signed   By: Kristine Garbe M.D.   On: 03/31/2016 16:28   Ct Abdomen Pelvis W Contrast  Result Date: 04/02/2016 CLINICAL DATA:  Left upper lobe mass, presumed lung cancer on recent chest CT. Bilateral pulmonary nodules. No abdominal complaints. EXAM: CT ABDOMEN AND PELVIS WITH CONTRAST TECHNIQUE: Multidetector CT imaging of the abdomen and pelvis was performed using the standard protocol following bolus administration of intravenous contrast. CONTRAST:  44m ISOVUE-300 IOPAMIDOL (ISOVUE-300) INJECTION 61% COMPARISON:  Chest CT 03/31/2016.  Abdominal CT 11/21/2008. FINDINGS: Lower chest: Small nodules again noted at both lung bases, likely metastases. No significant pleural or pericardial effusion. There is aortic atherosclerosis. Hepatobiliary: The liver is normal in density without focal abnormality. No evidence of gallstones, gallbladder wall thickening or  biliary  dilatation. Pancreas: Unremarkable. No pancreatic ductal dilatation or surrounding inflammatory changes. Spleen: Normal in size without focal abnormality. Adrenals/Urinary Tract: The adrenal glands appear stable without suspicious findings. There are fairly extensive renovascular calcifications bilaterally. No definite urinary tract calculus, hydronephrosis or delay in contrast excretion. There are small bilateral renal cysts. 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 The bladder appears unremarkable. Stomach/Bowel: No evidence of bowel wall thickening, distention or surrounding inflammatory change. The appendix appears normal. Vascular/Lymphatic: There are no enlarged abdominal or pelvic lymph nodes. Extensive aortic and branch vessel atherosclerosis status post aorto bi-iliac grafting. There are probable renal artery grafts bilaterally. No evidence of acute vascular occlusion. Reproductive: The uterus and ovaries appear normal. Other: There are several small midline abdominal hernias containing only fat. No herniated bowel. No ascites. Musculoskeletal: No acute or significant osseous findings. Convex left scoliosis with associated multilevel spondylosis. IMPRESSION: 1. No evidence of metastatic disease within the abdomen or pelvis. 2. 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 3. Bilateral renal cysts. Electronically Signed   By: Richardean Sale M.D.   On: 04/02/2016 09:55    Microbiology: Recent Results (from the past 240 hour(s))  Urine culture     Status: None   Collection Time: 03/31/16 12:42 PM  Result Value Ref Range Status   Specimen Description URINE, CLEAN CATCH  Final   Special Requests NONE  Final   Culture NO GROWTH Performed at [facility]   Final   Report Status 04/02/2016 FINAL  Final  Blood Culture (routine x 2)     Status: None (Preliminary result)   Collection Time: 03/31/16  1:19  PM  Result Value Ref Range Status   Specimen Description BLOOD RIGHT ARM  Final   Special Requests BOTTLES DRAWN AEROBIC AND ANAEROBIC 6CC  Final   Culture NO GROWTH 2 DAYS  Final   Report Status PENDING  Incomplete  Blood Culture (routine x 2)     Status: None (Preliminary result)   Collection Time: 03/31/16  2:53 PM  Result Value Ref Range Status   Specimen Description BLOOD RIGHT FOREARM  Final   Special Requests BOTTLES DRAWN AEROBIC AND ANAEROBIC 6CC  Final   Culture NO GROWTH 2 DAYS  Final   Report Status PENDING  Incomplete  Surgical PCR screen     Status: None   Collection Time: 04/02/16  7:35 AM  Result Value Ref Range Status   MRSA, PCR NEGATIVE NEGATIVE Final   Staphylococcus aureus NEGATIVE NEGATIVE Final    Comment:        The Xpert SA Assay (FDA approved for NASAL specimens in patients over 39 years of age), is one component of a comprehensive surveillance program.  Test performance has been validated by [facility] for patients greater than or equal to 65 year old. It is not intended to diagnose infection nor to guide or monitor treatment.      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

## 2016-04-02 NOTE — Op Note (Signed)
Bronchoscopy Procedure Note Michelle Mendoza [id number] 12/02/1940  Procedure: Bronchoscopy Indications: Diagnostic evaluation of the airways and Obtain specimens for culture and/or other diagnostic studies  Procedure Details Consent: Risks of procedure as well as the alternatives and risks of each were explained to the (patient/caregiver).  Consent for procedure obtained. Time Out: Verified patient identification, verified procedure, site/side was marked, verified correct patient position, special equipment/implants available, medications/allergies/relevent history reviewed, required imaging and test results available.  Performed  In preparation for procedure, bronchoscope lubricated and lidocaine given via ETT (15 ml). Sedation: Benzodiazepines  Airway entered and the following bronchi were examined: RUL, RML, RLL, LUL, LLL and Bronchi.   Procedures performed: Brushings performed Bronchoscope removed.    Evaluation Hemodynamic Status: BP stable throughout; O2 sats: stable throughout Patient's Current Condition: stable Specimens:  Sent serosanguinous fluid and biopsy and bronchial brushing Complications: No apparent complications Patient did tolerate procedure well.   Michelle Mendoza L 04/02/2016

## 2016-04-02 NOTE — Progress Notes (Signed)
She is scheduled for bronchoscopy this afternoon. She understands we have discussed risks versus benefits at length. She wants to proceed

## 2016-04-02 NOTE — Progress Notes (Signed)
PROGRESS NOTE  Michelle Mendoza XTK:[id number] DOB: Jul 28, 1940 DOA: 03/31/2016 PCP: Sallee Lange, MD  Brief Narrative: 75 year old woman presented with history of hoarseness, weight loss, increasing weakness. Workup with CT suggested lung cancer. She was admitted for further evaluation.  Assessment/Plan: 1. Left suprahilar lung mass presumed lung cancer. Status post bronchoscopy 12/20 on a vent well. Washings and biopsy pending. 2. Hypercalcemia secondary to dehydration. Resolved with fluids. 3. Failure to thrive secondary to lung cancer. 4. Acute kidney injury. Resolved. Secondary to failure to thrive. 5. Normocytic, normochromic anemia. Status post 2 units packed red blood cells. Secondary to malignancy. 6. Underweight, severe malnutrition 7. Low TSH. Follow-up as an outpatient. 8. Severe aortic and branch vessel atherosclerosis. Asymptomatic.   Overall feels well, requesting to go home. Discussed with Dr. Luan Pulling, okay to discharge home today per him.  Follow-up with oncology as an outpatient.  Discussed with sister bedside  Murray Hodgkins, MD  [facility] Direct contact: [phone] --Via amion app OR  --[email or web address]; password [email or web address]  7PM-7AM contact night coverage as above 04/02/2016, 3:49 PM  LOS: 2 days   Consultants:  Pulmonology  Oncology  Procedures:  Bronchoscopy with biopsy and brushings  Transfusion 2 units packed red blood cells  Antimicrobials:    Interval history/Subjective: Feeling much better. Breathing fine. Some cough.  Objective: Vitals:   04/02/16 1405 04/02/16 1410 04/02/16 1415 04/02/16 1442  BP: (!) 171/112 (!) 175/95 (!) 169/100 (!) 138/115  Pulse: (!) 107 (!) 108 (!) 107 (!) 102  Resp: 20 (!) 29 (!) 27 20  Temp:    98.2 F (36.8 C)  TempSrc:    Oral  SpO2: 94% (!) 86% (!) 89% 92%  Weight:      Height:        Intake/Output Summary (Last 24 hours) at 04/02/16 1549 Last data filed at 04/02/16 1126  Gross per 24 hour    Intake          2745.58 ml  Output             3300 ml  Net          -554.42 ml     Filed Weights   03/31/16 1136  Weight: 40.8 kg (90 lb)    Exam:    Constitutional:  . Appears calm and comfortable ENMT:  . grossly normal hearing  Respiratory:  . CTA bilaterally, no w/r/r.  . Respiratory effort normal. No retractions or accessory muscle use Cardiovascular:  . RRR, no m/r/g Psychiatric:  . judgement and insight appear normal . Mental status o Mood, affect appropriate  I have personally reviewed following labs and imaging studies:  CT abdomen and pelvis, no evidence of metastatic disease within abdomen or pelvis.  Scheduled Meds: . sodium chloride   Intravenous Once  . docusate sodium  100 mg Oral BID  . enoxaparin (LOVENOX) injection  30 mg Subcutaneous Q24H  . feeding supplement  1 Container Oral Q24H  . lactose free nutrition  237 mL Oral BID BM  . multivitamin-lutein  1 capsule Oral Daily  . nicotine  14 mg Transdermal Daily  . pravastatin  20 mg Oral q1800   Continuous Infusions: . 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

## 2016-04-03 LAB — TYPE AND SCREEN
ABO/RH(D): O NEG
ANTIBODY SCREEN: NEGATIVE
UNIT DIVISION: 0
Unit division: 0

## 2016-04-05 LAB — CULTURE, BLOOD (ROUTINE X 2)
Culture: NO GROWTH
Culture: NO GROWTH

## 2016-04-08 ENCOUNTER — Encounter (HOSPITAL_COMMUNITY): Payer: Self-pay | Admitting: Pulmonary Disease

## 2016-04-11 ENCOUNTER — Encounter (HOSPITAL_COMMUNITY): Payer: Medicare Other | Attending: Hematology & Oncology | Admitting: Hematology & Oncology

## 2016-04-11 ENCOUNTER — Encounter (HOSPITAL_COMMUNITY): Payer: Self-pay | Admitting: Hematology & Oncology

## 2016-04-11 VITALS — BP 113/59 | HR 72 | Temp 97.5°F | Resp 16 | Ht 60.5 in | Wt 95.4 lb

## 2016-04-11 DIAGNOSIS — R634 Abnormal weight loss: Secondary | ICD-10-CM

## 2016-04-11 DIAGNOSIS — C3492 Malignant neoplasm of unspecified part of left bronchus or lung: Secondary | ICD-10-CM

## 2016-04-11 DIAGNOSIS — R490 Dysphonia: Secondary | ICD-10-CM

## 2016-04-11 DIAGNOSIS — Z8551 Personal history of malignant neoplasm of bladder: Secondary | ICD-10-CM | POA: Diagnosis not present

## 2016-04-11 DIAGNOSIS — E43 Unspecified severe protein-calorie malnutrition: Secondary | ICD-10-CM

## 2016-04-11 DIAGNOSIS — C3412 Malignant neoplasm of upper lobe, left bronchus or lung: Secondary | ICD-10-CM | POA: Diagnosis not present

## 2016-04-11 DIAGNOSIS — D649 Anemia, unspecified: Secondary | ICD-10-CM

## 2016-04-11 DIAGNOSIS — Z7189 Other specified counseling: Secondary | ICD-10-CM

## 2016-04-11 LAB — IRON AND TIBC
Iron: 16 ug/dL — ABNORMAL LOW (ref 28–170)
SATURATION RATIOS: 6 % — AB (ref 10.4–31.8)
TIBC: 290 ug/dL (ref 250–450)
UIBC: 274 ug/dL

## 2016-04-11 LAB — COMPREHENSIVE METABOLIC PANEL
ALBUMIN: 3.1 g/dL — AB (ref 3.5–5.0)
ALT: 14 U/L (ref 14–54)
AST: 53 U/L — AB (ref 15–41)
Alkaline Phosphatase: 55 U/L (ref 38–126)
Anion gap: 10 (ref 5–15)
BUN: 57 mg/dL — AB (ref 6–20)
CHLORIDE: 94 mmol/L — AB (ref 101–111)
CO2: 25 mmol/L (ref 22–32)
CREATININE: 1.58 mg/dL — AB (ref 0.44–1.00)
Calcium: 11 mg/dL — ABNORMAL HIGH (ref 8.9–10.3)
GFR calc Af Amer: 36 mL/min — ABNORMAL LOW (ref >60)
GFR, EST NON AFRICAN AMERICAN: 31 mL/min — AB (ref >60)
GLUCOSE: 95 mg/dL (ref 65–99)
POTASSIUM: 4.9 mmol/L (ref 3.5–5.1)
Sodium: 129 mmol/L — ABNORMAL LOW (ref 135–145)
Total Bilirubin: 0.5 mg/dL (ref 0.3–1.2)
Total Protein: 7.7 g/dL (ref 6.5–8.1)

## 2016-04-11 LAB — CBC WITH DIFFERENTIAL/PLATELET
BASOS ABS: 0 10*3/uL (ref 0.0–0.1)
BASOS PCT: 0 %
EOS PCT: 2 %
Eosinophils Absolute: 0.2 10*3/uL (ref 0.0–0.7)
HCT: 36 % (ref 36.0–46.0)
Hemoglobin: 11.3 g/dL — ABNORMAL LOW (ref 12.0–15.0)
LYMPHS PCT: 15 %
Lymphs Abs: 1.8 10*3/uL (ref 0.7–4.0)
MCH: 27.2 pg (ref 26.0–34.0)
MCHC: 31.4 g/dL (ref 30.0–36.0)
MCV: 86.7 fL (ref 78.0–100.0)
MONO ABS: 1.1 10*3/uL — AB (ref 0.1–1.0)
Monocytes Relative: 9 %
NEUTROS ABS: 8.4 10*3/uL — AB (ref 1.7–7.7)
Neutrophils Relative %: 73 %
PLATELETS: 371 10*3/uL (ref 150–400)
RBC: 4.15 MIL/uL (ref 3.87–5.11)
RDW: 16.1 % — AB (ref 11.5–15.5)
WBC: 11.5 10*3/uL — AB (ref 4.0–10.5)

## 2016-04-11 LAB — RETICULOCYTES
RBC.: 4.15 MIL/uL (ref 3.87–5.11)
Retic Count, Absolute: 37.4 10*3/uL (ref 19.0–186.0)
Retic Ct Pct: 0.9 % (ref 0.4–3.1)

## 2016-04-11 LAB — VITAMIN B12: Vitamin B-12: 1498 pg/mL — ABNORMAL HIGH (ref 180–914)

## 2016-04-11 LAB — FERRITIN: Ferritin: 333 ng/mL — ABNORMAL HIGH (ref 11–307)

## 2016-04-11 MED ORDER — MEGESTROL ACETATE 400 MG/10ML PO SUSP: 400.0000 mg | Freq: Two times a day (BID) | ORAL | 2 refills | Status: DC

## 2016-04-11 NOTE — Progress Notes (Signed)
Michelle Mendoza  CONSULT NOTE  Patient Care Team: Kathyrn Drown, MD as PCP - General (Family Medicine)  CHIEF COMPLAINTS/PURPOSE OF CONSULTATION:  Squamous Cell Carcinoma of Lung  HISTORY OF PRESENTING ILLNESS:  Michelle Mendoza 75 y.o. female with a past medical history significant for HTN, fatty liver, hyperlipidemia, renal insufficiency, osteoporosis who was admitted to the [facility] after presenting to the ED on 03/31/2016 with weakness and work-up demonstrating concerns for bronchogenic carcinoma. She was discharged on 04/02/2016. Other symptoms of significance included hoarseness, weight loss. During her hospitalization. She underwent bronchoscopy on 12/20 with biopsy and brushings with Dr. Luan Pulling. She was transfused 2 U PRBC. She was hypercalcemic on admission which promptly resolved with hydration.   CT abdomen pelvis performed on 04/01/2016 showed no evidence of metastatic disease within the abdomen or pelvis. LUL biopsy was positive for squamous cell carcinoma. Specimen one bronchial brushing of the left showed rare atypical cells. Specimen two bronchial brushing of the left showed atypical cells with keratinization suspicious for squamous cell carcinoma.   Ms. Farrugia presents to the [facility] today accompanied by her son and daughter. Her daughter is a Equities trader. The patient lives with her husband who she helps as caregiver.   She has lost about 25 lbs since the end of last year. She has noticed a gradual decline in weight. Her daughter began to notice it around May or June.  The patient states she has only has a cough when her nose runs. Her daughter and son report she have been coughing for a "right good while". The patient notes sputum production is clear in color.  She reports laryngitis and hoarse voice for the last 6 weeks. She was given a round of antibiotics by Dr. Wolfgang Phoenix.   She gets up and gets dressed most everyday. There is a  caregiver who comes by to help with her husband. For the past week, the caregiver has not been able to come daily so the patient has been taking care of her husband.   Her son notes she has been weak all week. She has hardly been down to the store which is abnormal for her.   The daughter reports the patient knew something was going on for while but she wanted to make it through Christmas. After the patient fell, her daughter stayed with her for a week and saw a progress decline in her performance status in that time.  She had bladder cancer almost 11 years ago that was removed in [place].  The patient is here for further evaluation and discussion of squamous cell carcinoma and treatment recommendations.    MEDICAL HISTORY:  Past Medical History:  Diagnosis Date  . Cancer ([place])    bladder  . Elevated transaminase level   . H/O renal artery stenosis   . Hyperlipidemia   . Hypertension   . Osteoporosis     SURGICAL HISTORY: Past Surgical History:  Procedure Laterality Date  . AORTIC VALVULOPLASTY  1995  . CAROTID ENDARTERECTOMY Right   . COLONOSCOPY    . FLEXIBLE BRONCHOSCOPY Bilateral 04/02/2016   Procedure: FLEXIBLE BRONCHOSCOPY;  Surgeon: Sinda Du, MD;  Location: [facility];  Service: Thoracic;  Laterality: Bilateral;  . [place]   "blockages" - ?stenting  . TUBAL LIGATION      SOCIAL HISTORY: Social History   Social History  . Marital status: Married    Spouse name: N/A  . Number of children: N/A  .  Years of education: N/A   Occupational History  . 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 Smoking status: Current Every Day Smoker    Packs/day: 1.00    Types: Cigarettes  . Smokeless tobacco: Never Used     Comment: currently only 4 cigs/day  . Alcohol use No  . Drug use: No  . Sexual activity: Not on file     Comment: married   Other Topics Concern  . Not on file   Social History Narrative  . No narrative on  file   Married for 51 years.  2 children, 1 son and 1 daughter. Her daughter is a Equities trader. 2 granddaughters 5 step great grandchildren Ex smoker, 1/2 ppd. ETOH, never Her hobby was working. She owns a sporting [employer] [place], since 1997. She was born in [place]: Family History  Problem Relation Age of Onset  . Hypertension Sister   . Hypertension Other   . Stroke Other   . Heart attack Other   . Dementia Mother 62    significant ASCVD  . Lung cancer Father 36  . Brain cancer Brother 61   Mother deceased 11 years-old. She had 6 brothers. Father deceased 65 years-old of lung cancer. He was a heavy smoker. He had 4 brothers and 1 sister. Oldest brother died of a brain tumor at 75 yo Two sisters. The younger sister has hypertension. The middle sister has heart issues.  ALLERGIES:  is allergic to penicillins and sulfa antibiotics.  MEDICATIONS:  Current Outpatient Prescriptions  Medication Sig Dispense Refill  . acetaminophen (TYLENOL) 500 MG tablet Take 1,000 mg by mouth every 6 (six) hours as needed for moderate pain.    Marland Kitchen alendronate (FOSAMAX) 70 MG tablet Take 1 tablet (70 mg total) by mouth every 7 (seven) days. Take with a full glass of water on an empty stomach sitting upright. 12 tablet 3  . aspirin 81 MG tablet Take 81 mg by mouth daily.    Marland Kitchen atenolol (TENORMIN) 50 MG tablet TAKE (1) TABLET BY MOUTH TWICE DAILY. 180 tablet 3  . calcium-vitamin D (OSCAL WITH D) 500-200 MG-UNIT per tablet Take 1 tablet by mouth 2 (two) times daily.    Marland Kitchen co-enzyme Q-10 30 MG capsule Take 100 mg by mouth daily.    . Cyanocobalamin (B-12) 250 MCG TABS Take 1 tablet by mouth daily.     Marland Kitchen lisinopril-hydrochlorothiazide (PRINZIDE,ZESTORETIC) 20-12.5 MG tablet Take 1 tablet by mouth every morning. 90 tablet 1  . multivitamin-lutein (OCUVITE-LUTEIN) CAPS capsule Take 1 capsule by mouth daily.    . Omega-3 Fatty Acids (FISH OIL) 1000 MG CAPS Take 1 capsule by  mouth daily.    . pravastatin (PRAVACHOL) 20 MG tablet Take 1 tablet (20 mg total) by mouth daily. 90 tablet 1  . zolpidem (AMBIEN) 5 MG tablet Take 1 tablet (5 mg total) by mouth at bedtime as needed for sleep. 90 tablet 1   No current facility-administered medications for this visit.     Review of Systems  Constitutional: Positive for malaise/fatigue and weight loss (About 25 lbs since the end of last year).  HENT: Negative.        Hoarse voice. Laryngitis.  Eyes: Negative.   Respiratory: Positive for cough and sputum production (clear in color).   Cardiovascular: Negative.   Gastrointestinal: Negative.   Genitourinary: Negative.   Musculoskeletal: Negative.   Skin: Negative.   Neurological: Negative.   Endo/Heme/Allergies:  Negative.   Psychiatric/Behavioral: Negative.   All other systems reviewed and are negative. 14 point ROS was done and is otherwise as detailed above or in HPI   PHYSICAL EXAMINATION: ECOG PERFORMANCE STATUS: 2 - Symptomatic, <50% confined to bed  Vitals:   04/11/16 1521  BP: (!) 113/59  Pulse: 72  Resp: 16  Temp: 97.5 F (36.4 C)   Filed Weights   04/11/16 1521  Weight: 95 lb 6.4 oz (43.3 kg)    Physical Exam  Constitutional: She is oriented to person, place, and time.  Thin, frail in appearance. Well groomed.  HENT:  Head: Normocephalic and atraumatic.  Mouth/Throat: Oropharynx is clear and moist.  hoarse  Eyes: Conjunctivae and EOM are normal. Pupils are equal, round, and reactive to light. No scleral icterus.  Neck: Normal range of motion. Neck supple.  Cardiovascular: Normal rate, regular rhythm and normal heart sounds.   Pulmonary/Chest: Effort normal and breath sounds normal. No respiratory distress.  Abdominal: Soft. Bowel sounds are normal. She exhibits no distension and no mass. There is no tenderness. There is no rebound and no guarding.  Musculoskeletal: Normal range of motion. She exhibits no edema.  Lymphadenopathy:    She has  no cervical adenopathy.  Neurological: She is alert and oriented to person, place, and time. No cranial nerve deficit. Gait normal.  Skin: Skin is warm and dry.  Psychiatric: Mood, memory, affect and judgment normal.  Nursing note and vitals reviewed.   LABORATORY DATA:  I have reviewed the data as listed Lab Results  Component Value Date   WBC 10.5 04/01/2016   HGB 7.4 (L) 04/01/2016   HCT 24.3 (L) 04/01/2016   MCV 84.4 04/01/2016   PLT 398 04/01/2016   CMP     Component Value Date/Time   NA 134 (L) 04/01/2016 0416   NA 140 12/27/2015 0903   K 4.8 04/01/2016 0416   CL 105 04/01/2016 0416   CO2 22 04/01/2016 0416   GLUCOSE 93 04/01/2016 0416   BUN 36 (H) 04/01/2016 0416   BUN 40 (H) 12/27/2015 0903   CREATININE 1.11 (H) 04/01/2016 0416   CREATININE 1.06 06/15/2013 0857   CALCIUM 9.6 04/01/2016 0416   PROT 7.2 12/27/2015 0903   ALBUMIN 3.8 12/27/2015 0903   AST 42 (H) 12/27/2015 0903   ALT 6 12/27/2015 0903   ALKPHOS 46 12/27/2015 0903   BILITOT 0.4 12/27/2015 0903   GFRNONAA 48 (L) 04/01/2016 0416   GFRAA 56 (L) 04/01/2016 0416   PATHOLOGY     RADIOGRAPHIC STUDIES: I have personally reviewed the radiological images as listed and agreed with the findings in the report. Study Result   CLINICAL DATA:  Left upper lobe mass, presumed lung cancer on recent chest CT. Bilateral pulmonary nodules. No abdominal complaints.  EXAM: CT ABDOMEN AND PELVIS WITH CONTRAST  TECHNIQUE: Multidetector CT imaging of the abdomen and pelvis was performed using the standard protocol following bolus administration of intravenous contrast.  CONTRAST:  68m ISOVUE-300 IOPAMIDOL (ISOVUE-300) INJECTION 61%  COMPARISON:  Chest CT 03/31/2016.  Abdominal CT 11/21/2008.  FINDINGS: Lower chest: Small nodules again noted at both lung bases, likely metastases. No significant pleural or pericardial effusion. There is aortic atherosclerosis.  Hepatobiliary: The liver is normal in  density without focal abnormality. No evidence of gallstones, gallbladder wall thickening or biliary dilatation.  Pancreas: Unremarkable. No pancreatic ductal dilatation or surrounding inflammatory changes.  Spleen: Normal in size without focal abnormality.  Adrenals/Urinary Tract: The adrenal glands appear stable without  suspicious findings. There are fairly extensive renovascular calcifications bilaterally. No definite urinary tract calculus, hydronephrosis or delay in contrast excretion. There are small bilateral renal cysts. 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 The bladder appears unremarkable.  Stomach/Bowel: No evidence of bowel wall thickening, distention or surrounding inflammatory change. The appendix appears normal.  Vascular/Lymphatic: There are no enlarged abdominal or pelvic lymph nodes. Extensive aortic and branch vessel atherosclerosis status post aorto bi-iliac grafting. There are probable renal artery grafts bilaterally. No evidence of acute vascular occlusion.  Reproductive: The uterus and ovaries appear normal.  Other: There are several small midline abdominal hernias containing only fat. No herniated bowel. No ascites.  Musculoskeletal: No acute or significant osseous findings. Convex left scoliosis with associated multilevel spondylosis.  IMPRESSION: 1. No evidence of metastatic disease within the abdomen or pelvis. 2. 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 3. Bilateral renal cysts.   Electronically Signed   By: Richardean Sale M.D.   On: 04/02/2016 09:55  Study Result   CLINICAL DATA:  75 y/o  F;  EXAM: CT CHEST WITHOUT CONTRAST  TECHNIQUE: Multidetector CT imaging of the chest was performed following the standard protocol without IV contrast.  COMPARISON:  03/31/2016 chest radiograph  FINDINGS: Cardiovascular: Aortic  atherosclerosis with moderate calcification. The descending thoracic aorta measures up to 36 mm at the hilum (series 5, image 59). Normal caliber main pulmonary artery. Normal heart size. No pericardial effusion. Severe coronary artery calcification. Aortic valvular calcification.  Mediastinum/Nodes: Mediastinal lymphadenopathy predominantly at the sub- carinal, right and left lower paratracheal, and AP window stations for example conglomerate precarinal lymph nodes measure approximately 21 x 31 mm (series 2 image 61).  Lungs/Pleura: Left suprahilar masslike opacity measures approximately 68 x 37 x 64 mm (AP x ML x CC) series 2, image 49 series 5, image 50. The bronchi extending to the apical posterior probably anterior segments are occluded with atelectasis of the anterior segment. Throughout the apical posterior segment. 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 There are numerous pulmonary nodules throughout the lungs bilaterally the largest on the right subjacent pleura and upper lobe measuring 12 x 18 mm (series 3 image 86.  Upper Abdomen: No acute abnormality.  Musculoskeletal: No chest wall mass or suspicious bone lesions identified.  IMPRESSION: 1. Left suprahilar mass measuring up to 6.8 cm. There is occlusion of left upper lobe segmental bronchi and distinguishing mass from atelectasis is suboptimal without intravenous contrast. This probably represents primary lung cancer, possible metastatic disease. 2. Numerous pulmonary nodules in the lungs bilaterally are likely metastatic. 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 3. Lower mediastinal lymphadenopathy is likely metastatic. 4. Aortic valvular calcification associated with aortic  stenosis. Severe coronary artery calcification. 5. 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 Recommend annual imaging followup by CTA or MRA. This recommendation follows 2010 ACCF/AHA/AATS/ACR/ASA/SCA/SCAI/SIR/STS/SVM Guidelines for the Diagnosis and Management of Patients with Thoracic Aortic Disease. Circulation.2010; 121: L275-T700   Electronically Signed   By: Kristine Garbe M.D.   On: 03/31/2016 16:28  Study Result   CLINICAL DATA:  Upper respiratory infection. Fall 1 week ago. Weakness and dizziness. Headache.  EXAM: CT HEAD WITHOUT CONTRAST  TECHNIQUE: Contiguous axial images were obtained from the base of the skull through the vertex without  intravenous contrast.  COMPARISON:  None.  FINDINGS: Brain: No mass lesion, intraparenchymal hemorrhage or extra-axial collection. No evidence of acute cortical infarct. Focal hypodensity within the right the anterior temporal pole is favored to be a large perivascular space or neuroepithelial cyst. Otherwise, there is periventricular hypoattenuation compatible with chronic microvascular ischemia.  Vascular: Atherosclerotic calcification of the vertebral and internal carotid arteries at the skullbase.  Skull: Normal visualized skull base, calvarium and extracranial soft tissues.  Sinuses/Orbits: No sinus fluid levels or advanced mucosal thickening. No mastoid effusion. Normal orbits.  IMPRESSION: 1. No acute intracranial abnormality. 2. Chronic microvascular ischemia.   Electronically Signed   By: Ulyses Jarred M.D.   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 Results are noted above. Unfortunately there is not enough tissue for  molecular testing or PDL1 testing.  I have recommended guardant. The patient is not up for another biopsy.   Additional labs will be drawn after our visit today, these are listed in the orders below.  .We suspect she has metastatic disease with nodules in both lungs. Will arrange for PET/CT. I reviewed the NCCN guidelines for non-small cell lung cancer with the patient and her family. She was given a copy of these guidelines. We discussed treatment recommendations including immunotherapy, chemotherapy, and radiation therapy.   The patient's performance status has been progressively declining. The patient and her family are interested in starting treatment as quickly as possible. We will begin treatment post-PET scan. She is of marginal PS, I discussed this with them. We did discuss goals of care. She is currently a full code.  I would recommend Carboplatin/gemzar. If her tolerance is poor, will discuss other options including single agent vs. Immunotherapy (could at that point consider 2nd line) or palliative options) She may need palliatve XRT to the upper lobe mass.   We will made additional treatment recommendations after all testing has returned.  The patient met with our patient navigator [place] today. She will be scheduled for chemotherapy teaching.  She will return for follow up with her first treatment cycle. Nutrition consultation, megace given to stimulate appetite.   ORDERS PLACED FOR THIS ENCOUNTER:  Orders Placed This Encounter  Procedures  . NM PET Image Initial (PI) Skull Base To Thigh    Standing Status:   Future    Number of Occurrences:   1    Standing Expiration Date:   04/11/2017    Order Specific Question:   Reason for Exam (SYMPTOM  OR DIAGNOSIS REQUIRED)    Answer:   staging NSCLC    Order Specific Question:   Preferred imaging location?    Answer:   [facility]    Order Specific Question:   If indicated for the ordered procedure, I authorize the  administration of a radiopharmaceutical per Radiology protocol    Answer:   Yes  . IR FLUORO GUIDE PORT INSERTION RIGHT    Standing Status:   Future    Number of Occurrences:   1    Standing Expiration Date:   06/11/2017    Order Specific Question:   Reason for Exam (SYMPTOM  OR DIAGNOSIS REQUIRED)    Answer:   lung cancer    Order Specific Question:   Preferred Imaging Location?    Answer:  [facility]  . CBC with Differential  . Comprehensive metabolic panel  . Ferritin  . Iron and TIBC  . Vitamin B12  . Folate  . Reticulocytes    MEDICATIONS PRESCRIBED THIS ENCOUNTER: No orders of the defined types were placed in this encounter.  Meds ordered this encounter  Medications  . megestrol (MEGACE) 400 MG/10ML suspension    Sig: Place 10 mLs (400 mg total) into feeding tube 2 (two) times daily.    Dispense:  320 mL    Refill:  2    All questions were answered. The patient knows to call the clinic with any problems, questions or concerns.  This document serves as a record of services personally performed by Ancil Linsey, MD. It was created on her behalf by Arlyce Harman, a trained medical scribe. The creation of this record is based on the scribe's personal observations and the provider's statements to them. This document has been checked and approved by the attending provider.  I have reviewed the above documentation for accuracy and completeness and I agree with the above.  This note was electronically signed.   Molli Hazard, MD  04/11/2016 4:02 PM

## 2016-04-11 NOTE — Patient Instructions (Signed)
[place] at [facility] Discharge Instructions  RECOMMENDATIONS MADE BY THE CONSULTANT AND ANY TEST RESULTS WILL BE SENT TO YOUR REFERRING PHYSICIAN.  You saw Dr.Penland today. See Amy at checkout for appointments.  Thank you for choosing [place] at [facility] to provide your oncology and hematology care.  To afford each patient quality time with our provider, please arrive at least 15 minutes before your scheduled appointment time.    If you have a lab appointment with the [facility] please come in thru the  Main Entrance and check in at the main information desk  You need to re-schedule your appointment should you arrive 10 or more minutes late.  We strive to give you quality time with our providers, and arriving late affects you and other patients whose appointments are after yours.  Also, if you no show three or more times for appointments you may be dismissed from the clinic at the providers discretion.     Again, thank you for choosing [facility].  Our hope is that these requests will decrease the amount of time that you wait before being seen by our physicians.       _____________________________________________________________  Should you have questions after your visit to [facility], please contact our office at [phone] between the hours of 8:30 a.m. and 4:30 p.m.  Voicemails left after 4:30 p.m. will not be returned until the following business day.  For prescription refill requests, have your pharmacy contact our office.       Resources For Cancer Patients and their Caregivers ? American Cancer Society: Can assist with transportation, wigs, general needs, runs Look Good Feel Better.        (979)033-6149 ? Cancer Care: Provides financial assistance, online support groups, medication/co-pay assistance.  1-800-813-HOPE 8783149342) ? [place] Assists [place]  cancer patients and their families through emotional , educational and financial support.  925 125 5016 ? [place] DSS Where to apply for food stamps, Medicaid and utility assistance. 561-077-4146 ? [facility]: Transportation to medical appointments. 719-632-5481 ? Social Security Administration: May apply for disability if have a Stage IV cancer. (903)548-2616 219-390-2377 ? [employer], Disability and Transit Services: Assists with nutrition, care and transit needs. [place] Support Programs: '@10RELATIVEDAYS'$ @ > Cancer Support Group  2nd Tuesday of the month 1pm-2pm, Journey Room  > Creative Journey  3rd Tuesday of the month 1130am-1pm, Journey Room  > Look Good Feel Better  1st Wednesday of the month 10am-12 noon, Journey Room (Call [place] to register [phone])

## 2016-04-11 NOTE — Progress Notes (Signed)
Met with pt face to face.  Introduced myself and explain a little bit about my role as the patient navigator.  Pt given a card with all my information on it.  Told pt to call if they had any questions or concerns.  Pt verbalized understanding.   PET scan, teaching, port placement.  She will then start carbo/gemzar

## 2016-04-12 LAB — FOLATE: FOLATE: 52.5 ng/mL (ref >5.9)

## 2016-04-15 ENCOUNTER — Other Ambulatory Visit (HOSPITAL_COMMUNITY): Payer: Self-pay | Admitting: Emergency Medicine

## 2016-04-15 ENCOUNTER — Telehealth (HOSPITAL_COMMUNITY): Payer: Self-pay | Admitting: Emergency Medicine

## 2016-04-15 ENCOUNTER — Other Ambulatory Visit: Payer: Self-pay | Admitting: General Surgery

## 2016-04-15 NOTE — Telephone Encounter (Signed)
Called pts daughter Jeannene Patella to let her know that we scheduled the pts PET scan for [place] on 04/21/2016.  Arrive at 10 nothing to eat or drink after midnight and no gum, candy, or cough drops.  Port placement at [facility] 04/16/2016 at 9:30.  Teaching on 04/23/2016 at 11:30.  Start chemotherapy 04/24/2016 at 10:30 am.  She verbalized understanding.

## 2016-04-15 NOTE — Patient Instructions (Addendum)
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  We are going to treat you with carboplatin and gemzar. One cycle is every 21 days.  You will be treated on day 1 and day 8 every 21 days.  On day 1 you will be treated with carboplatin and gemzar and on day 8 you will be treated with gemzar.  This treatment is with palliative intent, which means you are treatable but not curable.   You will see the doctor regularly throughout treatment.  We monitor your lab work prior to every treatment.  The doctor monitors your response to treatment by the way you are feeling, your blood work, and scans periodically.  You will receive the following the following pre-medications prior to each chemotherapy: Premeds: Aloxi - high powered nausea/vomiting prevention medication used for chemotherapy patients.  Dexamethasone - steroid - given to reduce the risk of you having an allergic type reaction to the chemotherapy. Dex can cause you to feel energized, nervous/anxious/jittery, make you have trouble sleeping, and/or make you feel hot/flushed in the face/neck and/or look pink/red in the face/neck. These side effects will pass as the Dex wears off. (takes 20 minutes to infuse)  Compazine:  Nausea pill pre-medication given on day 8 prior to Gemzar    POTENTIAL SIDE EFFECTS OF TREATMENT:  Carboplatin (Generic Name) Other Names: Paraplatin, CBDCA  About This Drug Carboplatin is a drug used to treat cancer. This drug is given in the vein (IV).  Possible Side Effects (More Common) . Nausea and throwing up (vomiting). These symptoms may happen within a few hours after your treatment and may last up to 24 hours. Medicines are available to stop or lessen these side effects. . Bone marrow depression. This is a decrease in the number of white blood cells, red blood cells, and platelets. This may raise your risk of infection, make  you tired and weak (fatigue), and raise your risk of bleeding. . Soreness of the mouth and throat. You may have red areas, white patches, or sores that hurt. . This drug may affect how your kidneys work. Your kidney function will be checked as needed. . Electrolyte changes. Your blood will be checked for electrolyte changes as needed.  Possible Side Effects (Less Common) . Hair loss. Some patients lose their hair on the scalp and body. You may notice your hair thinning seven to 14 days after getting this drug. . Effects on the nerves are called peripheral neuropathy. You may feel numbness, tingling, or pain in your hands and feet. It may be hard for you to button your clothes, open jars, or walk as usual. The effect on the nerves may get worse with more doses of the drug. These effects get better in some people after the drug is stopped but it does not get better in all people. . Loose bowel movements (diarrhea) that may last for several days . Decreased hearing or ringing in the ears . Changes in the way food and drinks taste . Changes in liver function. Your liver function will be checked as needed.  Allergic Reactions Serious allergic reactions including anaphylaxis are rare. While you are getting this drug in your vein (IV), tell your nurse right away if you have any of these symptoms of an allergic reaction: . Trouble catching your breath . Feeling like your tongue or throat are swelling . Feeling your heart beat quickly or in a  not normal way (palpitations) . Feeling dizzy or lightheaded . Flushing, itching, rash, and/or hives  Treating Side Effects . Drink 6-8 cups of fluids each day unless your doctor has told you to limit your fluid intake due to some other health problem. A cup is 8 ounces of fluid. If you throw up or have loose bowel movements, you should drink more fluids so that you do not become dehydrated (lack water in the body from losing too much fluid). . Mouth care  is very important. Your mouth care should consist of routine, gentle cleaning of your teeth or dentures and rinsing your mouth with a mixture of 1/2 teaspoon of salt in 8 ounces of water or  teaspoon of baking soda in 8 ounces of water. This should be done at least after each meal and at bedtime. . If you have mouth sores, avoid mouthwash that has alcohol. Avoid alcohol and smoking because they can bother your mouth and throat. . If you have numbness and tingling in your hands and feet, be careful when cooking, walking, and handling sharp objects and hot liquids. . Talk with your nurse about getting a wig before you lose your hair. Also, call the [place] at [phone]-ACS-2345 to find out information about the "Look Good, Feel Better" program close to where you live. It is a free program where women getting chemotherapy can learn about wigs, turbans and scarves as well as makeup techniques and skin and nail care.  Food and Drug Interactions There are no known interactions of carboplatin with food. This drug may interact with other medicines. Tell your doctor and pharmacist about all the medicines and dietary supplements (vitamins, minerals, herbs and others) that you are taking at this time. The safety and use of dietary supplements and alternative diets are often not known. Using these might affect your cancer or interfere with your treatment. Until more is known, you should not use dietary supplements or alternative diets without your cancer doctor's help.  When to Call the Doctor Call your doctor or nurse right away if you have any of these symptoms: . Fever of 100.5 F (38 C) or above; chills . Bleeding or bruising that is not normal . Wheezing or trouble breathing . Nausea that stops you from eating or drinking . Throwing up more than once a day . Rash or itching . Loose bowel movements (diarrhea) more than four times a day or diarrhea with weakness or  feeling lightheaded . Call your doctor or nurse as soon as possible if any of these symptoms happen: . Numbness, tingling, decreased feeling or weakness in fingers, toes, arms, or legs . Change in hearing, ringing in the ears . Blurred vision or other changes in eyesight . Decreased urine . Yellowing of skin or eyes  Sexual Problems and Reproductive Concerns Sexual problems and reproduction concerns may happen. In both men and women, this drug may affect your ability to have children. This cannot be determined before your treatment. Talk with your doctor or nurse if you plan to have children. Ask for information on sperm or egg banking. In men, this drug may interfere with your ability to make sperm, but it should not change your ability to have sexual relations. In women, menstrual bleeding may become irregular or stop while you are getting this drug. Do not assume that you cannot become pregnant if you do not have a menstrual period. Women may go through signs of menopause (change of life) like vaginal dryness  or itching. Vaginal lubricants can be used to lessen vaginal dryness, itching, and pain during sexual relations. Genetic counseling is available for you to talk about the effects of this drug therapy on future pregnancies. Also, a genetic counselor can look at the possible risk of problems in the unborn baby due to this medicine if an exposure happens during pregnancy. . Pregnancy warning: This drug may have harmful effects on the unborn child, so effective methods of birth control should be used during your cancer treatment. . Breast feeding warning: It is not known if this drug passes into breast milk. For this reason, women should talk to their doctor about the risks and benefits of breast feeding during treatment with this drug because this drug may enter the breast milk and badly harm a breast feeding baby.  Gemcitabine (Generic Name) Other Names: Gemzar  About This  Drug Gemcitabine is used to treat cancer. This drug is given in the vein (IV).  Possible Side Effects (Most Common) . Bone marrow depression. This is a decrease in the number of white blood cells, red blood cells, and platelets. This may raise your risk of infection, make you tired and weak (fatigue), and raise your risk of bleeding. . Nausea and throwing up (vomiting). These symptoms may happen within a few hours after your treatment and may last up to several days. Medicines are available to stop or lessen these side effects. . Loose bowel movements (diarrhea) that may last for a few days . Fluid retention. You may have swelling of your arms, legs, face, chest or abdomen . Raised, red rash on arms, legs, back, or chest . Flu-like symptoms: fever, headache, muscle and joint aches, and fatigue (low energy, feeling weak) . Changes in your liver function. Your doctor will check your liver function as needed.  Possible Side Effects (Less Common) . Feeling short of breath . Decreased appetite (decreased hunger) . Effects on the nerves are called peripheral neuropathy. You may feel numbness, tingling, or pain in your hands and feet. It may be hard for you to button your clothes, open jars, or walk as usual. The effect on the nerves may get worse with more doses of the drug. These effects get better in some people after the drug is stopped but it does not get better in all people.  Treating Side Effects . Ask your doctor or nurse about medication that is available to help stop or lessen nausea, throwing up, loose bowel movements, headache, muscle and joint aches. . Drink 6-8 cups of fluids each day unless your doctor has told you to limit your fluid intake due to some other health problem. A cup is 8 ounces of fluid. If you throw up or have loose bowel movements you should drink more fluids so that you do not become dehydrated (lack water in the body due to losing too much fluid). . If you  get a rash, do not put anything on it unless your doctor or nurse says you may. Keep the area around the rash clean and dry. Ask your doctor for medicine if your rash bothers you. . If you have numbness and tingling in your hands and feet, be careful when cooking, walking, and handling sharp objects and hot liquids.  Food and Drug Interactions There are no known interactions of gemcitabine with food. This drug may interact with other medicines. Tell your doctor and pharmacist about all the medicines and dietary supplements (vitamins, minerals, herbs and others) that you are taking  at this time. The safety and use of dietary supplements and alternative diets are often not known. Using these might affect your cancer or interfere with your treatment. Until more is known, you should not use dietary supplements or alternative diets without your cancer doctor's help.  When to Call the Doctor Call your doctor or nurse right away if you have any of these symptoms: . Fever of 100.5 F (38 C) or above . Chills . Bleeding or bruising that is not normal . Wheezing or trouble breathing . Rash or itching . Nausea that stops you from eating or drinking . Loose bowel movements (diarrhea) more than 4 times a day or diarrhea with weakness or lightheadedness . Swelling of your arms, legs, face, chest or abdomen (fluid retention) Call your doctor or nurse as soon as possible if any of these symptoms happen: . Numbness, tingling, decreased feeling or weakness in fingers, toes, arms, or legs . Trouble walking or changes in the way you walk, feeling clumsy when buttoning clothes, opening jars, or other routine hand motions . Weight gain of 5 pounds in one week (fluid retention) . Lasting loss of appetite or rapid weight loss of five pounds in a week . Fatigue that interferes with your daily activities  Sexual Problems and Reproduction Concerns . Infertility warning: Sexual problems and reproduction concerns  may happen. In both men and women, this drug may affect your ability to have children. This cannot be determined before your treatment. Talk with your doctor or nurse if you plan to have children. Ask for information on sperm or egg banking. . In men, this drug may interfere with your ability to make sperm, but it should not change your ability to have sexual relations. . In women, menstrual bleeding may become irregular or stop while you are getting this drug. Do not assume that you cannot become pregnant if you do not have a menstrual period. . Women may go through signs of menopause (change of life) like vaginal dryness or itching. Vaginal lubricants can be used to lessen vaginal dryness, itching, and pain during sexual relations. . Genetic counseling is available for you to talk about the effects of this drug therapy on future pregnancies. Also, a genetic counselor can look at the possible risk of problems in the unborn baby due to this medicine if an exposure happens during pregnancy. . Pregnancy warning: This drug may have harmful effects on the unborn child, so effective methods of birth control should be used during your cancer treatment. . Breast feeding warning: It is not known if this drug passes into breast milk. For this reason, women should talk to their doctor about the risks and benefits of breast feeding during treatment with this drug because this drug may enter the breast milk and badly harm a breast feeding baby.   EDUCATIONAL MATERIALS GIVEN AND REVIEWED: Chemotherapy and you book given, nutrition book given, information on gemzar and carboplatin.    SELF CARE ACTIVITIES WHILE ON CHEMOTHERAPY: Hydration Increase your fluid intake 48 hours prior to treatment and drink at least 8 to 12 cups (64 ounces) of water/decaff beverages per day after treatment. You can still have your cup of coffee or soda but these beverages do not count as part of your 8 to 12 cups that you  need to drink daily. No alcohol intake.  Medications Continue taking your normal prescription medication as prescribed.  If you start any new herbal or new supplements please let us know first to  make sure it is safe.  Mouth Care Have teeth cleaned professionally before starting treatment. Keep dentures and partial plates clean. Use soft toothbrush and do not use mouthwashes that contain alcohol. Biotene is a good mouthwash that is available at most pharmacies or may be ordered by calling [phone]. Use warm salt water gargles (1 teaspoon salt per 1 quart warm water) before and after meals and at bedtime. Or you may rinse with 2 tablespoons of three-percent hydrogen peroxide mixed in eight ounces of water. If you are still having problems with your mouth or sores in your mouth please call the clinic. If you need dental work, please let Dr. Whitney Muse know before you go for your appointment so that we can coordinate the best possible time for you in regards to your chemo regimen. You need to also let your dentist know that you are actively taking chemo. We may need to do labs prior to your dental appointment.   Skin Care Always use sunscreen that has not expired and with SPF (Sun Protection Factor) of 50 or higher. Wear hats to protect your head from the sun. Remember to use sunscreen on your hands, ears, face, & feet.  Use good moisturizing lotions such as udder cream, eucerin, or even Vaseline. Some chemotherapies can cause dry skin, color changes in your skin and nails.    . Avoid long, hot showers or baths. . Use gentle, fragrance-free soaps and laundry detergent. . Use moisturizers, preferably creams or ointments rather than lotions because the thicker consistency is better at preventing skin dehydration. Apply the cream or ointment within 15 minutes of showering. Reapply moisturizer at night, and moisturize your hands every time after you wash them.  Hair Loss (if your doctor says your hair  will fall out)  . If your doctor says that your hair is likely to fall out, decide before you begin chemo whether you want to wear a wig. You may want to shop before treatment to match your hair color. . Hats, turbans, and scarves can also camouflage hair loss, although some people prefer to leave their heads uncovered. If you go bare-headed outdoors, be sure to use sunscreen on your scalp. . Cut your hair short. It eases the inconvenience of shedding lots of hair, but it also can reduce the emotional impact of watching your hair fall out. . Don't perm or color your hair during chemotherapy. Those chemical treatments are already damaging to hair and can enhance hair loss. Once your chemo treatments are done and your hair has grown back, it's OK to resume dyeing or perming hair. With chemotherapy, hair loss is almost always temporary. But when it grows back, it may be a different color or texture. In older adults who still had hair color before chemotherapy, the new growth may be completely gray.  Often, new hair is very fine and soft.  Infection Prevention Please wash your hands for at least 30 seconds using warm soapy water. Handwashing is the #1 way to prevent the spread of germs. Stay away from sick people or people who are getting over a cold. If you develop respiratory systems such as green/yellow mucus production or productive cough or persistent cough let us know and we will see if you need an antibiotic. It is a good idea to keep a pair of gloves on when going into grocery stores/[employer] to decrease your risk of coming into contact with germs on the carts, etc. Carry alcohol hand gel with you at all  times and use it frequently if out in public. If your temperature reaches 100.5 or higher please call the clinic and let us know.  If it is after hours or on the weekend please go to the ER if your temperature is over 100.5.  Please have your own personal thermometer at home to use.    Sex and bodily  fluids If you are going to have sex, a condom must be used to protect the person that isn't taking chemotherapy. Chemo can decrease your libido (sex drive). For a few days after chemotherapy, chemotherapy can be excreted through your bodily fluids.  When using the toilet please close the lid and flush the toilet twice.  Do this for a few day after you have had chemotherapy.     Effects of chemotherapy on your sex life Some changes are simple and won't last long. They won't affect your sex life permanently. Sometimes you may feel: . too tired . not strong enough to be very active . sick or sore  . not in the mood . anxious or low Your anxiety might not seem related to sex. For example, you may be worried about the cancer and how your treatment is going. Or you may be worried about money, or about how you family are coping with your illness. These things can cause stress, which can affect your interest in sex. It's important to talk to your partner about how you feel. Remember - the changes to your sex life don't usually last long. There's usually no medical reason to stop having sex during chemo. The drugs won't have any long term physical effects on your performance or enjoyment of sex. Cancer can't be passed on to your partner during sex  Contraception It's important to use reliable contraception during treatment. Avoid getting pregnant while you or your partner are having chemotherapy. This is because the drugs may harm the baby. Sometimes chemotherapy drugs can leave a man or woman infertile.  This means you would not be able to have children in the future. You might want to talk to someone about permanent infertility. It can be very difficult to learn that you may no longer be able to have children. Some people find counselling helpful. There might be ways to preserve your fertility, although this is easier for men than for women. You may want to speak to a fertility expert. You can talk about  sperm banking or harvesting your eggs. You can also ask about other fertility options, such as donor eggs. If you have or have had breast cancer, your doctor might advise you not to take the contraceptive pill. This is because the hormones in it might affect the cancer.  It is not known for sure whether or not chemotherapy drugs can be passed on through semen or secretions from the vagina. Because of this some doctors advise people to use a barrier method if you have sex during treatment. This applies to vaginal, anal or oral sex. Generally, doctors advise a barrier method only for the time you are actually having the treatment and for about a week after your treatment. Advice like this can be worrying, but this does not mean that you have to avoid being intimate with your partner. You can still have close contact with your partner and continue to enjoy sex.  Animals If you have cats or birds we just ask that you not change the litter or change the cage.  Please have someone else do this for  you while you are on chemotherapy.   Food Safety During and After Cancer Treatment Food safety is important for people both during and after cancer treatment. Cancer and cancer treatments, such as chemotherapy, radiation therapy, and stem cell/bone marrow transplantation, often weaken the immune system. This makes it harder for your body to protect itself from foodborne illness, also called food poisoning. Foodborne illness is caused by eating food that contains harmful bacteria, parasites, or viruses.  Foods to avoid Some foods have a higher risk of becoming tainted with bacteria. These include: Marland Kitchen Unwashed fresh fruit and vegetables, especially leafy vegetables that can hide dirt and other contaminants . Raw sprouts, such as alfalfa sprouts . Raw or undercooked beef, especially ground beef, or other raw or undercooked meat and poultry . Fatty, fried, or spicy foods immediately before or after treatment.  These  can sit heavy on your stomach and make you feel nauseous. . Raw or undercooked shellfish, such as oysters. . Sushi and sashimi, which often contain raw fish.  . Unpasteurized beverages, such as unpasteurized fruit juices, raw milk, raw yogurt, or cider . Undercooked eggs, such as soft boiled, over easy, and poached; raw, unpasteurized eggs; or foods made with raw egg, such as homemade raw cookie dough and homemade mayonnaise Simple steps for food safety Shop smart. . Do not buy food stored or displayed in an unclean area. . Do not buy bruised or damaged fruits or vegetables. . Do not buy cans that have cracks, dents, or bulges. . Pick up foods that can spoil at the end of your shopping trip and store them in a cooler on the way home. Prepare and clean up foods carefully. . Rinse all fresh fruits and vegetables under running water, and dry them with a clean towel or paper towel. . Clean the top of cans before opening them. . After preparing food, wash your hands for 20 seconds with hot water and soap. Pay special attention to areas between fingers and under nails. . Clean your utensils and dishes with hot water and soap. Marland Kitchen Disinfect your kitchen and cutting boards using 1 teaspoon of liquid, unscented bleach mixed into 1 quart of water.   Dispose of old food. . Eat canned and packaged food before its expiration date (the "use by" or "best before" date). . Consume refrigerated leftovers within 3 to 4 days. After that time, throw out the food. Even if the food does not smell or look spoiled, it still may be unsafe. Some bacteria, such as Listeria, can grow even on foods stored in the refrigerator if they are kept for too long. Take precautions when eating out. . At restaurants, avoid buffets and salad bars where food sits out for a long time and comes in contact with many people. Food can become contaminated when someone with a virus, often a norovirus, or another "bug" handles it. . Put any  leftover food in a "to-go" container yourself, rather than having the server do it. And, refrigerate leftovers as soon as you get home. . Choose restaurants that are clean and that are willing to prepare your food as you order it cooked.    MEDICATIONS:  Zofran/Ondansetron '8mg'$  tablet. Take 1 tablet every 8 hours as needed for nausea/vomiting. (#1 nausea med to take, this can constipate)  Compazine/Prochlorperazine '10mg'$  tablet. Take 1 tablet every 6 hours as needed for nausea/vomiting. (#2 nausea med to take, this can make you sleepy)   EMLA cream. Apply a quarter size amount to  port site 1 hour prior to chemo. Do not rub in. Cover with plastic wrap.   Over-the-Counter Meds:  Miralax 1 capful in 8 oz of fluid daily. May increase to two times a day if needed. This is a stool softener. If this doesn't work proceed you can add:  Senokot S-start with 1 tablet two times a day and increase to 4 tablets two times a day if needed. (total of 8 tablets in a 24 hour period). This is a stimulant laxative.   Call us if this does not help your bowels move.   Imodium '2mg'$  capsule. Take 2 capsules after the 1st loose stool and then 1 capsule every 2 hours until you go a total of 12 hours without having a loose stool. Call the [facility] if loose stools continue. If diarrhea occurs @ bedtime, take 2 capsules @ bedtime. Then take 2 capsules every 4 hours until morning. Call [facility].    Constipation Sheet *Miralax in 8 oz of fluid daily.  May increase to two times a day if needed.  This is a stool softener.  If this not enough to keep your bowel regular:  You can add:  *Senokot S, start with one tablet twice a day and can increase to 4 tablets twice a day if needed.  This is a stimulant laxative.   Sometimes when you take pain medication you need BOTH a medicine to keep your stool soft and a medicine to help your bowel push it out!  Please call if the above does not work for you.   Do not  go more than 2 days without a bowel movement.  It is very important that you do not become constipated.  It will make you feel sick to your stomach (nausea) and can cause abdominal pain and vomiting.    Diarrhea Sheet  If you are having loose stools/diarrhea, please purchase Imodium and begin taking as outlined:  At the first sign of poorly formed or loose stools you should begin taking Imodium(loperamide) 2 mg capsules.  Take two caplets ('4mg'$ ) followed by one caplet ('2mg'$ ) every 2 hours until you have had no diarrhea for 12 hours.  During the night take two caplets ('4mg'$ ) at bedtime and continue every 4 hours during the night until the morning.  Stop taking Imodium only after there is no sign of diarrhea for 12 hours.    Always call the [facility] if you are having loose stools/diarrhea that you can't get under control.  Loose stools/disrrhea leads to dehydration (loss of water) in your body.  We have other options of trying to get the loose stools/diarrhea to stopped but you must let us know!     Nausea Sheet  Zofran/Ondansetron '8mg'$  tablet. Take 1 tablet every 8 hours as needed for nausea/vomiting. (#1 nausea med to take, this can constipate)  Compazine/Prochlorperazine '10mg'$  tablet. Take 1 tablet every 6 hours as needed for nausea/vomiting. (#2 nausea med to take, this can make you sleepy)  You can take these medications together or separately.  We would first like for you to try the Ondansetron by itself and then take the Prochloperizine if needed. But you are allowed to take both medications at the same time if your nausea is that severe.  If you are having persistent nausea (nausea that does not stop) please take these medications on a staggered schedule so that the nausea medication stays in your body.  Please call the [facility] and let us know the  amount of nausea that you are experiencing.  If you begin to vomit, you need to call the [facility] and if it is the weekend and you have  vomited more than one time and cant get it to stop-go to the Emergency Room.  Persistent nausea/vomiting can lead to dehydration (loss of fluid in your body) and will make you feel terrible.   Ice chips, sips of clear liquids, foods that are @ room temperature, crackers, and toast tend to be better tolerated.    SYMPTOMS TO REPORT AS SOON AS POSSIBLE AFTER TREATMENT:  FEVER GREATER THAN 100.5 F  CHILLS WITH OR WITHOUT FEVER  NAUSEA AND VOMITING THAT IS NOT CONTROLLED WITH YOUR NAUSEA MEDICATION  UNUSUAL SHORTNESS OF BREATH  UNUSUAL BRUISING OR BLEEDING  TENDERNESS IN MOUTH AND THROAT WITH OR WITHOUT PRESENCE OF ULCERS  URINARY PROBLEMS  BOWEL PROBLEMS  UNUSUAL RASH    Wear comfortable clothing and clothing appropriate for easy access to any Portacath or PICC line. Let us know if there is anything that we can do to make your therapy better!    What to do if you need assistance after hours or on the weekends: CALL [phone].  HOLD on the line, do not hang up.  You will hear multiple messages but at the end you will be connected with a nurse triage line.  They will contact Dr Whitney Muse if necessary.  Most of the time they will be able to assist you.   Do not call the hospital operator.  Dr Whitney Muse will not answer phone calls received by them.     I have been informed and understand all of the instructions given to me and have received a copy. I have been instructed to call the clinic [phone] or my family physician as soon as possible for continued medical care, if indicated. I do not have any more questions at this time but understand that I may call the [facility] or the Patient Navigator at [phone] during office hours should I have questions or need assistance in obtaining follow-up care.

## 2016-04-15 NOTE — Progress Notes (Signed)
Fluids ordered, sodium low and kidney function increased.  Pt feels weak.  She will come in Thursday for some fluids.  NS over 2 hours.  This is ok with Dr Whitney Muse.

## 2016-04-16 ENCOUNTER — Other Ambulatory Visit (HOSPITAL_COMMUNITY): Payer: Self-pay | Admitting: Hematology & Oncology

## 2016-04-16 ENCOUNTER — Ambulatory Visit (HOSPITAL_COMMUNITY)
Admission: RE | Admit: 2016-04-16 | Discharge: 2016-04-16 | Disposition: A | Discharge status: Home / Self Care | Payer: Medicare Other | Source: Ambulatory Visit | Attending: Hematology & Oncology | Admitting: Hematology & Oncology

## 2016-04-16 ENCOUNTER — Other Ambulatory Visit (HOSPITAL_COMMUNITY): Payer: Medicare Other

## 2016-04-16 ENCOUNTER — Encounter (HOSPITAL_COMMUNITY): Payer: Self-pay

## 2016-04-16 ENCOUNTER — Inpatient Hospital Stay (HOSPITAL_COMMUNITY): Payer: Medicare Other

## 2016-04-16 DIAGNOSIS — C3492 Malignant neoplasm of unspecified part of left bronchus or lung: Secondary | ICD-10-CM

## 2016-04-16 DIAGNOSIS — Z801 Family history of malignant neoplasm of trachea, bronchus and lung: Secondary | ICD-10-CM | POA: Insufficient documentation

## 2016-04-16 DIAGNOSIS — Z880 Allergy status to penicillin: Secondary | ICD-10-CM | POA: Diagnosis not present

## 2016-04-16 DIAGNOSIS — Z8249 Family history of ischemic heart disease and other diseases of the circulatory system: Secondary | ICD-10-CM | POA: Insufficient documentation

## 2016-04-16 DIAGNOSIS — Z452 Encounter for adjustment and management of vascular access device: Secondary | ICD-10-CM | POA: Diagnosis not present

## 2016-04-16 DIAGNOSIS — E785 Hyperlipidemia, unspecified: Secondary | ICD-10-CM | POA: Diagnosis not present

## 2016-04-16 DIAGNOSIS — M810 Age-related osteoporosis without current pathological fracture: Secondary | ICD-10-CM | POA: Insufficient documentation

## 2016-04-16 DIAGNOSIS — Z823 Family history of stroke: Secondary | ICD-10-CM | POA: Diagnosis not present

## 2016-04-16 DIAGNOSIS — Z808 Family history of malignant neoplasm of other organs or systems: Secondary | ICD-10-CM | POA: Insufficient documentation

## 2016-04-16 DIAGNOSIS — Z882 Allergy status to sulfonamides status: Secondary | ICD-10-CM | POA: Insufficient documentation

## 2016-04-16 DIAGNOSIS — Z7982 Long term (current) use of aspirin: Secondary | ICD-10-CM | POA: Diagnosis not present

## 2016-04-16 DIAGNOSIS — F17210 Nicotine dependence, cigarettes, uncomplicated: Secondary | ICD-10-CM | POA: Insufficient documentation

## 2016-04-16 DIAGNOSIS — I10 Essential (primary) hypertension: Secondary | ICD-10-CM | POA: Insufficient documentation

## 2016-04-16 DIAGNOSIS — D649 Anemia, unspecified: Secondary | ICD-10-CM

## 2016-04-16 HISTORY — PX: IR GENERIC HISTORICAL: IMG1180011

## 2016-04-16 LAB — BASIC METABOLIC PANEL
Anion gap: 10 (ref 5–15)
BUN: 71 mg/dL — ABNORMAL HIGH (ref 6–20)
CHLORIDE: 100 mmol/L — AB (ref 101–111)
CO2: 23 mmol/L (ref 22–32)
Calcium: 11 mg/dL — ABNORMAL HIGH (ref 8.9–10.3)
Creatinine, Ser: 1.54 mg/dL — ABNORMAL HIGH (ref 0.44–1.00)
GFR calc non Af Amer: 32 mL/min — ABNORMAL LOW (ref >60)
GFR, EST AFRICAN AMERICAN: 37 mL/min — AB (ref >60)
Glucose, Bld: 88 mg/dL (ref 65–99)
Potassium: 5.5 mmol/L — ABNORMAL HIGH (ref 3.5–5.1)
SODIUM: 133 mmol/L — AB (ref 135–145)

## 2016-04-16 LAB — CBC WITH DIFFERENTIAL/PLATELET
Basophils Absolute: 0 10*3/uL (ref 0.0–0.1)
Basophils Relative: 0 %
Eosinophils Absolute: 0.1 10*3/uL (ref 0.0–0.7)
Eosinophils Relative: 1 %
HCT: 33.7 % — ABNORMAL LOW (ref 36.0–46.0)
Hemoglobin: 10.7 g/dL — ABNORMAL LOW (ref 12.0–15.0)
LYMPHS ABS: 1.7 10*3/uL (ref 0.7–4.0)
LYMPHS PCT: 16 %
MCH: 26.4 pg (ref 26.0–34.0)
MCHC: 31.8 g/dL (ref 30.0–36.0)
MCV: 83 fL (ref 78.0–100.0)
Monocytes Absolute: 0.7 10*3/uL (ref 0.1–1.0)
Monocytes Relative: 7 %
NEUTROS ABS: 8.2 10*3/uL — AB (ref 1.7–7.7)
NEUTROS PCT: 76 %
Platelets: 356 10*3/uL (ref 150–400)
RBC: 4.06 MIL/uL (ref 3.87–5.11)
RDW: 16.4 % — ABNORMAL HIGH (ref 11.5–15.5)
WBC: 10.7 10*3/uL — AB (ref 4.0–10.5)

## 2016-04-16 LAB — PROTIME-INR
INR: 1.2
PROTHROMBIN TIME: 15.2 s (ref 11.4–15.2)

## 2016-04-16 MED ORDER — LIDOCAINE HCL 1 % IJ SOLN
INTRAMUSCULAR | Status: AC | PRN
  Administered 2016-04-16: 10 mL

## 2016-04-16 MED ORDER — SODIUM CHLORIDE 0.9 % IV SOLN
INTRAVENOUS | Status: DC
  Administered 2016-04-16: 08:00:00 via INTRAVENOUS

## 2016-04-16 MED ORDER — PROCHLORPERAZINE MALEATE 10 MG PO TABS: 10.0000 mg | ORAL_TABLET | Freq: Four times a day (QID) | ORAL | 2 refills | Status: AC | PRN

## 2016-04-16 MED ORDER — NALOXONE HCL 0.4 MG/ML IJ SOLN
INTRAMUSCULAR | Status: AC
  Filled 2016-04-16: qty 1

## 2016-04-16 MED ORDER — MIDAZOLAM HCL 2 MG/2ML IJ SOLN
INTRAMUSCULAR | Status: AC
  Filled 2016-04-16: qty 4

## 2016-04-16 MED ORDER — MIDAZOLAM HCL 2 MG/2ML IJ SOLN
INTRAMUSCULAR | Status: AC | PRN
  Administered 2016-04-16: 1 mg via INTRAVENOUS
  Administered 2016-04-16: 0.5 mg via INTRAVENOUS

## 2016-04-16 MED ORDER — HEPARIN SOD (PORK) LOCK FLUSH 100 UNIT/ML IV SOLN
INTRAVENOUS | Status: AC
  Filled 2016-04-16: qty 5

## 2016-04-16 MED ORDER — LIDOCAINE HCL 1 % IJ SOLN
INTRAMUSCULAR | Status: AC
  Filled 2016-04-16: qty 20

## 2016-04-16 MED ORDER — ONDANSETRON HCL 8 MG PO TABS: 8.0000 mg | ORAL_TABLET | Freq: Three times a day (TID) | ORAL | 2 refills | Status: DC | PRN

## 2016-04-16 MED ORDER — VANCOMYCIN HCL IN DEXTROSE 1-5 GM/200ML-% IV SOLN
1000.0000 mg | INTRAVENOUS | Status: AC
  Administered 2016-04-16: 1000 mg via INTRAVENOUS
  Filled 2016-04-16: qty 200

## 2016-04-16 MED ORDER — FENTANYL CITRATE (PF) 100 MCG/2ML IJ SOLN
INTRAMUSCULAR | Status: AC | PRN
  Administered 2016-04-16 (×3): 25 ug via INTRAVENOUS

## 2016-04-16 MED ORDER — LIDOCAINE-PRILOCAINE 2.5-2.5 % EX CREA: TOPICAL_CREAM | CUTANEOUS | 2 refills | Status: AC

## 2016-04-16 MED ORDER — LIDOCAINE HCL (PF) 1 % IJ SOLN
INTRAMUSCULAR | Status: AC
  Filled 2016-04-16: qty 30

## 2016-04-16 MED ORDER — LIDOCAINE-EPINEPHRINE (PF) 2 %-1:200000 IJ SOLN
INTRAMUSCULAR | Status: AC
  Filled 2016-04-16: qty 20

## 2016-04-16 MED ORDER — HEPARIN SOD (PORK) LOCK FLUSH 100 UNIT/ML IV SOLN
INTRAVENOUS | Status: AC | PRN
  Administered 2016-04-16: 500 [IU] via INTRAVENOUS

## 2016-04-16 MED ORDER — LIDOCAINE-EPINEPHRINE (PF) 2 %-1:200000 IJ SOLN
INTRAMUSCULAR | Status: AC | PRN
  Administered 2016-04-16: 10 mL

## 2016-04-16 MED ORDER — FLUMAZENIL 0.5 MG/5ML IV SOLN
INTRAVENOUS | Status: DC
Start: 2016-04-16 — End: 2016-04-16
  Filled 2016-04-16: qty 5

## 2016-04-16 MED ORDER — FENTANYL CITRATE (PF) 100 MCG/2ML IJ SOLN
INTRAMUSCULAR | Status: AC
  Filled 2016-04-16: qty 2

## 2016-04-16 NOTE — Discharge Instructions (Signed)
Implanted Port Home Guide °An implanted port is a type of central line that is placed under the skin. Central lines are used to provide IV access when treatment or nutrition needs to be given through a person's veins. Implanted ports are used for long-term IV access. An implanted port may be placed because:  °· You need IV medicine that would be irritating to the small veins in your hands or arms.   °· You need long-term IV medicines, such as antibiotics.   °· You need IV nutrition for a long period.   °· You need frequent blood draws for lab tests.   °· You need dialysis.   °Implanted ports are usually placed in the chest area, but they can also be placed in the upper arm, the abdomen, or the leg. An implanted port has two main parts:  °· Reservoir. The reservoir is round and will appear as a small, raised area under your skin. The reservoir is the part where a needle is inserted to give medicines or draw blood.   °· Catheter. The catheter is a thin, flexible tube that extends from the reservoir. The catheter is placed into a large vein. Medicine that is inserted into the reservoir goes into the catheter and then into the vein.   °HOW WILL I CARE FOR MY INCISION SITE? °Do not get the incision site wet. Bathe or shower as directed by your health care provider.  °HOW IS MY PORT ACCESSED? °Special steps must be taken to access the port:  °· Before the port is accessed, a numbing cream can be placed on the skin. This helps numb the skin over the port site.   °· Your health care provider uses a sterile technique to access the port. °¨ Your health care provider must put on a mask and sterile gloves. °¨ The skin over your port is cleaned carefully with an antiseptic and allowed to dry. °¨ The port is gently pinched between sterile gloves, and a needle is inserted into the port. °· Only "non-coring" port needles should be used to access the port. Once the port is accessed, a blood return should be checked. This helps  ensure that the port is in the vein and is not clogged.   °· If your port needs to remain accessed for a constant infusion, a clear (transparent) bandage will be placed over the needle site. The bandage and needle will need to be changed every week, or as directed by your health care provider.   °· Keep the bandage covering the needle clean and dry. Do not get it wet. Follow your health care provider's instructions on how to take a shower or bath while the port is accessed.   °· If your port does not need to stay accessed, no bandage is needed over the port.   °WHAT IS FLUSHING? °Flushing helps keep the port from getting clogged. Follow your health care provider's instructions on how and when to flush the port. Ports are usually flushed with saline solution or a medicine called heparin. The need for flushing will depend on how the port is used.  °· If the port is used for intermittent medicines or blood draws, the port will need to be flushed:   °¨ After medicines have been given.   °¨ After blood has been drawn.   °¨ As part of routine maintenance.   °· If a constant infusion is running, the port may not need to be flushed.   °HOW LONG WILL MY PORT STAY IMPLANTED? °The port can stay in for as long as your health care   provider thinks it is needed. When it is time for the port to come out, surgery will be done to remove it. The procedure is similar to the one performed when the port was put in.  WHEN SHOULD I SEEK IMMEDIATE MEDICAL CARE? When you have an implanted port, you should seek immediate medical care if:   You notice a bad smell coming from the incision site.   You have swelling, redness, or drainage at the incision site.   You have more swelling or pain at the port site or the surrounding area.   You have a fever that is not controlled with medicine. This information is not intended to replace advice given to you by your health care provider. Make sure you discuss any questions you have with  your health care provider. Document Released: 03/31/2005 Document Revised: 01/19/2013 Document Reviewed: 12/06/2012 Elsevier Interactive Patient Education  2017 [employer], Care After Refer to this sheet in the next few weeks. These instructions provide you with information on caring for yourself after your procedure. Your health care provider may also give you more specific instructions. Your treatment has been planned according to current medical practices, but problems sometimes occur. Call your health care provider if you have any problems or questions after your procedure. WHAT TO EXPECT AFTER THE PROCEDURE After your procedure, it is typical to have the following:   Discomfort at the port insertion site. Ice packs to the area will help.  Bruising on the skin over the port. This will subside in 3-4 days. HOME CARE INSTRUCTIONS  After your port is placed, you will get a manufacturer's information card. The card has information about your port. Keep this card with you at all times.   Know what kind of port you have. There are many types of ports available.   Wear a medical alert bracelet in case of an emergency. This can help alert health care workers that you have a port.   The port can stay in for as long as your health care provider believes it is necessary.   A home health care nurse may give medicines and take care of the port.   You or a family member can get special training and directions for giving medicine and taking care of the port at home.  SEEK MEDICAL CARE IF:   Your port does not flush or you are unable to get a blood return.   You have a fever or chills. SEEK IMMEDIATE MEDICAL CARE IF:  You have new fluid or pus coming from your incision.   You notice a bad smell coming from your incision site.   You have swelling, pain, or more redness at the incision or port site.   You have chest pain or shortness of breath. This  information is not intended to replace advice given to you by your health care provider. Make sure you discuss any questions you have with your health care provider. Document Released: 01/19/2013 Document Revised: 04/05/2013 Document Reviewed: 01/19/2013 Elsevier Interactive Patient Education  2017 [place]. Moderate Conscious Sedation, Adult, Care After These instructions provide you with information about caring for yourself after your procedure. Your health care provider may also give you more specific instructions. Your treatment has been planned according to current medical practices, but problems sometimes occur. Call your health care provider if you have any problems or questions after your procedure. What can I expect after the procedure? After your procedure, it is common:  To feel sleepy for several hours.  To feel clumsy and have poor balance for several hours.  To have poor judgment for several hours.  To vomit if you eat too soon. Follow these instructions at home: For at least 24 hours after the procedure:   Do not:  Participate in activities where you could fall or become injured.  Drive.  Use heavy machinery.  Drink alcohol.  Take sleeping pills or medicines that cause drowsiness.  Make important decisions or sign legal documents.  Take care of children on your own.  Rest. Eating and drinking  Follow the diet recommended by your health care provider.  If you vomit:  Drink water, juice, or soup when you can drink without vomiting.  Make sure you have little or no nausea before eating solid foods. General instructions  Have a responsible adult stay with you until you are awake and alert.  Take over-the-counter and prescription medicines only as told by your health care provider.  If you smoke, do not smoke without supervision.  Keep all follow-up visits as told by your health care provider. This is important. Contact a health care provider  if:  You keep feeling nauseous or you keep vomiting.  You feel light-headed.  You develop a rash.  You have a fever. Get help right away if:  You have trouble breathing. This information is not intended to replace advice given to you by your health care provider. Make sure you discuss any questions you have with your health care provider. Document Released: 01/19/2013 Document Revised: 09/03/2015 Document Reviewed: 07/21/2015 Elsevier Interactive Patient Education  2017 [employer].

## 2016-04-16 NOTE — Consult Note (Signed)
Chief Complaint: Patient was seen in consultation today for Port-A-Cath placement  Referring Physician(s): Penland,Shannon K  Supervising Physician: Markus Daft  Patient Status: [facility] - Out-pt  History of Present Illness: Michelle Mendoza is a 76 y.o. 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 Past Medical History:  Diagnosis Date  . Cancer ([place])    bladder  . Elevated transaminase level   . H/O renal artery stenosis   . Hyperlipidemia   . Hypertension   . Osteoporosis   . Squamous cell carcinoma lung, left ([place]) 03/31/2016    Past Surgical History:  Procedure Laterality Date  . AORTIC VALVULOPLASTY  1995  . CAROTID ENDARTERECTOMY Right   . COLONOSCOPY    . FLEXIBLE BRONCHOSCOPY Bilateral 04/02/2016   Procedure: FLEXIBLE BRONCHOSCOPY;  Surgeon: Sinda Du, MD;  Location: [facility] SUITE;  Service: Thoracic;  Laterality: Bilateral;  . [place]   "blockages" - ?stenting  . TUBAL LIGATION      Allergies: Penicillins and Sulfa antibiotics  Medications: Prior to Admission medications   Medication Sig Start Date End Date Taking? Authorizing Provider  acetaminophen (TYLENOL) 500 MG tablet Take 1,000 mg by mouth every 6 (six) hours as needed for moderate pain.   Yes Historical Provider, MD  aspirin 81 MG tablet Take 81 mg by mouth daily.   Yes Historical Provider, MD  atenolol (TENORMIN) 50 MG tablet TAKE (1) TABLET BY MOUTH TWICE DAILY. 01/01/16  Yes Kathyrn Drown, MD  calcium-vitamin D (OSCAL WITH D) 500-200 MG-UNIT per tablet Take 1 tablet by mouth 2 (two) times daily.   Yes Historical Provider, MD  co-enzyme Q-10 30 MG capsule Take 100 mg by mouth daily.   Yes Historical Provider, MD  Cyanocobalamin (B-12) 250 MCG TABS Take 1 tablet by mouth daily.    Yes Historical Provider, MD  lisinopril-hydrochlorothiazide (PRINZIDE,ZESTORETIC) 20-12.5 MG tablet  Take 1 tablet by mouth every morning. 01/01/16  Yes Kathyrn Drown, MD  megestrol (MEGACE) 400 MG/10ML suspension Place 10 mLs (400 mg total) into feeding tube 2 (two) times daily. 04/11/16  Yes Patrici Ranks, MD  multivitamin-lutein (OCUVITE-LUTEIN) CAPS capsule Take 1 capsule by mouth daily.   Yes Historical Provider, MD  nicotine (NICODERM CQ - DOSED IN MG/24 HOURS) 21 mg/24hr patch Place 21 mg onto the skin daily. 04/15/16  Yes Historical Provider, MD  pravastatin (PRAVACHOL) 20 MG tablet Take 1 tablet (20 mg total) by mouth daily. 01/01/16  Yes Kathyrn Drown, MD  zolpidem (AMBIEN) 5 MG tablet Take 1 tablet (5 mg total) by mouth at bedtime as needed for sleep. 07/05/14  Yes Kathyrn Drown, MD  alendronate (FOSAMAX) 70 MG tablet Take 1 tablet (70 mg total) by mouth every 7 (seven) days. Take with a full glass of water on an empty stomach sitting upright. 07/03/15   Kathyrn Drown, MD  Omega-3 Fatty Acids (FISH OIL) 1000 MG CAPS Take 1 capsule by mouth daily.    Historical Provider, MD     Family History  Problem Relation Age of Onset  . Hypertension Sister   . Hypertension Other   . Stroke Other   . Heart attack Other   . Dementia Mother 40    significant ASCVD  . Lung cancer Father 74  . Brain cancer Brother 18    Social History   Social History  . Marital status: Married    Spouse  name: N/A  . Number of children: N/A  . Years of education: N/A   Occupational History  . 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 Smoking status: Current Every Day Smoker    Packs/day: 1.00    Types: Cigarettes  . Smokeless tobacco: Never Used     Comment: currently only 4 cigs/day  . Alcohol use No  . Drug use: No  . Sexual activity: Not Asked     Comment: married   Other Topics Concern  . None   Social History Narrative  . None      Review of Systems Denies fever, headache, chest pain,abdominal pain, nausea, vomiting or abnormal bleeding. She does have dyspnea,  occasional cough, occ back pain, weight loss, hoarseness, weakness, anorexia  Vital Signs: BP 110/68 (BP Location: Left Arm)   Pulse 77   Temp 97.4 F (36.3 C) (Oral)   Resp 18   Ht '5\' 1"'$  (1.549 m)   Wt 97 lb 1 oz (44 kg)   SpO2 100%   BMI 18.34 kg/m   Physical Exam Awake, alert. Chest clear to auscultation bilaterally. Heart with regular rate and rhythm. Abdomen soft, positive bowel sounds,  Lower extremities- no edema.  Mallampati Score:     Imaging: Dg Chest 2 View  Result Date: 03/31/2016 CLINICAL DATA:  Weakness.  Back pain.  Fall . EXAM: CHEST  2 VIEW COMPARISON:  CT 11/21/2008. FINDINGS: A very large left suprahilar infiltrate or mass noted. Pulmonary nodular densities are noted throughout the lungs, with the largest measuring approximately 2 cm and in the right mid lung. Underlying malignancy should be considered in this patient. IV contrast-enhanced chest CT suggested for further evaluation. Pleural-parenchymal thickening consistent with scarring. No pleural effusion or pneumothorax. Heart size normal. Surgical clips in the neck. Carotid vascular calcification . IMPRESSION: Large left suprahilar infiltrate or mass. Bilateral pulmonary nodules particularly prominent in the right mid lung. These findings are suspicious for malignancy. IV contrast-enhanced chest CT suggest for further evaluation . Electronically Signed   By: Marcello Moores  Register   On: 03/31/2016 14:45   Dg Lumbar Spine Complete  Result Date: 03/31/2016 CLINICAL DATA:  Weakness.  Low back pain . EXAM: LUMBAR SPINE - COMPLETE 4+ VIEW COMPARISON:  No prior . FINDINGS: Surgical clips noted over the abdomen. Thoracolumbar spine scoliosis. Diffuse severe degenerative changes lumbar spine . No evidence of fracture or dislocation. Aortoiliac atherosclerotic vascular disease . IMPRESSION: 1. Surgical clips noted over the abdomen. Scoliosis lumbar spine concave right . Diffuse multilevel degenerative change. No acute  abnormality. 2. Aortoiliac atherosclerotic vascular disease . Electronically Signed   By: Marcello Moores  Register   On: 03/31/2016 14:47   Ct Head Wo Contrast  Result Date: 03/31/2016 CLINICAL DATA:  Upper respiratory infection. Fall 1 week ago. Weakness and dizziness. Headache. EXAM: CT HEAD WITHOUT CONTRAST TECHNIQUE: Contiguous axial images were obtained from the base of the skull through the vertex without intravenous contrast. COMPARISON:  None. FINDINGS: Brain: No mass lesion, intraparenchymal hemorrhage or extra-axial collection. No evidence of acute cortical infarct. Focal hypodensity within the right the anterior temporal pole is favored to be a large perivascular space or neuroepithelial cyst. Otherwise, there is periventricular hypoattenuation compatible with chronic microvascular ischemia. Vascular: Atherosclerotic calcification of the vertebral and internal carotid arteries at the skullbase. Skull: Normal visualized skull base, calvarium and extracranial soft tissues. Sinuses/Orbits: No sinus fluid levels or advanced mucosal thickening. No mastoid effusion. Normal orbits. IMPRESSION: 1. No acute  intracranial abnormality. 2. Chronic microvascular ischemia. Electronically Signed   By: Ulyses Jarred M.D.   On: 03/31/2016 14:52   Ct Chest Wo Contrast  Result Date: 03/31/2016 CLINICAL DATA:  76 y/o  F; EXAM: CT CHEST WITHOUT CONTRAST TECHNIQUE: Multidetector CT imaging of the chest was performed following the standard protocol without IV contrast. COMPARISON:  03/31/2016 chest radiograph FINDINGS: Cardiovascular: Aortic atherosclerosis with moderate calcification. The descending thoracic aorta measures up to 36 mm at the hilum (series 5, image 59). Normal caliber main pulmonary artery. Normal heart size. No pericardial effusion. Severe coronary artery calcification. Aortic valvular calcification. Mediastinum/Nodes: Mediastinal lymphadenopathy predominantly at the sub- carinal, right and left lower  paratracheal, and AP window stations for example conglomerate precarinal lymph nodes measure approximately 21 x 31 mm (series 2 image 61). Lungs/Pleura: Left suprahilar masslike opacity measures approximately 68 x 37 x 64 mm (AP x ML x CC) series 2, image 49 series 5, image 50. The bronchi extending to the apical posterior probably anterior segments are occluded with atelectasis of the anterior segment. Throughout the apical posterior segment. 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 There are numerous pulmonary nodules throughout the lungs bilaterally the largest on the right subjacent pleura and upper lobe measuring 12 x 18 mm (series 3 image 86. Upper Abdomen: No acute abnormality. Musculoskeletal: No chest wall mass or suspicious bone lesions identified. IMPRESSION: 1. Left suprahilar mass measuring up to 6.8 cm. There is occlusion of left upper lobe segmental bronchi and distinguishing mass from atelectasis is suboptimal without intravenous contrast. This probably represents primary lung cancer, possible metastatic disease. 2. Numerous pulmonary nodules in the lungs bilaterally are likely metastatic. 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 3. Lower mediastinal lymphadenopathy is likely metastatic. 4. Aortic valvular calcification associated with aortic stenosis. Severe coronary artery calcification. 5. 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 Recommend annual imaging followup by CTA or MRA. This recommendation follows 2010 ACCF/AHA/AATS/ACR/ASA/SCA/SCAI/SIR/STS/SVM Guidelines for the Diagnosis and Management of Patients with Thoracic Aortic Disease. Circulation.2010; 121: Q469-G295 Electronically Signed   By: Kristine Garbe M.D.   On: 03/31/2016 16:28   Ct  Abdomen Pelvis W Contrast  Result Date: 04/02/2016 CLINICAL DATA:  Left upper lobe mass, presumed lung cancer on recent chest CT. Bilateral pulmonary nodules. No abdominal complaints. EXAM: CT ABDOMEN AND PELVIS WITH CONTRAST TECHNIQUE: Multidetector CT imaging of the abdomen and pelvis was performed using the standard protocol following bolus administration of intravenous contrast. CONTRAST:  84m ISOVUE-300 IOPAMIDOL (ISOVUE-300) INJECTION 61% COMPARISON:  Chest CT 03/31/2016.  Abdominal CT 11/21/2008. FINDINGS: Lower chest: Small nodules again noted at both lung bases, likely metastases. No significant pleural or pericardial effusion. There is aortic atherosclerosis. Hepatobiliary: The liver is normal in density without focal abnormality. No evidence of gallstones, gallbladder wall thickening or biliary dilatation. Pancreas: Unremarkable. No pancreatic ductal dilatation or surrounding inflammatory changes. Spleen: Normal in size without focal abnormality. Adrenals/Urinary Tract: The adrenal glands appear stable without suspicious findings. There are fairly extensive renovascular calcifications bilaterally. No definite urinary tract calculus, hydronephrosis or delay in contrast excretion. There are small bilateral renal cysts. 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 The bladder appears unremarkable. Stomach/Bowel: No evidence of bowel wall thickening, distention or surrounding inflammatory change. The appendix appears normal. Vascular/Lymphatic: There are no enlarged  abdominal or pelvic lymph nodes. Extensive aortic and branch vessel atherosclerosis status post aorto bi-iliac grafting. There are probable renal artery grafts bilaterally. No evidence of acute vascular occlusion. Reproductive: The uterus and ovaries appear normal. Other: There are several small midline abdominal hernias containing only fat. No herniated bowel. No ascites. Musculoskeletal: No  acute or significant osseous findings. Convex left scoliosis with associated multilevel spondylosis. IMPRESSION: 1. No evidence of metastatic disease within the abdomen or pelvis. 2. 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 3. Bilateral renal cysts. Electronically Signed   By: Richardean Sale M.D.   On: 04/02/2016 09:55    Labs:  CBC:  Recent Labs  03/31/16 1201 04/01/16 0416 04/11/16 1643 04/16/16 0802  WBC 10.8* 10.5 11.5* 10.7*  HGB 8.8* 7.4* 11.3* 10.7*  HCT 28.7* 24.3* 36.0 33.7*  PLT 451* 398 371 356    COAGS:  Recent Labs  04/01/16 0416 04/16/16 0802  INR 1.20 1.20    BMP:  Recent Labs  12/27/15 0903 03/31/16 1201 04/01/16 0416 04/11/16 1643  NA 140 134* 134* 129*  K 4.8 5.1 4.8 4.9  CL 99 98* 105 94*  CO2 '22 25 22 25  '$ GLUCOSE 99 168* 93 95  BUN 40* 54* 36* 57*  CALCIUM 9.5 11.1* 9.6 11.0*  CREATININE 1.04* 1.76* 1.11* 1.58*  GFRNONAA 53* 28* 48* 31*  GFRAA 62 32* 56* 36*    LIVER FUNCTION TESTS:  Recent Labs  06/28/15 0846 12/27/15 0903 04/11/16 1643  BILITOT 0.6 0.4 0.5  AST 55* 42* 53*  ALT '6 6 14  '$ ALKPHOS 53 46 55  PROT 7.1 7.2 7.7  ALBUMIN 4.1 3.8 3.1*    TUMOR MARKERS: No results for input(s): AFPTM, CEA, CA199, CHROMGRNA in the last 8760 hours.  Assessment and Plan:  76 y.o. 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     Thank you for this interesting consult.  I greatly enjoyed meeting AMIYLAH ANASTOS and look forward to participating in their care.  A copy of this report was sent to the requesting  provider on this date.  Electronically Signed: D. Rowe Robert 04/16/2016, 8:37 AM   I spent a total of  25 minutes   in face to face in clinical consultation, greater than 50% of which was counseling/coordinating care for port a cath placement

## 2016-04-16 NOTE — Progress Notes (Signed)
START ON PATHWAY REGIMEN - Non-Small Cell Lung  XIV129: Carboplatin AUC=5 D1 + Gemcitabine 1,000 mg/m2 D1, 8 q21 Days x 4 Cycles   A cycle is every 21 days:     Carboplatin (Paraplatin(R)) AUC=5 in 250 mL NS IV over 1 hour day 1 only Dose Mod: None     Gemcitabine (Gemzar(R)) 1000 mg/m2 in 250 mL NS IV over 30 minutes days 1 and 8 Dose Mod: None Additional Orders: *All AUC calculations intended to be used in [employer] formula  **Always confirm dose/schedule in your pharmacy ordering system**    Patient Characteristics: Stage IV Metastatic, Squamous, PS = 0, 1, First Line, PD-L1 Expression Positive 1-49% (TPS) / Negative / Not Tested / Not a Candidate for Immunotherapy Check here if patient was staged using an edition prior to AJCC Staging - 8th Edition (i.e., prior to April 14, 2016)? false AJCC T Category: TX Current Disease Status: Distant Metastases AJCC N Category: NX AJCC M Category: M1 AJCC 8 Stage Grouping: IVB Histology: Squamous Cell Line of therapy: First Line PD-L1 Expression Status: Quantity Not Sufficient Performance Status: PS = 0, 1 Would you be surprised if this patient died  in the next year? I would NOT be surprised if this patient died in the next year  Intent of Therapy: Non-Curative / Palliative Intent, Discussed with Patient

## 2016-04-16 NOTE — Procedures (Signed)
Successful placement of right jugular port.  Tip at SVC/RA junction.  Minimal blood loss and no immediate complication.   

## 2016-04-17 ENCOUNTER — Other Ambulatory Visit (HOSPITAL_COMMUNITY)
Admission: RE | Admit: 2016-04-17 | Discharge: 2016-04-17 | Disposition: A | Discharge status: Home / Self Care | Payer: Medicare Other | Source: Ambulatory Visit | Attending: Internal Medicine | Admitting: Internal Medicine

## 2016-04-17 ENCOUNTER — Encounter (HOSPITAL_COMMUNITY): Payer: Self-pay | Admitting: Emergency Medicine

## 2016-04-17 ENCOUNTER — Encounter (HOSPITAL_COMMUNITY): Payer: Self-pay

## 2016-04-17 ENCOUNTER — Encounter (HOSPITAL_COMMUNITY): Payer: Medicare Other | Attending: Hematology & Oncology

## 2016-04-17 ENCOUNTER — Telehealth (HOSPITAL_COMMUNITY): Payer: Self-pay | Admitting: Emergency Medicine

## 2016-04-17 ENCOUNTER — Encounter: Payer: Self-pay | Admitting: *Deleted

## 2016-04-17 DIAGNOSIS — C3412 Malignant neoplasm of upper lobe, left bronchus or lung: Secondary | ICD-10-CM

## 2016-04-17 DIAGNOSIS — C3492 Malignant neoplasm of unspecified part of left bronchus or lung: Secondary | ICD-10-CM | POA: Insufficient documentation

## 2016-04-17 DIAGNOSIS — D649 Anemia, unspecified: Secondary | ICD-10-CM | POA: Insufficient documentation

## 2016-04-17 MED ORDER — SODIUM CHLORIDE 0.9 % IV SOLN
Freq: Once | INTRAVENOUS | Status: AC
  Administered 2016-04-17: 12:00:00 via INTRAVENOUS

## 2016-04-17 NOTE — Patient Instructions (Signed)
[facility] Discharge Instructions  RECOMMENDATIONS MADE BY THE CONSULTANT AND ANY TEST RESULTS WILL BE SENT TO YOUR REFERRING PHYSICIAN.  IV fluids today. Return as scheduled for chemotherapy.   Thank you for choosing [facility] to provide your oncology and hematology care.  To afford each patient quality time with our provider, please arrive at least 15 minutes before your scheduled appointment time.    If you have a lab appointment with the [facility] please come in thru the  Main Entrance and check in at the main information desk  You need to re-schedule your appointment should you arrive 10 or more minutes late.  We strive to give you quality time with our providers, and arriving late affects you and other patients whose appointments are after yours.  Also, if you no show three or more times for appointments you may be dismissed from the clinic at the providers discretion.     Again, thank you for choosing [facility].  Our hope is that these requests will decrease the amount of time that you wait before being seen by our physicians.       _____________________________________________________________  Should you have questions after your visit to [facility], please contact our office at [phone] between the hours of 8:30 a.m. and 4:30 p.m.  Voicemails left after 4:30 p.m. will not be returned until the following business day.  For prescription refill requests, have your pharmacy contact our office.       Resources For Cancer Patients and their Caregivers ? American Cancer Society: Can assist with transportation, wigs, general needs, runs Look Good Feel Better.        260-317-2110 ? Cancer Care: Provides financial assistance, online support groups, medication/co-pay assistance.  1-800-813-HOPE (518) 463-9125) ? [facility] Assists [place] cancer  patients and their families through emotional , educational and financial support.  276-411-3107 ? [place] DSS Where to apply for food stamps, Medicaid and utility assistance. 615-226-3470 ? RCATS: Transportation to medical appointments. 437-445-9455 ? Social Security Administration: May apply for disability if have a Stage IV cancer. (870)474-5858 586-115-1697 ? [employer], Disability and Transit Services: Assists with nutrition, care and transit needs. [place] Support Programs: '@10RELATIVEDAYS'$ @ > Cancer Support Group  2nd Tuesday of the month 1pm-2pm, Journey Room  > Creative Journey  3rd Tuesday of the month 1130am-1pm, Journey Room  > Look Good Feel Better  1st Wednesday of the month 10am-12 noon, Journey Room (Call [place] to register [phone])

## 2016-04-17 NOTE — Progress Notes (Signed)
Oncology Nurse Navigator Documentation  Oncology Nurse Navigator Flowsheets 04/17/2016  Navigator Encounter Type Other/per cancer conference discussion, Dr. Julien Nordmann suggested tissue to be sent for PDL 1 testing.  I notified [facility] to check with Dr. Whitney Muse before sending.    Barriers/Navigation Needs Coordination of Care  Interventions Coordination of Care  Coordination of Care Other  Acuity Level 2  Acuity Level 2 Other  Time Spent with Patient 15

## 2016-04-17 NOTE — Progress Notes (Signed)
Tolerated infusion w/o adverse reaction.  Alert, in no distress.  VSS.  Discharged via wheelchair in c/o family.  

## 2016-04-17 NOTE — Progress Notes (Signed)
Chemotherapy teaching pulled together and appts.

## 2016-04-17 NOTE — Telephone Encounter (Signed)
Called [facility] to verify they received the specimen.  They did on 04/16/2016.

## 2016-04-21 ENCOUNTER — Encounter (HOSPITAL_COMMUNITY): Payer: Self-pay

## 2016-04-21 ENCOUNTER — Ambulatory Visit
Admission: RE | Admit: 2016-04-21 | Discharge: 2016-04-21 | Disposition: A | Discharge status: Home / Self Care | Payer: Medicare Other | Source: Ambulatory Visit | Attending: Hematology & Oncology | Admitting: Hematology & Oncology

## 2016-04-21 ENCOUNTER — Encounter (HOSPITAL_BASED_OUTPATIENT_CLINIC_OR_DEPARTMENT_OTHER): Payer: Medicare Other

## 2016-04-21 VITALS — BP 91/42 | HR 73 | Temp 98.0°F | Resp 18 | Wt 94.0 lb

## 2016-04-21 DIAGNOSIS — C3412 Malignant neoplasm of upper lobe, left bronchus or lung: Secondary | ICD-10-CM | POA: Diagnosis present

## 2016-04-21 DIAGNOSIS — N281 Cyst of kidney, acquired: Secondary | ICD-10-CM | POA: Diagnosis not present

## 2016-04-21 DIAGNOSIS — C3492 Malignant neoplasm of unspecified part of left bronchus or lung: Secondary | ICD-10-CM | POA: Diagnosis not present

## 2016-04-21 DIAGNOSIS — J9811 Atelectasis: Secondary | ICD-10-CM | POA: Diagnosis not present

## 2016-04-21 DIAGNOSIS — I700 Atherosclerosis of aorta: Secondary | ICD-10-CM | POA: Diagnosis not present

## 2016-04-21 DIAGNOSIS — D649 Anemia, unspecified: Secondary | ICD-10-CM | POA: Diagnosis not present

## 2016-04-21 DIAGNOSIS — Z95828 Presence of other vascular implants and grafts: Secondary | ICD-10-CM

## 2016-04-21 DIAGNOSIS — I2510 Atherosclerotic heart disease of native coronary artery without angina pectoris: Secondary | ICD-10-CM | POA: Diagnosis not present

## 2016-04-21 LAB — CBC WITH DIFFERENTIAL/PLATELET
Basophils Absolute: 0 10*3/uL (ref 0.0–0.1)
Basophils Relative: 0 %
Eosinophils Absolute: 0.1 10*3/uL (ref 0.0–0.7)
Eosinophils Relative: 1 %
HEMATOCRIT: 30.1 % — AB (ref 36.0–46.0)
HEMOGLOBIN: 9.7 g/dL — AB (ref 12.0–15.0)
LYMPHS ABS: 1.4 10*3/uL (ref 0.7–4.0)
LYMPHS PCT: 13 %
MCH: 27.2 pg (ref 26.0–34.0)
MCHC: 32.2 g/dL (ref 30.0–36.0)
MCV: 84.3 fL (ref 78.0–100.0)
MONO ABS: 0.7 10*3/uL (ref 0.1–1.0)
MONOS PCT: 7 %
NEUTROS ABS: 8.7 10*3/uL — AB (ref 1.7–7.7)
NEUTROS PCT: 79 %
Platelets: 335 10*3/uL (ref 150–400)
RBC: 3.57 MIL/uL — ABNORMAL LOW (ref 3.87–5.11)
RDW: 16.5 % — AB (ref 11.5–15.5)
WBC: 10.9 10*3/uL — ABNORMAL HIGH (ref 4.0–10.5)

## 2016-04-21 LAB — COMPREHENSIVE METABOLIC PANEL
ALK PHOS: 45 U/L (ref 38–126)
ALT: 15 U/L (ref 14–54)
ANION GAP: 9 (ref 5–15)
AST: 52 U/L — ABNORMAL HIGH (ref 15–41)
Albumin: 2.8 g/dL — ABNORMAL LOW (ref 3.5–5.0)
BILIRUBIN TOTAL: 0.8 mg/dL (ref 0.3–1.2)
BUN: 54 mg/dL — ABNORMAL HIGH (ref 6–20)
CALCIUM: 10.4 mg/dL — AB (ref 8.9–10.3)
CO2: 22 mmol/L (ref 22–32)
Chloride: 102 mmol/L (ref 101–111)
Creatinine, Ser: 1.5 mg/dL — ABNORMAL HIGH (ref 0.44–1.00)
GFR, EST AFRICAN AMERICAN: 39 mL/min — AB (ref >60)
GFR, EST NON AFRICAN AMERICAN: 33 mL/min — AB (ref >60)
GLUCOSE: 148 mg/dL — AB (ref 65–99)
Potassium: 4.7 mmol/L (ref 3.5–5.1)
Sodium: 133 mmol/L — ABNORMAL LOW (ref 135–145)
TOTAL PROTEIN: 7.3 g/dL (ref 6.5–8.1)

## 2016-04-21 LAB — GLUCOSE, CAPILLARY: Glucose-Capillary: 88 mg/dL (ref 65–99)

## 2016-04-21 MED ORDER — HEPARIN SOD (PORK) LOCK FLUSH 100 UNIT/ML IV SOLN
500.0000 [IU] | Freq: Once | INTRAVENOUS | Status: AC
  Administered 2016-04-21: 500 [IU] via INTRAVENOUS

## 2016-04-21 MED ORDER — FLUDEOXYGLUCOSE F - 18 (FDG) INJECTION
11.9700 | Freq: Once | INTRAVENOUS | Status: AC | PRN
  Administered 2016-04-21: 11.97 via INTRAVENOUS

## 2016-04-21 MED ORDER — SODIUM CHLORIDE 0.9 % IV SOLN
INTRAVENOUS | Status: DC
  Administered 2016-04-21: 14:00:00 via INTRAVENOUS

## 2016-04-21 NOTE — Progress Notes (Signed)
Tolerated infusion w/o adverse reaction.  Alert, in no distress.  VSS.  Discharged via wheelchair in c/o family.  

## 2016-04-21 NOTE — Progress Notes (Signed)
Patient's PA done on EMLA cream - approved with Cover My Meds - case reference # [id number]

## 2016-04-22 ENCOUNTER — Encounter (HOSPITAL_COMMUNITY): Payer: Self-pay

## 2016-04-23 ENCOUNTER — Telehealth (HOSPITAL_COMMUNITY): Payer: Self-pay | Admitting: Emergency Medicine

## 2016-04-23 ENCOUNTER — Encounter (HOSPITAL_COMMUNITY): Payer: Medicare Other

## 2016-04-23 DIAGNOSIS — C3492 Malignant neoplasm of unspecified part of left bronchus or lung: Secondary | ICD-10-CM

## 2016-04-23 NOTE — Telephone Encounter (Signed)
Called daughter Jeannene Patella to give her results of PET scan but Dr Whitney Muse would go over them further in detail when she came in for the office visit.

## 2016-04-23 NOTE — Progress Notes (Signed)
Consent signed for carboplatin and gemzar

## 2016-04-24 ENCOUNTER — Encounter (HOSPITAL_COMMUNITY): Payer: Self-pay | Admitting: Hematology & Oncology

## 2016-04-24 ENCOUNTER — Encounter: Payer: Self-pay | Admitting: Dietician

## 2016-04-24 ENCOUNTER — Encounter (HOSPITAL_BASED_OUTPATIENT_CLINIC_OR_DEPARTMENT_OTHER): Payer: Medicare Other | Admitting: Hematology & Oncology

## 2016-04-24 ENCOUNTER — Encounter (HOSPITAL_BASED_OUTPATIENT_CLINIC_OR_DEPARTMENT_OTHER): Payer: Medicare Other

## 2016-04-24 ENCOUNTER — Other Ambulatory Visit (HOSPITAL_COMMUNITY): Payer: Self-pay | Admitting: Emergency Medicine

## 2016-04-24 VITALS — BP 83/49 | HR 78 | Temp 98.1°F | Resp 18 | Wt 93.6 lb

## 2016-04-24 VITALS — BP 106/48 | HR 74 | Temp 98.8°F | Resp 18

## 2016-04-24 DIAGNOSIS — R490 Dysphonia: Secondary | ICD-10-CM | POA: Diagnosis not present

## 2016-04-24 DIAGNOSIS — C3412 Malignant neoplasm of upper lobe, left bronchus or lung: Secondary | ICD-10-CM | POA: Diagnosis not present

## 2016-04-24 DIAGNOSIS — D649 Anemia, unspecified: Secondary | ICD-10-CM | POA: Diagnosis not present

## 2016-04-24 DIAGNOSIS — C3492 Malignant neoplasm of unspecified part of left bronchus or lung: Secondary | ICD-10-CM | POA: Diagnosis not present

## 2016-04-24 DIAGNOSIS — E43 Unspecified severe protein-calorie malnutrition: Secondary | ICD-10-CM | POA: Diagnosis not present

## 2016-04-24 DIAGNOSIS — R634 Abnormal weight loss: Secondary | ICD-10-CM

## 2016-04-24 DIAGNOSIS — Z95828 Presence of other vascular implants and grafts: Secondary | ICD-10-CM

## 2016-04-24 DIAGNOSIS — Z5111 Encounter for antineoplastic chemotherapy: Secondary | ICD-10-CM | POA: Diagnosis present

## 2016-04-24 DIAGNOSIS — Z7189 Other specified counseling: Secondary | ICD-10-CM

## 2016-04-24 LAB — CBC WITH DIFFERENTIAL/PLATELET
Basophils Absolute: 0 10*3/uL (ref 0.0–0.1)
Basophils Relative: 0 %
Eosinophils Absolute: 0.1 10*3/uL (ref 0.0–0.7)
Eosinophils Relative: 1 %
HEMATOCRIT: 28.6 % — AB (ref 36.0–46.0)
HEMOGLOBIN: 9.5 g/dL — AB (ref 12.0–15.0)
LYMPHS ABS: 1 10*3/uL (ref 0.7–4.0)
Lymphocytes Relative: 9 %
MCH: 27.7 pg (ref 26.0–34.0)
MCHC: 33.2 g/dL (ref 30.0–36.0)
MCV: 83.4 fL (ref 78.0–100.0)
MONO ABS: 0.5 10*3/uL (ref 0.1–1.0)
MONOS PCT: 5 %
NEUTROS PCT: 85 %
Neutro Abs: 9.4 10*3/uL — ABNORMAL HIGH (ref 1.7–7.7)
Platelets: 362 10*3/uL (ref 150–400)
RBC: 3.43 MIL/uL — ABNORMAL LOW (ref 3.87–5.11)
RDW: 16.6 % — AB (ref 11.5–15.5)
WBC: 11 10*3/uL — ABNORMAL HIGH (ref 4.0–10.5)

## 2016-04-24 LAB — COMPREHENSIVE METABOLIC PANEL
ALK PHOS: 45 U/L (ref 38–126)
ALT: 16 U/L (ref 14–54)
ANION GAP: 10 (ref 5–15)
AST: 59 U/L — ABNORMAL HIGH (ref 15–41)
Albumin: 2.7 g/dL — ABNORMAL LOW (ref 3.5–5.0)
BILIRUBIN TOTAL: 0.6 mg/dL (ref 0.3–1.2)
BUN: 42 mg/dL — ABNORMAL HIGH (ref 6–20)
CALCIUM: 9.9 mg/dL (ref 8.9–10.3)
CO2: 21 mmol/L — ABNORMAL LOW (ref 22–32)
Chloride: 101 mmol/L (ref 101–111)
Creatinine, Ser: 1.35 mg/dL — ABNORMAL HIGH (ref 0.44–1.00)
GFR, EST AFRICAN AMERICAN: 44 mL/min — AB (ref >60)
GFR, EST NON AFRICAN AMERICAN: 38 mL/min — AB (ref >60)
GLUCOSE: 197 mg/dL — AB (ref 65–99)
POTASSIUM: 4.4 mmol/L (ref 3.5–5.1)
Sodium: 132 mmol/L — ABNORMAL LOW (ref 135–145)
TOTAL PROTEIN: 7.6 g/dL (ref 6.5–8.1)

## 2016-04-24 MED ORDER — SODIUM CHLORIDE 0.9 % IV SOLN
241.0000 mg | Freq: Once | INTRAVENOUS | Status: AC
  Administered 2016-04-24: 240 mg via INTRAVENOUS
  Filled 2016-04-24: qty 24

## 2016-04-24 MED ORDER — SODIUM CHLORIDE 0.9 % IV SOLN
750.0000 mg/m2 | Freq: Once | INTRAVENOUS | Status: AC
  Administered 2016-04-24: 1026 mg via INTRAVENOUS
  Filled 2016-04-24: qty 21.74

## 2016-04-24 MED ORDER — PALONOSETRON HCL INJECTION 0.25 MG/5ML
0.2500 mg | Freq: Once | INTRAVENOUS | Status: AC
  Administered 2016-04-24: 0.25 mg via INTRAVENOUS
  Filled 2016-04-24: qty 5

## 2016-04-24 MED ORDER — SODIUM CHLORIDE 0.9 % IV SOLN
Freq: Once | INTRAVENOUS | Status: AC
  Administered 2016-04-24: 11:00:00 via INTRAVENOUS

## 2016-04-24 MED ORDER — HEPARIN SOD (PORK) LOCK FLUSH 100 UNIT/ML IV SOLN
INTRAVENOUS | Status: AC
  Filled 2016-04-24: qty 5

## 2016-04-24 MED ORDER — SODIUM CHLORIDE 0.9 % IV SOLN: 10.0000 mg | Freq: Once | INTRAVENOUS | Status: DC

## 2016-04-24 MED ORDER — HEPARIN SOD (PORK) LOCK FLUSH 100 UNIT/ML IV SOLN
500.0000 [IU] | Freq: Once | INTRAVENOUS | Status: AC | PRN
  Administered 2016-04-24: 500 [IU]
  Filled 2016-04-24: qty 5

## 2016-04-24 MED ORDER — DEXAMETHASONE SODIUM PHOSPHATE 10 MG/ML IJ SOLN
10.0000 mg | Freq: Once | INTRAMUSCULAR | Status: AC
  Administered 2016-04-24: 10 mg via INTRAVENOUS
  Filled 2016-04-24: qty 1

## 2016-04-24 MED ORDER — SODIUM CHLORIDE 0.9% FLUSH
10.0000 mL | INTRAVENOUS | Status: DC | PRN
  Administered 2016-04-24: 10 mL
  Filled 2016-04-24: qty 10

## 2016-04-24 NOTE — Patient Instructions (Signed)
[facility] Discharge Instructions for Patients Receiving Chemotherapy   Beginning January 23rd 2017 lab work for the Cancer Center will be done in the  Main lab at [facility] on 1st floor. If you have a lab appointment with the Cancer Center please come in thru the  Main Entrance and check in at the main information desk   Today you received the following chemotherapy agents Gemzar and Carboplatin. Follow-up as scheduled. Call clinic for any questions or concerns  To help prevent nausea and vomiting after your treatment, we encourage you to take your nausea medication   If you develop nausea and vomiting, or diarrhea that is not controlled by your medication, call the clinic.  The clinic phone number is [phone]. Office hours are Monday-Friday 8:30am-5:00pm.  BELOW ARE SYMPTOMS THAT SHOULD BE REPORTED IMMEDIATELY:  *FEVER GREATER THAN 101.0 F  *CHILLS WITH OR WITHOUT FEVER  NAUSEA AND VOMITING THAT IS NOT CONTROLLED WITH YOUR NAUSEA MEDICATION  *UNUSUAL SHORTNESS OF BREATH  *UNUSUAL BRUISING OR BLEEDING  TENDERNESS IN MOUTH AND THROAT WITH OR WITHOUT PRESENCE OF ULCERS  *URINARY PROBLEMS  *BOWEL PROBLEMS  UNUSUAL RASH Items with * indicate a potential emergency and should be followed up as soon as possible. If you have an emergency after office hours please contact your primary care physician or go to the nearest emergency department.  Please call the clinic during office hours if you have any questions or concerns.   You may also contact the Patient Navigator at [phone] should you have any questions or need assistance in obtaining follow up care.      Resources For Cancer Patients and their Caregivers ? American Cancer Society: Can assist with transportation, wigs, general needs, runs Look Good Feel Better.        1-888-227-6333 ? Cancer Care: Provides financial assistance, online support groups, medication/co-pay assistance.   1-800-813-HOPE (4673) ? [facility] Assists [place] cancer patients and their families through emotional , educational and financial support.  336-427-4357 ? [place] DSS Where to apply for food stamps, Medicaid and utility assistance. 336-342-1394 ? RCATS: Transportation to medical appointments. 336-347-2287 ? Social Security Administration: May apply for disability if have a Stage IV cancer. 336-342-7796 1-800-772-1213 ? [place] Aging, Disability and Transit Services: Assists with nutrition, care and transit needs. 336-349-2343         

## 2016-04-24 NOTE — Progress Notes (Signed)
Michelle Mendoza tolerated chemo tx well without complaints or incident.Permit was signed during chemo educational class per pt's daughter. VSS upon discharge. Pt discharged self ambulatory in satisfactory condition accompanied by her daughter

## 2016-04-24 NOTE — Progress Notes (Signed)
Michelle Curet, MD [place] [address]     Squamous cell carcinoma lung, left ([place])   03/31/2016 Initial Diagnosis    Squamous cell carcinoma lung, left ([place])     04/21/2016 PET scan    1. Primary 7.7 by 5.0 cm left upper lobe mass occludes the left upper lobe bronchus and has a maximum SUV of 22.8. Scattered bilateral pulmonary nodules are hypermetabolic indicating metastatic disease. Considerable mediastinal and hilar pathologic adenopathy. Faintly activity in the adrenal glands could represent early metastatic disease to the adrenals. 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 2. There is a small hypermetabolic focus along the right posterior glottic level in the vicinity of the right arytenoid cartilage without CT correlate, maximum SUV 6.3. Given the lack of CT correlate this may simply be physiologic but is focally prominent and may merit attention on follow up studies.      04/21/2016 Pathology Results    Guardant360- Alterations detected: TP53. SMO, MYC, RB1, BRCA1, GATA3, and MET.  ALK/EGFR NEGATIVE.      04/24/2016 -  Chemotherapy    The patient had palonosetron (ALOXI) injection 0.25 mg, 0.25 mg, Intravenous,  Once, 1 of 4 cycles  CARBOplatin (PARAPLATIN) 240 mg in sodium chloride 0.9 % 250 mL chemo infusion, 240 mg (100 % of original dose 241 mg), Intravenous,  Once, 1 of 4 cycles Dose modification:   (original dose 241 mg, Cycle 1)  gemcitabine (GEMZAR) 1,026 mg in sodium chloride 0.9 % 250 mL chemo infusion, 750 mg/m2 = 1,026 mg (100 % of original dose 750 mg/m2), Intravenous,  Once, 1 of 4 cycles Dose modification: 750 mg/m2 (original dose 750 mg/m2, Cycle 1, Reason: Provider Judgment)  for chemotherapy treatment.          MERCIA Mendoza FXT:[id number] DOB: 1941-03-29 DOA: (Not on file) PCP: Sallee Lange, MD  Squamous cell carcinoma lung, left ([place]) 03/31/2016 Initial Diagnosis Squamous cell carcinoma lung, left  ([place])  04/21/2016 PET scan 1. Primary 7.7 by 5.0 cm left upper lobe mass occludes the left upper lobe bronchus and has a maximum SUV of 22.8. Scattered bilateral pulmonary nodules are hypermetabolic indicating metastatic disease. Considerable mediastinal and hilar pathologic adenopathy. Faintly activity in the adrenal glands could represent early metastatic disease to the adrenals. 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 2. There is a small hypermetabolic focus along the right posterior glottic level in the vicinity of the right arytenoid cartilage without CT correlate, maximum SUV 6.3. Given the lack of CT correlate this may simply be physiologic but is focally prominent and may merit attention on follow up studies.    HPI:   Michelle Mendoza is in a wheelchair today accompanied by her daughter for cycle 1 carboplatin/gemcitabine.  She was concerned about losing her hair and if the cancer she has will ever go away.  I personally reviewed and went over scans and labs with the patient.  Daughter states that this week her appetite has been better and she has been drinking water. Last night she ate tacos "just as much as the rest of [place]". This morning she ate an egg biscuit and orange juice. She states she doesn't like the taste of Megace.  Daughter was concerned with how successful the treatment is.   She states she has peace and has a supportive family, church, and friends.  She states she is ready for treatment.  She states she has no other questions or  concerns at this time.  MEDICAL HISTORY:      Past Medical History:  Diagnosis Date  . Cancer ([place])    bladder  . Elevated transaminase level   . H/O renal artery stenosis   . Hyperlipidemia   . Hypertension   . Osteoporosis     SURGICAL HISTORY:      Past Surgical History:  Procedure Laterality Date  . AORTIC VALVULOPLASTY  1995  . CAROTID ENDARTERECTOMY Right   . COLONOSCOPY    .  FLEXIBLE BRONCHOSCOPY Bilateral 04/02/2016   Procedure: FLEXIBLE BRONCHOSCOPY;  Surgeon: Sinda Du, MD;  Location: [facility];  Service: Thoracic;  Laterality: Bilateral;  . [place]   "blockages" - ?stenting  . TUBAL LIGATION      SOCIAL HISTORY: Social History        Social History  . Marital status: Married    Spouse name: N/A  . Number of children: N/A  . Years of education: N/A       Occupational History  . 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 Smoking status: Current Every Day Smoker    Packs/day: 1.00    Types: Cigarettes  . Smokeless tobacco: Never Used     Comment: currently only 4 cigs/day  . Alcohol use No  . Drug use: No  . Sexual activity: Not on file     Comment: married       Other Topics Concern  . Not on file      Social History Narrative  . No narrative on file   Married for 51 years.  2 children, 1 son and 1 daughter. Her daughter is a Equities trader. 2 granddaughters 5 step great grandchildren Ex smoker, 1/2 ppd. ETOH, never Her hobby was working. 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 She was born [place]:       Family History  Problem Relation Age of Onset  . Hypertension Sister   . Hypertension Other   . Stroke Other   . Heart attack Other   . Dementia Mother 82    significant ASCVD  . Lung cancer Father 9  . Brain cancer Brother 36   Mother deceased 76 years-old. She had 6 brothers. Father deceased 66 years-old of lung cancer. He was a heavy smoker. He had 4 brothers and 1 sister. Oldest brother died of a brain tumor at 37 yo Two sisters. The younger sister has hypertension. The middle sister has heart issues.  ALLERGIES:  is allergic to penicillins and sulfa antibiotics.  MEDICATIONS:        Current Outpatient Prescriptions  Medication Sig Dispense Refill  . acetaminophen (TYLENOL) 500 MG tablet  Take 1,000 mg by mouth every 6 (six) hours as needed for moderate pain.    Marland Kitchen alendronate (FOSAMAX) 70 MG tablet Take 1 tablet (70 mg total) by mouth every 7 (seven) days. Take with a full glass of water on an empty stomach sitting upright. 12 tablet 3  . aspirin 81 MG tablet Take 81 mg by mouth daily.    Marland Kitchen atenolol (TENORMIN) 50 MG tablet TAKE (1) TABLET BY MOUTH TWICE DAILY. 180 tablet 3  . calcium-vitamin D (OSCAL WITH D) 500-200 MG-UNIT per tablet Take 1 tablet by mouth 2 (two) times daily.    Marland Kitchen co-enzyme Q-10 30 MG capsule Take 100 mg by mouth daily.    . Cyanocobalamin (  B-12) 250 MCG TABS Take 1 tablet by mouth daily.     Marland Kitchen lisinopril-hydrochlorothiazide (PRINZIDE,ZESTORETIC) 20-12.5 MG tablet Take 1 tablet by mouth every morning. 90 tablet 1  . multivitamin-lutein (OCUVITE-LUTEIN) CAPS capsule Take 1 capsule by mouth daily.    . Omega-3 Fatty Acids (FISH OIL) 1000 MG CAPS Take 1 capsule by mouth daily.    . pravastatin (PRAVACHOL) 20 MG tablet Take 1 tablet (20 mg total) by mouth daily. 90 tablet 1  . zolpidem (AMBIEN) 5 MG tablet Take 1 tablet (5 mg total) by mouth at bedtime as needed for sleep. 90 tablet 1   No current facility-administered medications for this visit.     Review of Systems  Constitutional: Negative.        Appetite has been getting better and she has been drinking water.  HENT: Negative.   Eyes: Negative.   Respiratory: Negative.   Cardiovascular: Negative.   Gastrointestinal: Negative.   Genitourinary: Negative.   Musculoskeletal: Negative.   Skin: Negative.   Neurological: Negative.   Endo/Heme/Allergies: Negative.   Psychiatric/Behavioral: Negative.   All other systems reviewed and are negative.  Objective: Vitals:   04/24/16 1013  BP: (!) 83/49  Pulse: 78  Resp: 18  Temp: 98.1 F (36.7 C)  TempSrc: Oral  SpO2: 100%  Weight: 93 lb 9.6 oz (42.5 kg)   No intake or output data in the 24 hours ending 04/24/16 1040   Filed Weights    04/24/16 1013  Weight: 93 lb 9.6 oz (42.5 kg)    Physical Exam  Constitutional: She is oriented to person, place, and time and well-developed, well-nourished, and in no distress.  Pt is in a wheelchair. Wears glasses  HENT:  Head: Normocephalic and atraumatic.  Eyes: Conjunctivae and EOM are normal. Pupils are equal, round, and reactive to light.  Neck: Normal range of motion. Neck supple.  Cardiovascular: Normal rate, regular rhythm and normal heart sounds.   Pulmonary/Chest: Effort normal and breath sounds normal.  Good breath sounds in left upper lung  Abdominal: Soft. Bowel sounds are normal.  Musculoskeletal: Normal range of motion.  Neurological: She is alert and oriented to person, place, and time. Gait normal.  Skin: Skin is warm and dry.  Nursing note and vitals reviewed.    PATHOLOGY:     .RADIOLOGY: I have reviewed the images below and agree with the reported results  Study Result   CLINICAL DATA:  Initial treatment strategy for left upper lobe lung mass, non-small cell lung cancer on biopsy 04/02/2016.  EXAM: NUCLEAR MEDICINE PET SKULL BASE TO THIGH  TECHNIQUE: 12.0 mCi F-18 FDG was injected intravenously. Full-ring PET imaging was performed from the skull base to thigh after the radiotracer. CT data was obtained and used for attenuation correction and anatomic localization.  FASTING BLOOD GLUCOSE:  Value: 88 mg/dl  COMPARISON:  Chest CT from 03/31/2016  FINDINGS: NECK  Small hypermetabolic focus along the right posterior glottic level in the vicinity of the right arytenoid cartilage, no CT correlate, maximum SUV 6.3  Dense carotid atherosclerotic calcification.  CHEST  Approximately 7.7 by 5.0 cm dominant left upper lobe anterior mass with central necrosis and marginal hypermetabolic activity, maximum SUV 22.8. This occludes the left upper lobe bronchus and there is atelectasis of the rest of the left upper lobe.  Considerable  confluent and adjacent suprahilar and AP window adenopathy if, difficult to individually measure given the confluent and indistinctly marginated nature, but with a maximum SUV of approximately 13.0. If  a long subcarinal node measures 1.8 cm in short axis and has a maximum standard uptake value of 13.6. Hypermetabolic right hilar activity has a short axis diameter of 1.5 cm and a maximum SUV of 8.5. A 1.7 cm in short axis right axillary lymph node has a maximum standard uptake value of 4.2.  Scattered bilateral pulmonary nodules are identified, most under 1 cm in size, these lesions appear to be hypermetabolic. An index subpleural nodule anteriorly in the right upper lobe measures 1.8 by 1.2 cm on image 94/ 3 and has a maximum standard uptake value of 16.9.  Coronary, aortic arch, and branch vessel atherosclerotic vascular disease.  ABDOMEN/PELVIS  Hypermetabolic activity in the left adrenal gland with only slight nodular thickening, maximum SUV 5.0 faintly accentuated right adrenal activity without CT correlate, maximum SUV 3.9. Background hepatic SUV approximately 2.8.  Presumed physiologic activity in the cecum and ascending colon without CT correlate.  Several focal areas of ureteral activity ascribed to excreted FDG.  Suspected renal artery grafts bilaterally. Aortoiliac atherosclerotic vascular disease. Left kidney upper pole and lower pole complex cysts. 0.7 by 0.9 cm lesion in the right mid kidney laterally, probably a Bosniak category 2 cyst, no obvious accentuated activity in this vicinity although small renal lesions are difficult to characterize by PET-CT due to the high degree of underlying a renal background activity from FDG excretion.  SKELETON  No focal hypermetabolic activity to suggest skeletal metastasis.  IMPRESSION: 1. Primary 7.7 by 5.0 cm left upper lobe mass occludes the left upper lobe bronchus and has a maximum SUV of 22.8.  Scattered bilateral pulmonary nodules are hypermetabolic indicating metastatic disease. Considerable mediastinal and hilar pathologic adenopathy. Faintly activity in the adrenal glands could represent early metastatic disease to the adrenals. 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 2. There is a small hypermetabolic focus along the right posterior glottic level in the vicinity of the right arytenoid cartilage without CT correlate, maximum SUV 6.3. Given the lack of CT correlate this may simply be physiologic but is focally prominent and may merit attention on follow up studies. 3. Other imaging findings of potential clinical significance: Coronary, aortic arch, and branch vessel atherosclerotic vascular disease. Aortoiliac atherosclerotic vascular disease. Renal artery grafts. Bilateral renal cysts.   Electronically Signed   By: Van Clines M.D.   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 She has stage IV disease PS is marginal. I am concerned about her ability to tolerate therapy. She is very determined. She is prone to dehydration, has limited oral intake and I discussed her PS and associated liiklihood of increased toxicitiy with chemotherapy  If hypercalcemia persists she may simply benefit from [facility], may be from her underlying malignancy. Although if she stays hydrated not a problem.   No tumor for PDL1 testing. Guardant is pending but she is rapidly declining and wishes to begin therapy. Carbo/gemzar planned for today.   She may need palliatve XRT to the upper lobe mass.   She will return for follow up next week Nutrition consultation, megace given to stimulate appetite.   All questions were answered. The patient knows to  call the clinic with any problems, questions or concerns.  This document serves as a record of services personally performed by Ancil Linsey, MD. It was  created on her behalf by Arlyce Harman, a trained medical scribe. The creation of this record is based on the scribe's personal observations and the provider's statements to them. This document has been checked and approved by the attending provider.  I have reviewed the above documentation for accuracy and completeness and I agree with the above.  This note was electronically signed.   Molli Hazard, MD  04/11/2016 4:02 PM

## 2016-04-24 NOTE — Progress Notes (Signed)
RD consulted by RN due to patient having a very poor appetite/PO intake  Contacted Pt by visiting during infusion   Wt Readings from Last 10 Encounters:  04/24/16 93 lb 9.6 oz (42.5 kg)  04/21/16 94 lb (42.6 kg)  04/17/16 97 lb (44 kg)  04/16/16 97 lb 1 oz (44 kg)  04/11/16 95 lb 6.4 oz (43.3 kg)  03/31/16 90 lb (40.8 kg)  01/01/16 107 lb (48.5 kg)  07/03/15 113 lb 3.2 oz (51.3 kg)  01/02/15 120 lb 4 oz (54.5 kg)  07/05/14 121 lb (54.9 kg)   Patient weight is somewhat stable. Daughter stated the pt was wearing a very heavy coat when she weighed at 97 lbs and believes her weight of 90 lbs was not accurate/estimated.   Patient reports oral intake as improving. Daughter says she was started on Megace 1-2 weeks and she "has seen a difference" in the patients PO intake.   At this time, pt is only suffering from a poor appetite. She has no n/v/c/d, but she has not started on chemotherapy yet; today is her first infusion.   Went through dietary recall. Currently, she is eating 2 "good meals" a day and drinking 2 chocolate Boost supplements/day.   Her meal items vary and she had trouble recalling some items she has recently eaten. Daughter said she had a taco salad yesterday for dinner and waffles w/ light syrup w/ decaf tea.   RD explained that because pt's appetite is poorer, she needs to prioritize calorie/protein dense foods.  RD went over the "Big Three" of cheese, Peanut butter and eggs which are some of the most common foods that are high in both kcals and protein. She is eating ~4x a day now, recommended increasing 5-6. Educated she should eat every few hours, especially before bed and shortly after waking up, to minimize the amount of time she is fasting.   She eats reduced calorie food items and largely drinks calorie free beverages w/ artificial sweeteners. This is a long established habit because her husband was a diabetic. RD asked to try to switch to the regular products to increase  caloric intake. Ideally, she would consume protein containing beverages. The only milk she like is choc milk, asked her to consume this. Alternatively, she could add slight amounts of protein powder to her Sundrop and tea.   RD went over the Ensure assistance program. Pt reportedly only likes Boost and not Ensure, though daughter asked her to reconsider. There are no samples of Ensure Plus at this time. They will purchase this retail to try and let RD know if they would like to order one of these. Advised that if pt does get nauseated, these beverages may not help and she would do better with a clear beverage.   Finally, RD explained concept of never eating a food by itself. She should add toppings, condiments, dressings, creams, syrups etc to her meals. Examples given are adding whipped cream, chocolate syrup fruit to pancakes and adding cheese, heaving dressings, nuts, to her salads.   Pt seemed to be lethargic. Daughter stated pt didn't sleep well due to nervousness.   Pt says overall she feels good.   Encouraged her to reach out if she has nutrition issues.   RD left coupons and handouts titled "Increasing Calories and Protein"  Burtis Junes RD, LDN, [place] [facility] Pager: [phone] 04/24/2016 12:01 PM

## 2016-04-24 NOTE — Patient Instructions (Addendum)
[place] at [facility] Discharge Instructions  RECOMMENDATIONS MADE BY THE CONSULTANT AND ANY TEST RESULTS WILL BE SENT TO YOUR REFERRING PHYSICIAN.  You were seen today by Dr. Whitney Muse Follow up next week with Dr. Whitney Muse to see how you are doing w/chemo You will have lab work and chemo that day as well.   Thank you for choosing [place] at [facility] to provide your oncology and hematology care.  To afford each patient quality time with our provider, please arrive at least 15 minutes before your scheduled appointment time.    If you have a lab appointment with the [facility] please come in thru the  Main Entrance and check in at the main information desk  You need to re-schedule your appointment should you arrive 10 or more minutes late.  We strive to give you quality time with our providers, and arriving late affects you and other patients whose appointments are after yours.  Also, if you no show three or more times for appointments you may be dismissed from the clinic at the providers discretion.     Again, thank you for choosing [facility].  Our hope is that these requests will decrease the amount of time that you wait before being seen by our physicians.       _____________________________________________________________  Should you have questions after your visit to [facility], please contact our office at [phone] between the hours of 8:30 a.m. and 4:30 p.m.  Voicemails left after 4:30 p.m. will not be returned until the following business day.  For prescription refill requests, have your pharmacy contact our office.       Resources For Cancer Patients and their Caregivers ? American Cancer Society: Can assist with transportation, wigs, general needs, runs Look Good Feel Better.        (949)066-0749 ? Cancer Care: Provides financial assistance, online support groups, medication/co-pay  assistance.  1-800-813-HOPE 3800503680) ? [place] Assists [place] cancer patients and their families through emotional , educational and financial support.  605-359-2222 ? [place] DSS Where to apply for food stamps, Medicaid and utility assistance. (970) 764-5727 ? RCATS: Transportation to medical appointments. 702 146 3045 ? Social Security Administration: May apply for disability if have a Stage IV cancer. 619 296 0139 914-704-1487 ? [employer], Disability and Transit Services: Assists with nutrition, care and transit needs. [place] Support Programs: '@10RELATIVEDAYS'$ @ > Cancer Support Group  2nd Tuesday of the month 1pm-2pm, Journey Room  > Creative Journey  3rd Tuesday of the month 1130am-1pm, Journey Room  > Look Good Feel Better  1st Wednesday of the month 10am-12 noon, Journey Room (Call [place] to register [phone])

## 2016-04-25 ENCOUNTER — Telehealth (HOSPITAL_COMMUNITY): Payer: Self-pay

## 2016-04-25 NOTE — Telephone Encounter (Signed)
See telephone encounter note.

## 2016-04-28 ENCOUNTER — Other Ambulatory Visit (HOSPITAL_COMMUNITY): Payer: Self-pay | Admitting: *Deleted

## 2016-04-28 ENCOUNTER — Telehealth (HOSPITAL_COMMUNITY): Payer: Self-pay | Admitting: *Deleted

## 2016-04-28 ENCOUNTER — Other Ambulatory Visit (HOSPITAL_COMMUNITY): Payer: Self-pay | Admitting: Oncology

## 2016-04-28 DIAGNOSIS — C3492 Malignant neoplasm of unspecified part of left bronchus or lung: Secondary | ICD-10-CM

## 2016-04-28 MED ORDER — TRAMADOL HCL 50 MG PO TABS: 50.0000 mg | ORAL_TABLET | Freq: Four times a day (QID) | ORAL | 0 refills | Status: AC | PRN

## 2016-04-29 ENCOUNTER — Encounter (HOSPITAL_BASED_OUTPATIENT_CLINIC_OR_DEPARTMENT_OTHER): Payer: Medicare Other

## 2016-04-29 VITALS — BP 86/40 | HR 50 | Temp 97.5°F | Resp 16

## 2016-04-29 DIAGNOSIS — Z95828 Presence of other vascular implants and grafts: Secondary | ICD-10-CM

## 2016-04-29 DIAGNOSIS — C3412 Malignant neoplasm of upper lobe, left bronchus or lung: Secondary | ICD-10-CM

## 2016-04-29 DIAGNOSIS — C3492 Malignant neoplasm of unspecified part of left bronchus or lung: Secondary | ICD-10-CM

## 2016-04-29 MED ORDER — SODIUM CHLORIDE 0.9 % IV SOLN
INTRAVENOUS | Status: DC
  Administered 2016-04-29: 12:00:00 via INTRAVENOUS

## 2016-04-29 MED ORDER — HEPARIN SOD (PORK) LOCK FLUSH 100 UNIT/ML IV SOLN
INTRAVENOUS | Status: AC
  Filled 2016-04-29: qty 5

## 2016-04-29 MED ORDER — HEPARIN SOD (PORK) LOCK FLUSH 100 UNIT/ML IV SOLN
500.0000 [IU] | Freq: Once | INTRAVENOUS | Status: AC
  Administered 2016-04-29: 500 [IU] via INTRAVENOUS
  Filled 2016-04-29 (×2): qty 5

## 2016-04-29 MED ORDER — EPOETIN ALFA 10000 UNIT/ML IJ SOLN
INTRAMUSCULAR | Status: AC
  Filled 2016-04-29: qty 1

## 2016-04-29 MED ORDER — SODIUM CHLORIDE 0.9% FLUSH
10.0000 mL | INTRAVENOUS | Status: DC | PRN
  Administered 2016-04-29: 10 mL via INTRAVENOUS
  Filled 2016-04-29: qty 10

## 2016-04-29 NOTE — Patient Instructions (Signed)
[facility] at [facility] Discharge Instructions  RECOMMENDATIONS MADE BY THE CONSULTANT AND ANY TEST RESULTS WILL BE SENT TO YOUR REFERRING PHYSICIAN.  Received hydration today. Follow-up as scheduled. Call clinic for any questions or concerns  Thank you for choosing [facility] at [facility] to provide your oncology and hematology care.  To afford each patient quality time with our provider, please arrive at least 15 minutes before your scheduled appointment time.    If you have a lab appointment with the Cancer Center please come in thru the  Main Entrance and check in at the main information desk  You need to re-schedule your appointment should you arrive 10 or more minutes late.  We strive to give you quality time with our providers, and arriving late affects you and other patients whose appointments are after yours.  Also, if you no show three or more times for appointments you may be dismissed from the clinic at the providers discretion.     Again, thank you for choosing [facility].  Our hope is that these requests will decrease the amount of time that you wait before being seen by our physicians.       _____________________________________________________________  Should you have questions after your visit to [facility], please contact our office at [phone] between the hours of 8:30 a.m. and 4:30 p.m.  Voicemails left after 4:30 p.m. will not be returned until the following business day.  For prescription refill requests, have your pharmacy contact our office.       Resources For Cancer Patients and their Caregivers ? American Cancer Society: Can assist with transportation, wigs, general needs, runs Look Good Feel Better.        1-888-227-6333 ? Cancer Care: Provides financial assistance, online support groups, medication/co-pay assistance.  1-800-813-HOPE (4673) ? [facility] Assists [place] cancer patients and their families through emotional , educational and financial support.  336-427-4357 ? [place] DSS Where to apply for food stamps, Medicaid and utility assistance. 336-342-1394 ? RCATS: Transportation to medical appointments. 336-347-2287 ? Social Security Administration: May apply for disability if have a Stage IV cancer. 336-342-7796 1-800-772-1213 ? [place] Aging, Disability and Transit Services: Assists with nutrition, care and transit needs. 336-349-2343  Cancer Center Support Programs: @10RELATIVEDAYS@ > Cancer Support Group  2nd Tuesday of the month 1pm-2pm, Journey Room  > Creative Journey  3rd Tuesday of the month 1130am-1pm, Journey Room  > Look Good Feel Better  1st Wednesday of the month 10am-12 noon, Journey Room (Call American Cancer Society to register 1-800-395-5775)   

## 2016-04-29 NOTE — Progress Notes (Signed)
Patient tolerated IVF's well. Patient was more alert and feeling better after receiving fluids. Patient left via wheelchair in stable condition accompanied by family.

## 2016-05-01 ENCOUNTER — Encounter (HOSPITAL_BASED_OUTPATIENT_CLINIC_OR_DEPARTMENT_OTHER): Payer: Medicare Other

## 2016-05-01 ENCOUNTER — Encounter (HOSPITAL_COMMUNITY): Payer: Self-pay | Admitting: Oncology

## 2016-05-01 ENCOUNTER — Encounter (HOSPITAL_BASED_OUTPATIENT_CLINIC_OR_DEPARTMENT_OTHER): Payer: Medicare Other | Admitting: Oncology

## 2016-05-01 VITALS — BP 99/34 | HR 72 | Temp 98.2°F | Resp 16 | Wt 91.9 lb

## 2016-05-01 DIAGNOSIS — C3492 Malignant neoplasm of unspecified part of left bronchus or lung: Secondary | ICD-10-CM | POA: Diagnosis not present

## 2016-05-01 DIAGNOSIS — D649 Anemia, unspecified: Secondary | ICD-10-CM | POA: Diagnosis not present

## 2016-05-01 DIAGNOSIS — C3412 Malignant neoplasm of upper lobe, left bronchus or lung: Secondary | ICD-10-CM

## 2016-05-01 DIAGNOSIS — I959 Hypotension, unspecified: Secondary | ICD-10-CM | POA: Diagnosis not present

## 2016-05-01 DIAGNOSIS — Z5111 Encounter for antineoplastic chemotherapy: Secondary | ICD-10-CM | POA: Diagnosis present

## 2016-05-01 DIAGNOSIS — Z7189 Other specified counseling: Secondary | ICD-10-CM | POA: Insufficient documentation

## 2016-05-01 HISTORY — DX: Other specified counseling: Z71.89

## 2016-05-01 LAB — CBC WITH DIFFERENTIAL/PLATELET
BASOS ABS: 0 10*3/uL (ref 0.0–0.1)
Basophils Relative: 0 %
Eosinophils Absolute: 0 10*3/uL (ref 0.0–0.7)
Eosinophils Relative: 0 %
HEMATOCRIT: 27.1 % — AB (ref 36.0–46.0)
HEMOGLOBIN: 8.3 g/dL — AB (ref 12.0–15.0)
LYMPHS PCT: 14 %
Lymphs Abs: 0.8 10*3/uL (ref 0.7–4.0)
MCH: 25.9 pg — AB (ref 26.0–34.0)
MCHC: 30.6 g/dL (ref 30.0–36.0)
MCV: 84.7 fL (ref 78.0–100.0)
Monocytes Absolute: 0.3 10*3/uL (ref 0.1–1.0)
Monocytes Relative: 5 %
NEUTROS ABS: 4.7 10*3/uL (ref 1.7–7.7)
Neutrophils Relative %: 81 %
PLATELETS: 144 10*3/uL — AB (ref 150–400)
RBC: 3.2 MIL/uL — AB (ref 3.87–5.11)
RDW: 15.9 % — ABNORMAL HIGH (ref 11.5–15.5)
WBC: 5.8 10*3/uL (ref 4.0–10.5)

## 2016-05-01 LAB — COMPREHENSIVE METABOLIC PANEL WITH GFR
ALT: 16 U/L (ref 14–54)
AST: 58 U/L — ABNORMAL HIGH (ref 15–41)
Albumin: 2.7 g/dL — ABNORMAL LOW (ref 3.5–5.0)
Alkaline Phosphatase: 56 U/L (ref 38–126)
Anion gap: 11 (ref 5–15)
BUN: 49 mg/dL — ABNORMAL HIGH (ref 6–20)
CO2: 20 mmol/L — ABNORMAL LOW (ref 22–32)
Calcium: 9.9 mg/dL (ref 8.9–10.3)
Chloride: 102 mmol/L (ref 101–111)
Creatinine, Ser: 1.21 mg/dL — ABNORMAL HIGH (ref 0.44–1.00)
GFR calc Af Amer: 50 mL/min — ABNORMAL LOW
GFR calc non Af Amer: 43 mL/min — ABNORMAL LOW
Glucose, Bld: 124 mg/dL — ABNORMAL HIGH (ref 65–99)
Potassium: 4.8 mmol/L (ref 3.5–5.1)
Sodium: 133 mmol/L — ABNORMAL LOW (ref 135–145)
Total Bilirubin: 0.3 mg/dL (ref 0.3–1.2)
Total Protein: 7.5 g/dL (ref 6.5–8.1)

## 2016-05-01 LAB — PREPARE RBC (CROSSMATCH)

## 2016-05-01 MED ORDER — SODIUM CHLORIDE 0.9 % IV SOLN
750.0000 mg/m2 | Freq: Once | INTRAVENOUS | Status: AC
  Administered 2016-05-01: 1026 mg via INTRAVENOUS
  Filled 2016-05-01: qty 5.24

## 2016-05-01 MED ORDER — PROCHLORPERAZINE MALEATE 10 MG PO TABS
10.0000 mg | ORAL_TABLET | Freq: Once | ORAL | Status: DC
  Filled 2016-05-01: qty 1

## 2016-05-01 MED ORDER — SODIUM CHLORIDE 0.9 % IV SOLN
Freq: Once | INTRAVENOUS | Status: AC
  Administered 2016-05-01: 12:00:00 via INTRAVENOUS

## 2016-05-01 MED ORDER — HEPARIN SOD (PORK) LOCK FLUSH 100 UNIT/ML IV SOLN
500.0000 [IU] | Freq: Once | INTRAVENOUS | Status: AC | PRN
  Administered 2016-05-01: 500 [IU]
  Filled 2016-05-01: qty 5

## 2016-05-01 MED ORDER — SODIUM CHLORIDE 0.9% FLUSH
10.0000 mL | INTRAVENOUS | Status: DC | PRN
  Administered 2016-05-01: 10 mL
  Filled 2016-05-01: qty 10

## 2016-05-01 NOTE — Patient Instructions (Signed)
[facility] Discharge Instructions for Patients Receiving Chemotherapy   Beginning January 23rd 2017 lab work for the Cancer Center will be done in the  Main lab at [place] on 1st floor. If you have a lab appointment with the Cancer Center please come in thru the  Main Entrance and check in at the main information desk   Today you received the following chemotherapy agents   To help prevent nausea and vomiting after your treatment, we encourage you to take your nausea medication     If you develop nausea and vomiting, or diarrhea that is not controlled by your medication, call the clinic.  The clinic phone number is [phone]. Office hours are Monday-Friday 8:30am-5:00pm.  BELOW ARE SYMPTOMS THAT SHOULD BE REPORTED IMMEDIATELY:  *FEVER GREATER THAN 101.0 F  *CHILLS WITH OR WITHOUT FEVER  NAUSEA AND VOMITING THAT IS NOT CONTROLLED WITH YOUR NAUSEA MEDICATION  *UNUSUAL SHORTNESS OF BREATH  *UNUSUAL BRUISING OR BLEEDING  TENDERNESS IN MOUTH AND THROAT WITH OR WITHOUT PRESENCE OF ULCERS  *URINARY PROBLEMS  *BOWEL PROBLEMS  UNUSUAL RASH Items with * indicate a potential emergency and should be followed up as soon as possible. If you have an emergency after office hours please contact your primary care physician or go to the nearest emergency department.  Please call the clinic during office hours if you have any questions or concerns.   You may also contact the Patient Navigator at [phone] should you have any questions or need assistance in obtaining follow up care.      Resources For Cancer Patients and their Caregivers ? American Cancer Society: Can assist with transportation, wigs, general needs, runs Look Good Feel Better.        1-888-227-6333 ? Cancer Care: Provides financial assistance, online support groups, medication/co-pay assistance.  1-800-813-HOPE (4673) ? [facility] Assists [place] cancer  patients and their families through emotional , educational and financial support.  336-427-4357 ? [place] DSS Where to apply for food stamps, Medicaid and utility assistance. 336-342-1394 ? [facility]: Transportation to medical appointments. 336-347-2287 ? Social Security Administration: May apply for disability if have a Stage IV cancer. 336-342-7796 1-800-772-1213 ? [place] [facility], Disability and Transit Services: Assists with nutrition, care and transit needs. 336-349-2343         

## 2016-05-01 NOTE — Patient Instructions (Signed)
[place] [facility] Discharge Instructions  RECOMMENDATIONS MADE BY THE CONSULTANT AND ANY TEST RESULTS WILL BE SENT TO YOUR REFERRING PHYSICIAN.  You were seen today by Gershon Mussel. Come back tomorrow for blood transfusion Follow up as previously scheduled  Thank you for choosing [place] at [facility] to provide your oncology and hematology care.  To afford each patient quality time with our provider, please arrive at least 15 minutes before your scheduled appointment time.    If you have a lab appointment with the [facility] please come in thru the  Main Entrance and check in at the main information desk  You need to re-schedule your appointment should you arrive 10 or more minutes late.  We strive to give you quality time with our providers, and arriving late affects you and other patients whose appointments are after yours.  Also, if you no show three or more times for appointments you may be dismissed from the clinic at the providers discretion.     Again, thank you for choosing [facility].  Our hope is that these requests will decrease the amount of time that you wait before being seen by our physicians.       _____________________________________________________________  Should you have questions after your visit to [facility], please contact our office at [phone] between the hours of 8:30 a.m. and 4:30 p.m.  Voicemails left after 4:30 p.m. will not be returned until the following business day.  For prescription refill requests, have your pharmacy contact our office.       Resources For Cancer Patients and their Caregivers ? American Cancer Society: Can assist with transportation, wigs, general needs, runs Look Good Feel Better.        (917)480-9202 ? Cancer Care: Provides financial assistance, online support groups, medication/co-pay assistance.  1-800-813-HOPE (845) 230-0217) ? [facility] Assists [place] cancer patients and their families through emotional , educational and financial support.  715 264 6652 ? [place] DSS Where to apply for food stamps, Medicaid and utility assistance. (863)595-3048 ? [facility]: Transportation to medical appointments. 857 195 5981 ? Social Security Administration: May apply for disability if have a Stage IV cancer. 850 481 0729 405-563-3781 ? [employer], Disability and Transit Services: Assists with nutrition, care and transit needs. [place] Support Programs: '@10RELATIVEDAYS'$ @ > Cancer Support Group  2nd Tuesday of the month 1pm-2pm, Journey Room  > Creative Journey  3rd Tuesday of the month 1130am-1pm, Journey Room  > Look Good Feel Better  1st Wednesday of the month 10am-12 noon, Journey Room (Call [place] to register [phone])

## 2016-05-01 NOTE — Progress Notes (Signed)
Sallee Lange, MD [address] [place] [address]  Squamous cell carcinoma lung, left [facility])  Goals of care, counseling/discussion  CURRENT THERAPY: Carboplatin/Gemcitabine day 1, 8, every 21 days beginning on 04/24/2016.  INTERVAL HISTORY: Michelle Mendoza 76 y.o. 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    Squamous cell carcinoma lung, left ([place])   03/31/2016 Initial Diagnosis    Squamous cell carcinoma lung, left ([place])     04/21/2016 PET scan    1. Primary 7.7 by 5.0 cm left upper lobe mass occludes the left upper lobe bronchus and has a maximum SUV of 22.8. Scattered bilateral pulmonary nodules are hypermetabolic indicating metastatic disease. Considerable mediastinal and hilar pathologic adenopathy. Faintly activity in the adrenal glands could represent early metastatic disease to the adrenals. 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 2. There is a small hypermetabolic focus along the right posterior glottic level in the vicinity of the right arytenoid cartilage without CT correlate, maximum SUV 6.3. Given the lack of CT correlate this may simply be physiologic but is focally prominent and may merit attention on follow up studies.      04/21/2016 Pathology Results    Guardant360- Alterations detected: TP53. SMO, MYC, RB1, BRCA1, GATA3, and MET.  ALK/EGFR NEGATIVE.      04/24/2016 -  Chemotherapy    The patient had palonosetron (ALOXI) injection 0.25 mg, 0.25 mg, Intravenous,  Once, 1 of 4 cycles  CARBOplatin (PARAPLATIN) 240 mg in sodium chloride 0.9 % 250 mL chemo infusion, 240 mg (100 % of original dose 241 mg), Intravenous,  Once, 1 of 4 cycles Dose modification:   (original dose 241 mg, Cycle 1)  gemcitabine (GEMZAR) 1,026 mg in sodium chloride 0.9 % 250 mL chemo infusion, 750 mg/m2 = 1,026 mg (100 % of original dose 750 mg/m2),  Intravenous,  Once, 1 of 4 cycles Dose modification: 750 mg/m2 (original dose 750 mg/m2, Cycle 1, Reason: Provider Judgment)  for chemotherapy treatment.         She did not tolerate Day 1, Cycle 1 well.  She had issue with dehydration and symptomatic hypotension.  She returned on 04/29/2016 for IVF.  She feels much improved since then.  She denies any complaints today.   She is here finishing day 8 of cycle 1.    Review of Systems  Constitutional: Positive for malaise/fatigue and weight loss. Negative for chills and fever.  HENT: Negative.   Eyes: Negative.   Respiratory: Negative.  Negative for cough.   Cardiovascular: Negative.  Negative for chest pain.  Gastrointestinal: Negative.  Negative for constipation, diarrhea, nausea and vomiting.  Genitourinary: Negative.   Musculoskeletal: Negative.  Negative for falls.  Skin: Negative.   Neurological: Positive for dizziness and weakness.  Endo/Heme/Allergies: Negative.   Psychiatric/Behavioral: Negative.     Past Medical History:  Diagnosis Date  . Cancer ([place])    bladder  . Elevated transaminase level   . Goals of care, counseling/discussion 05/01/2016  . H/O renal artery stenosis   . Hyperlipidemia   . Hypertension   . Osteoporosis   . Squamous cell carcinoma lung, left ([place]) 03/31/2016    Past Surgical History:  Procedure Laterality Date  . AORTIC VALVULOPLASTY  1995  . CAROTID ENDARTERECTOMY Right   . COLONOSCOPY    . FLEXIBLE BRONCHOSCOPY Bilateral 04/02/2016   Procedure: FLEXIBLE BRONCHOSCOPY;  Surgeon: Sinda Du, MD;  Location: [facility]ITE;  Service: Thoracic;  Laterality: Bilateral;  . IR GENERIC HISTORICAL  04/16/2016   IR FLUORO GUIDE PORT INSERTION RIGHT 04/16/2016 Markus Daft, MD [facility]-INTERV RAD  . IR GENERIC HISTORICAL  04/16/2016   IR US GUIDE VASC ACCESS RIGHT 04/16/2016 Markus Daft, MD [facility]-INTER[facility] RAD  . [place]   "blockages" - ?stenting  . TUBAL LIGATION      Family History  Problem Relation  Age of Onset  . Hypertension Sister   . Hypertension Other   . Stroke Other   . Heart attack Other   . Dementia Mother 41    significant ASCVD  . Lung cancer Father 27  . Brain cancer Brother 46    Social History   Social History  . Marital status: Married    Spouse name: N/A  . Number of children: N/A  . Years of education: N/A   Occupational History  . 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 Smoking status: Current Every Day Smoker    Packs/day: 1.00    Types: Cigarettes  . Smokeless tobacco: Never Used     Comment: currently only 4 cigs/day  . Alcohol use No  . Drug use: No  . Sexual activity: Not Asked     Comment: married   Other Topics Concern  . None   Social History Narrative  . None     PHYSICAL EXAMINATION  ECOG PERFORMANCE STATUS: 1 - Symptomatic but completely ambulatory  There were no vitals filed for this visit.  Vitals - 1 value per visit 9/32/6712  SYSTOLIC 99  DIASTOLIC 34  Pulse 72  Temperature 98.2  Respirations 16  Weight (lb) 91.9    GENERAL:alert, no distress, well nourished, well developed, cachectic, comfortable, cooperative, smiling and in chemo-bed, accompanied by son. SKIN: skin color, texture, turgor are normal, no rashes or significant lesions HEAD: Normocephalic, No masses, lesions, tenderness or abnormalities EYES: normal, EOMI, Conjunctiva are pink and non-injected EARS: External ears normal OROPHARYNX:lips, buccal mucosa, and tongue normal and mucous membranes are moist  NECK: supple, trachea midline LYMPH:  not examined BREAST:not examined LUNGS: clear to auscultation  HEART: regular rate & rhythm, no murmurs and no gallops ABDOMEN:abdomen soft and normal bowel sounds BACK: Back symmetric, no curvature. EXTREMITIES:less then 2 second capillary refill, no joint deformities, effusion, or inflammation, no skin discoloration, no cyanosis  NEURO: alert & oriented x 3 with fluent speech, no focal  motor/sensory deficits    LABORATORY DATA: CBC    Component Value Date/Time   WBC 5.8 05/01/2016 1203   RBC 3.20 (L) 05/01/2016 1203   HGB 8.3 (L) 05/01/2016 1203   HCT 27.1 (L) 05/01/2016 1203   PLT 144 (L) 05/01/2016 1203   MCV 84.7 05/01/2016 1203   MCH 25.9 (L) 05/01/2016 1203   MCHC 30.6 05/01/2016 1203   RDW 15.9 (H) 05/01/2016 1203   LYMPHSABS 0.8 05/01/2016 1203   MONOABS 0.3 05/01/2016 1203   EOSABS 0.0 05/01/2016 1203   BASOSABS 0.0 05/01/2016 1203      Chemistry      Component Value Date/Time   NA 133 (L) 05/01/2016 1203   NA 140 12/27/2015 0903   K 4.8 05/01/2016 1203   CL 102 05/01/2016 1203   CO2 20 (L) 05/01/2016 1203   BUN 49 (H) 05/01/2016 1203   BUN 40 (H) 12/27/2015 0903   CREATININE 1.21 (H) 05/01/2016 1203   CREATININE 1.06 06/15/2013 0857  Component Value Date/Time   CALCIUM 9.9 05/01/2016 1203   ALKPHOS 56 05/01/2016 1203   AST 58 (H) 05/01/2016 1203   ALT 16 05/01/2016 1203   BILITOT 0.3 05/01/2016 1203   BILITOT 0.4 12/27/2015 0903        PENDING LABS:   RADIOGRAPHIC STUDIES:  Ct Abdomen Pelvis W Contrast  Result Date: 04/02/2016 CLINICAL DATA:  Left upper lobe mass, presumed lung cancer on recent chest CT. Bilateral pulmonary nodules. No abdominal complaints. EXAM: CT ABDOMEN AND PELVIS WITH CONTRAST TECHNIQUE: Multidetector CT imaging of the abdomen and pelvis was performed using the standard protocol following bolus administration of intravenous contrast. CONTRAST:  10m ISOVUE-300 IOPAMIDOL (ISOVUE-300) INJECTION 61% COMPARISON:  Chest CT 03/31/2016.  Abdominal CT 11/21/2008. FINDINGS: Lower chest: Small nodules again noted at both lung bases, likely metastases. No significant pleural or pericardial effusion. There is aortic atherosclerosis. Hepatobiliary: The liver is normal in density without focal abnormality. No evidence of gallstones, gallbladder wall thickening or biliary dilatation. Pancreas: Unremarkable. No pancreatic  ductal dilatation or surrounding inflammatory changes. Spleen: Normal in size without focal abnormality. Adrenals/Urinary Tract: The adrenal glands appear stable without suspicious findings. There are fairly extensive renovascular calcifications bilaterally. No definite urinary tract calculus, hydronephrosis or delay in contrast excretion. There are small bilateral renal cysts. 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 The bladder appears unremarkable. Stomach/Bowel: No evidence of bowel wall thickening, distention or surrounding inflammatory change. The appendix appears normal. Vascular/Lymphatic: There are no enlarged abdominal or pelvic lymph nodes. Extensive aortic and branch vessel atherosclerosis status post aorto bi-iliac grafting. There are probable renal artery grafts bilaterally. No evidence of acute vascular occlusion. Reproductive: The uterus and ovaries appear normal. Other: There are several small midline abdominal hernias containing only fat. No herniated bowel. No ascites. Musculoskeletal: No acute or significant osseous findings. Convex left scoliosis with associated multilevel spondylosis. IMPRESSION: 1. No evidence of metastatic disease within the abdomen or pelvis. 2. 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 3. Bilateral renal cysts. Electronically Signed   By: WRichardean SaleM.D.   On: 04/02/2016 09:55   Nm Pet Image Initial (pi) Skull Base To Thigh  Result Date: 04/21/2016 CLINICAL DATA:  Initial treatment strategy for left upper lobe lung mass, non-small cell lung cancer on biopsy 04/02/2016. EXAM: NUCLEAR MEDICINE PET SKULL BASE TO THIGH TECHNIQUE: 12.0 mCi F-18 FDG was injected intravenously. Full-ring PET imaging was performed from the skull base to thigh after the radiotracer. CT data was obtained and used for attenuation correction and anatomic localization. FASTING BLOOD GLUCOSE:  Value:  88 mg/dl COMPARISON:  Chest CT from 03/31/2016 FINDINGS: NECK Small hypermetabolic focus along the right posterior glottic level in the vicinity of the right arytenoid cartilage, no CT correlate, maximum SUV 6.3 Dense carotid atherosclerotic calcification. CHEST Approximately 7.7 by 5.0 cm dominant left upper lobe anterior mass with central necrosis and marginal hypermetabolic activity, maximum SUV 22.8. This occludes the left upper lobe bronchus and there is atelectasis of the rest of the left upper lobe. Considerable confluent and adjacent suprahilar and AP window adenopathy if, difficult to individually measure given the confluent and indistinctly marginated nature, but with a maximum SUV of approximately 13.0. If a long subcarinal node measures 1.8 cm in short axis and has a maximum standard uptake value of 13.6. Hypermetabolic right hilar activity has a short axis diameter of 1.5 cm and a maximum SUV of 8.5.  A 1.7 cm in short axis right axillary lymph node has a maximum standard uptake value of 4.2. Scattered bilateral pulmonary nodules are identified, most under 1 cm in size, these lesions appear to be hypermetabolic. An index subpleural nodule anteriorly in the right upper lobe measures 1.8 by 1.2 cm on image 94/ 3 and has a maximum standard uptake value of 16.9. Coronary, aortic arch, and branch vessel atherosclerotic vascular disease. ABDOMEN/PELVIS Hypermetabolic activity in the left adrenal gland with only slight nodular thickening, maximum SUV 5.0 faintly accentuated right adrenal activity without CT correlate, maximum SUV 3.9. Background hepatic SUV approximately 2.8. Presumed physiologic activity in the cecum and ascending colon without CT correlate. Several focal areas of ureteral activity ascribed to excreted FDG. Suspected renal artery grafts bilaterally. Aortoiliac atherosclerotic vascular disease. Left kidney upper pole and lower pole complex cysts. 0.7 by 0.9 cm lesion in the right mid kidney  laterally, probably a Bosniak category 2 cyst, no obvious accentuated activity in this vicinity although small renal lesions are difficult to characterize by PET-CT due to the high degree of underlying a renal background activity from FDG excretion. SKELETON No focal hypermetabolic activity to suggest skeletal metastasis. IMPRESSION: 1. Primary 7.7 by 5.0 cm left upper lobe mass occludes the left upper lobe bronchus and has a maximum SUV of 22.8. Scattered bilateral pulmonary nodules are hypermetabolic indicating metastatic disease. Considerable mediastinal and hilar pathologic adenopathy. Faintly activity in the adrenal glands could represent early metastatic disease to the adrenals. 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 2. There is a small hypermetabolic focus along the right posterior glottic level in the vicinity of the right arytenoid cartilage without CT correlate, maximum SUV 6.3. Given the lack of CT correlate this may simply be physiologic but is focally prominent and may merit attention on follow up studies. 3. Other imaging findings of potential clinical significance: Coronary, aortic arch, and branch vessel atherosclerotic vascular disease. Aortoiliac atherosclerotic vascular disease. Renal artery grafts. Bilateral renal cysts. Electronically Signed   By: Van Clines M.D.   On: 04/21/2016 13:07   Ir US Guide Vasc Access Right  Result Date: 04/16/2016 INDICATION: Primary left lung cancer. EXAM: FLUOROSCOPIC AND ULTRASOUND GUIDED PLACEMENT OF A SUBCUTANEOUS PORT COMPARISON:  None. MEDICATIONS: Vancomycin 1 g; The antibiotic was administered within an appropriate time interval prior to skin puncture. ANESTHESIA/SEDATION: Versed 1.5 mg IV; Fentanyl 75 mcg IV; Moderate Sedation Time:  34 minutes The patient was continuously monitored during the procedure by the interventional radiology nurse under my direct supervision. FLUOROSCOPY TIME:  6 seconds, 1 mGy COMPLICATIONS: None  immediate. PROCEDURE: The procedure, risks, benefits, and alternatives were explained to the patient. Questions regarding the procedure were encouraged and answered. The patient understands and consents to the procedure. Patient was placed supine on the interventional table. Ultrasound confirmed a patent right internal jugular vein. The right chest and neck were cleaned with a skin antiseptic and a sterile drape was placed. Maximal barrier sterile technique was utilized including caps, mask, sterile gowns, sterile gloves, sterile drape, hand hygiene and skin antiseptic. The right neck was anesthetized with 1% lidocaine. Small incision was made in the right neck with a blade. Micropuncture set was placed in the right internal jugular vein with ultrasound guidance. The micropuncture wire was used for measurement purposes. The right chest was anesthetized with 1% lidocaine with epinephrine. #15 blade was used to make an incision and a subcutaneous port pocket was formed. [place] was assembled. Subcutaneous tunnel  was formed with a stiff tunneling device. The port catheter was brought through the subcutaneous tunnel. The port was placed in the subcutaneous pocket. The micropuncture set was exchanged for a peel-away sheath. The catheter was placed through the peel-away sheath and the tip was positioned at the superior cavoatrial junction. Catheter placement was confirmed with fluoroscopy. The port was accessed and flushed with heparinized saline. The port pocket was closed using two layers of absorbable sutures and Dermabond. The vein skin site was closed using a single layer of absorbable suture and Dermabond. Sterile dressings were applied. Patient tolerated the procedure well without an immediate complication. Ultrasound and fluoroscopic images were taken and saved for this procedure. IMPRESSION: Placement of a subcutaneous port device. Electronically Signed   By: Markus Daft M.D.   On: 04/16/2016 14:42    Ir Fluoro Guide Port Insertion Right  Result Date: 04/16/2016 INDICATION: Primary left lung cancer. EXAM: FLUOROSCOPIC AND ULTRASOUND GUIDED PLACEMENT OF A SUBCUTANEOUS PORT COMPARISON:  None. MEDICATIONS: Vancomycin 1 g; The antibiotic was administered within an appropriate time interval prior to skin puncture. ANESTHESIA/SEDATION: Versed 1.5 mg IV; Fentanyl 75 mcg IV; Moderate Sedation Time:  34 minutes The patient was continuously monitored during the procedure by the interventional radiology nurse under my direct supervision. FLUOROSCOPY TIME:  6 seconds, 1 mGy COMPLICATIONS: None immediate. PROCEDURE: The procedure, risks, benefits, and alternatives were explained to the patient. Questions regarding the procedure were encouraged and answered. The patient understands and consents to the procedure. Patient was placed supine on the interventional table. Ultrasound confirmed a patent right internal jugular vein. The right chest and neck were cleaned with a skin antiseptic and a sterile drape was placed. Maximal barrier sterile technique was utilized including caps, mask, sterile gowns, sterile gloves, sterile drape, hand hygiene and skin antiseptic. The right neck was anesthetized with 1% lidocaine. Small incision was made in the right neck with a blade. Micropuncture set was placed in the right internal jugular vein with ultrasound guidance. The micropuncture wire was used for measurement purposes. The right chest was anesthetized with 1% lidocaine with epinephrine. #15 blade was used to make an incision and a subcutaneous port pocket was formed. [place] was assembled. Subcutaneous tunnel was formed with a stiff tunneling device. The port catheter was brought through the subcutaneous tunnel. The port was placed in the subcutaneous pocket. The micropuncture set was exchanged for a peel-away sheath. The catheter was placed through the peel-away sheath and the tip was positioned at the superior  cavoatrial junction. Catheter placement was confirmed with fluoroscopy. The port was accessed and flushed with heparinized saline. The port pocket was closed using two layers of absorbable sutures and Dermabond. The vein skin site was closed using a single layer of absorbable suture and Dermabond. Sterile dressings were applied. Patient tolerated the procedure well without an immediate complication. Ultrasound and fluoroscopic images were taken and saved for this procedure. IMPRESSION: Placement of a subcutaneous port device. Electronically Signed   By: Markus Daft M.D.   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  Started palliative systemic chemotherapy beginning on 04/24/2016 consisting of Carboplatin/Gemcitabine in a day 1, 8 every 21 days.  Oncology history developed.  Staging in [facility] problem list completed.  Pre-treatment labs today: CBC diff, CMET.  I personally reviewed and went over laboratory  results with the patient.  The results are noted within this dictation.  Labs satisfy treatment parameters today.  Anemia is noted.  Given her anemia in conjunction with her lung disease, I have recommended a 2 unit PRBCs.  She is agreeable.  We will get this set-up for the patient.  For now, given her symptomatic hypotension recently, in addition to a good BP today in the clinic after being off anti-hypertensives x 3 days, she is advised to remain off anti-hypertensive medications.  We will restart when indicated.  Return in 2 weeks for follow-up and the start of cycle #2    [place]: Orders Placed This Encounter  Procedures  . Type and screen    MEDICATIONS PRESCRIBED THIS ENCOUNTER: No orders of the defined types were placed in this encounter.   THERAPY PLAN:  Continue with palliative  chemotherapy as outlined.  Based upon performance status, we will restage after cycle #2 or #3.  All questions were answered. The patient knows to call the clinic with any problems, questions or concerns. We can certainly see the patient much sooner if necessary.  Patient and plan discussed with Dr. Ancil Linsey and she is in agreement with the aforementioned.   This note is electronically signed by: Doy Mince 05/01/2016 3:13 PM

## 2016-05-01 NOTE — Progress Notes (Signed)
Chemotherapy given today per orders. Patient tolerated it well, no problems. Hemoglobin 8.3, notified MD, orders given for two units of blood. Vitals stable and discharged home ambulatory with son. Follow up as scheduled.

## 2016-05-01 NOTE — Addendum Note (Signed)
Addended by: Baird Cancer on: 05/01/2016 05:06 PM   Modules accepted: Level of Service

## 2016-05-01 NOTE — Assessment & Plan Note (Addendum)
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  Started palliative systemic chemotherapy beginning on 04/24/2016 consisting of Carboplatin/Gemcitabine in a day 1, 8 every 21 days.  Oncology history developed.  Staging in [facility] problem list completed.  Pre-treatment labs today: CBC diff, CMET.  I personally reviewed and went over laboratory results with the patient.  The results are noted within this dictation.  Labs satisfy treatment parameters today.  Anemia is noted.  Given her anemia in conjunction with her lung disease, I have recommended a 2 unit PRBCs.  She is agreeable.  We will get this set-up for the patient.  For now, given her symptomatic hypotension recently, in addition to a good BP today in the clinic after being off anti-hypertensives x 3 days, she is advised to remain off anti-hypertensive medications.  We will restart when indicated.  Return in 2 weeks for follow-up and the start of cycle #2

## 2016-05-02 ENCOUNTER — Encounter (HOSPITAL_BASED_OUTPATIENT_CLINIC_OR_DEPARTMENT_OTHER): Payer: Medicare Other

## 2016-05-02 DIAGNOSIS — D630 Anemia in neoplastic disease: Secondary | ICD-10-CM | POA: Diagnosis present

## 2016-05-02 DIAGNOSIS — C3492 Malignant neoplasm of unspecified part of left bronchus or lung: Secondary | ICD-10-CM | POA: Diagnosis not present

## 2016-05-02 DIAGNOSIS — D649 Anemia, unspecified: Secondary | ICD-10-CM | POA: Diagnosis not present

## 2016-05-02 MED ORDER — ACETAMINOPHEN 325 MG PO TABS
650.0000 mg | ORAL_TABLET | Freq: Once | ORAL | Status: AC
  Administered 2016-05-02: 650 mg via ORAL

## 2016-05-02 MED ORDER — DIPHENHYDRAMINE HCL 25 MG PO CAPS
ORAL_CAPSULE | ORAL | Status: AC
  Filled 2016-05-02: qty 1

## 2016-05-02 MED ORDER — ACETAMINOPHEN 325 MG PO TABS
ORAL_TABLET | ORAL | Status: AC
  Filled 2016-05-02: qty 2

## 2016-05-02 MED ORDER — HEPARIN SOD (PORK) LOCK FLUSH 100 UNIT/ML IV SOLN
500.0000 [IU] | Freq: Every day | INTRAVENOUS | Status: AC | PRN
  Administered 2016-05-02: 500 [IU]

## 2016-05-02 MED ORDER — SODIUM CHLORIDE 0.9 % IV SOLN
250.0000 mL | Freq: Once | INTRAVENOUS | Status: AC
  Administered 2016-05-02: 250 mL via INTRAVENOUS

## 2016-05-02 MED ORDER — SODIUM CHLORIDE 0.9% FLUSH
10.0000 mL | INTRAVENOUS | Status: AC | PRN
  Administered 2016-05-02: 10 mL

## 2016-05-02 MED ORDER — DIPHENHYDRAMINE HCL 25 MG PO CAPS
25.0000 mg | ORAL_CAPSULE | Freq: Once | ORAL | Status: AC
  Administered 2016-05-02: 25 mg via ORAL

## 2016-05-02 NOTE — Patient Instructions (Signed)
[place] at [facility] Discharge Instructions  RECOMMENDATIONS MADE BY THE CONSULTANT AND ANY TEST RESULTS WILL BE SENT TO YOUR REFERRING PHYSICIAN.  2 units of PRBC Follow up as scheduled.  Thank you for choosing [place] at [facility] to provide your oncology and hematology care.  To afford each patient quality time with our provider, please arrive at least 15 minutes before your scheduled appointment time.    If you have a lab appointment with the [facility] please come in thru the  Main Entrance and check in at the main information desk  You need to re-schedule your appointment should you arrive 10 or more minutes late.  We strive to give you quality time with our providers, and arriving late affects you and other patients whose appointments are after yours.  Also, if you no show three or more times for appointments you may be dismissed from the clinic at the providers discretion.     Again, thank you for choosing [facility].  Our hope is that these requests will decrease the amount of time that you wait before being seen by our physicians.       _____________________________________________________________  Should you have questions after your visit to [facility], please contact our office at [phone] between the hours of 8:30 a.m. and 4:30 p.m.  Voicemails left after 4:30 p.m. will not be returned until the following business day.  For prescription refill requests, have your pharmacy contact our office.       Resources For Cancer Patients and their Caregivers ? American Cancer Society: Can assist with transportation, wigs, general needs, runs Look Good Feel Better.        2155057075 ? Cancer Care: Provides financial assistance, online support groups, medication/co-pay assistance.  1-800-813-HOPE (279)559-4524) ? [place] Assists [place] cancer patients and their  families through emotional , educational and financial support.  551-512-5396 ? [place] DSS Where to apply for food stamps, Medicaid and utility assistance. 959-155-8538 ? [facility]: Transportation to medical appointments. 515-766-6373 ? Social Security Administration: May apply for disability if have a Stage IV cancer. (213)614-1175 272-062-5283 ? [employer], Disability and Transit Services: Assists with nutrition, care and transit needs. [place] Support Programs: '@10RELATIVEDAYS'$ @ > Cancer Support Group  2nd Tuesday of the month 1pm-2pm, Journey Room  > Creative Journey  3rd Tuesday of the month 1130am-1pm, Journey Room  > Look Good Feel Better  1st Wednesday of the month 10am-12 noon, Journey Room (Call [place] to register [phone])

## 2016-05-02 NOTE — Progress Notes (Signed)
2 units of blood given today per orders. Patient tolerated it well, no problems. Vitals stable throughout. Follow up as scheduled.

## 2016-05-03 LAB — TYPE AND SCREEN
ABO/RH(D): O NEG
Antibody Screen: NEGATIVE
UNIT DIVISION: 0
Unit division: 0

## 2016-05-07 ENCOUNTER — Emergency Department (HOSPITAL_COMMUNITY): Payer: Medicare Other

## 2016-05-07 ENCOUNTER — Inpatient Hospital Stay (HOSPITAL_COMMUNITY)
Admission: EM | Admit: 2016-05-07 | Discharge: 2016-05-10 | DRG: 377 | Disposition: A | Discharge status: Home / Self Care | Payer: Medicare Other | Attending: Internal Medicine | Admitting: Internal Medicine

## 2016-05-07 ENCOUNTER — Encounter (HOSPITAL_COMMUNITY): Payer: Self-pay | Admitting: Emergency Medicine

## 2016-05-07 ENCOUNTER — Other Ambulatory Visit: Payer: Self-pay

## 2016-05-07 DIAGNOSIS — C3490 Malignant neoplasm of unspecified part of unspecified bronchus or lung: Secondary | ICD-10-CM

## 2016-05-07 DIAGNOSIS — D696 Thrombocytopenia, unspecified: Secondary | ICD-10-CM | POA: Diagnosis not present

## 2016-05-07 DIAGNOSIS — E785 Hyperlipidemia, unspecified: Secondary | ICD-10-CM | POA: Diagnosis present

## 2016-05-07 DIAGNOSIS — E869 Volume depletion, unspecified: Secondary | ICD-10-CM | POA: Diagnosis present

## 2016-05-07 DIAGNOSIS — N289 Disorder of kidney and ureter, unspecified: Secondary | ICD-10-CM | POA: Diagnosis not present

## 2016-05-07 DIAGNOSIS — Z7982 Long term (current) use of aspirin: Secondary | ICD-10-CM

## 2016-05-07 DIAGNOSIS — D649 Anemia, unspecified: Secondary | ICD-10-CM | POA: Diagnosis not present

## 2016-05-07 DIAGNOSIS — N179 Acute kidney failure, unspecified: Secondary | ICD-10-CM | POA: Diagnosis present

## 2016-05-07 DIAGNOSIS — K3189 Other diseases of stomach and duodenum: Secondary | ICD-10-CM | POA: Diagnosis not present

## 2016-05-07 DIAGNOSIS — I959 Hypotension, unspecified: Secondary | ICD-10-CM | POA: Diagnosis present

## 2016-05-07 DIAGNOSIS — E43 Unspecified severe protein-calorie malnutrition: Secondary | ICD-10-CM | POA: Diagnosis present

## 2016-05-07 DIAGNOSIS — D6959 Other secondary thrombocytopenia: Secondary | ICD-10-CM | POA: Diagnosis present

## 2016-05-07 DIAGNOSIS — Z66 Do not resuscitate: Secondary | ICD-10-CM | POA: Diagnosis present

## 2016-05-07 DIAGNOSIS — E7800 Pure hypercholesterolemia, unspecified: Secondary | ICD-10-CM | POA: Diagnosis not present

## 2016-05-07 DIAGNOSIS — K922 Gastrointestinal hemorrhage, unspecified: Secondary | ICD-10-CM | POA: Diagnosis not present

## 2016-05-07 DIAGNOSIS — R778 Other specified abnormalities of plasma proteins: Secondary | ICD-10-CM

## 2016-05-07 DIAGNOSIS — Z801 Family history of malignant neoplasm of trachea, bronchus and lung: Secondary | ICD-10-CM

## 2016-05-07 DIAGNOSIS — Z9221 Personal history of antineoplastic chemotherapy: Secondary | ICD-10-CM | POA: Diagnosis not present

## 2016-05-07 DIAGNOSIS — K5900 Constipation, unspecified: Secondary | ICD-10-CM | POA: Diagnosis present

## 2016-05-07 DIAGNOSIS — I10 Essential (primary) hypertension: Secondary | ICD-10-CM | POA: Diagnosis present

## 2016-05-07 DIAGNOSIS — Z823 Family history of stroke: Secondary | ICD-10-CM

## 2016-05-07 DIAGNOSIS — R7989 Other specified abnormal findings of blood chemistry: Secondary | ICD-10-CM | POA: Diagnosis not present

## 2016-05-07 DIAGNOSIS — Z8249 Family history of ischemic heart disease and other diseases of the circulatory system: Secondary | ICD-10-CM

## 2016-05-07 DIAGNOSIS — K319 Disease of stomach and duodenum, unspecified: Secondary | ICD-10-CM | POA: Diagnosis present

## 2016-05-07 DIAGNOSIS — D62 Acute posthemorrhagic anemia: Secondary | ICD-10-CM

## 2016-05-07 DIAGNOSIS — Z8551 Personal history of malignant neoplasm of bladder: Secondary | ICD-10-CM

## 2016-05-07 DIAGNOSIS — M810 Age-related osteoporosis without current pathological fracture: Secondary | ICD-10-CM | POA: Diagnosis present

## 2016-05-07 DIAGNOSIS — K269 Duodenal ulcer, unspecified as acute or chronic, without hemorrhage or perforation: Secondary | ICD-10-CM | POA: Diagnosis not present

## 2016-05-07 DIAGNOSIS — F17210 Nicotine dependence, cigarettes, uncomplicated: Secondary | ICD-10-CM | POA: Diagnosis present

## 2016-05-07 DIAGNOSIS — D638 Anemia in other chronic diseases classified elsewhere: Secondary | ICD-10-CM | POA: Diagnosis not present

## 2016-05-07 DIAGNOSIS — R1084 Generalized abdominal pain: Secondary | ICD-10-CM | POA: Diagnosis not present

## 2016-05-07 DIAGNOSIS — T451X5A Adverse effect of antineoplastic and immunosuppressive drugs, initial encounter: Secondary | ICD-10-CM | POA: Diagnosis present

## 2016-05-07 DIAGNOSIS — K264 Chronic or unspecified duodenal ulcer with hemorrhage: Principal | ICD-10-CM | POA: Diagnosis present

## 2016-05-07 DIAGNOSIS — Z808 Family history of malignant neoplasm of other organs or systems: Secondary | ICD-10-CM

## 2016-05-07 DIAGNOSIS — K254 Chronic or unspecified gastric ulcer with hemorrhage: Secondary | ICD-10-CM | POA: Diagnosis not present

## 2016-05-07 DIAGNOSIS — C3492 Malignant neoplasm of unspecified part of left bronchus or lung: Secondary | ICD-10-CM | POA: Diagnosis present

## 2016-05-07 DIAGNOSIS — Z681 Body mass index (BMI) 19 or less, adult: Secondary | ICD-10-CM

## 2016-05-07 DIAGNOSIS — K921 Melena: Secondary | ICD-10-CM | POA: Diagnosis not present

## 2016-05-07 HISTORY — DX: Do not resuscitate: Z66

## 2016-05-07 LAB — CBC WITH DIFFERENTIAL/PLATELET
BASOS PCT: 0 %
Basophils Absolute: 0 10*3/uL (ref 0.0–0.1)
EOS ABS: 0 10*3/uL (ref 0.0–0.7)
EOS PCT: 0 %
HCT: 28.2 % — ABNORMAL LOW (ref 36.0–46.0)
Hemoglobin: 9.1 g/dL — ABNORMAL LOW (ref 12.0–15.0)
Lymphocytes Relative: 21 %
Lymphs Abs: 0.8 10*3/uL (ref 0.7–4.0)
MCH: 26.5 pg (ref 26.0–34.0)
MCHC: 32.3 g/dL (ref 30.0–36.0)
MCV: 82.2 fL (ref 78.0–100.0)
MONO ABS: 0.2 10*3/uL (ref 0.1–1.0)
MONOS PCT: 5 %
Neutro Abs: 2.6 10*3/uL (ref 1.7–7.7)
Neutrophils Relative %: 74 %
PLATELETS: 28 10*3/uL — AB (ref 150–400)
RBC: 3.43 MIL/uL — ABNORMAL LOW (ref 3.87–5.11)
RDW: 16.9 % — AB (ref 11.5–15.5)
SMEAR REVIEW: DECREASED
WBC: 3.6 10*3/uL — ABNORMAL LOW (ref 4.0–10.5)

## 2016-05-07 LAB — COMPREHENSIVE METABOLIC PANEL
ALBUMIN: 2.7 g/dL — AB (ref 3.5–5.0)
ALK PHOS: 81 U/L (ref 38–126)
ALT: 19 U/L (ref 14–54)
AST: 70 U/L — ABNORMAL HIGH (ref 15–41)
Anion gap: 12 (ref 5–15)
BILIRUBIN TOTAL: 0.5 mg/dL (ref 0.3–1.2)
BUN: 75 mg/dL — AB (ref 6–20)
CALCIUM: 9.3 mg/dL (ref 8.9–10.3)
CO2: 20 mmol/L — ABNORMAL LOW (ref 22–32)
CREATININE: 1.6 mg/dL — AB (ref 0.44–1.00)
Chloride: 99 mmol/L — ABNORMAL LOW (ref 101–111)
GFR calc Af Amer: 36 mL/min — ABNORMAL LOW (ref >60)
GFR, EST NON AFRICAN AMERICAN: 31 mL/min — AB (ref >60)
GLUCOSE: 179 mg/dL — AB (ref 65–99)
Potassium: 4.4 mmol/L (ref 3.5–5.1)
Sodium: 131 mmol/L — ABNORMAL LOW (ref 135–145)
TOTAL PROTEIN: 6.7 g/dL (ref 6.5–8.1)

## 2016-05-07 LAB — I-STAT CG4 LACTIC ACID, ED: Lactic Acid, Venous: 2.97 mmol/L (ref 0.5–1.9)

## 2016-05-07 LAB — HEMATOCRIT: HCT: 21.5 % — ABNORMAL LOW (ref 36.0–46.0)

## 2016-05-07 LAB — TROPONIN I: Troponin I: 0.07 ng/mL (ref <0.03)

## 2016-05-07 LAB — PROTIME-INR
INR: 1.3
Prothrombin Time: 16.3 seconds — ABNORMAL HIGH (ref 11.4–15.2)

## 2016-05-07 LAB — HEMOGLOBIN: Hemoglobin: 7.1 g/dL — ABNORMAL LOW (ref 12.0–15.0)

## 2016-05-07 MED ORDER — PANTOPRAZOLE SODIUM 40 MG IV SOLR
INTRAVENOUS | Status: AC
  Filled 2016-05-07: qty 160

## 2016-05-07 MED ORDER — SODIUM CHLORIDE 0.9 % IV BOLUS (SEPSIS)
500.0000 mL | Freq: Once | INTRAVENOUS | Status: AC
  Administered 2016-05-07: 500 mL via INTRAVENOUS

## 2016-05-07 MED ORDER — PANTOPRAZOLE SODIUM 40 MG IV SOLR
40.0000 mg | Freq: Two times a day (BID) | INTRAVENOUS | Status: DC
Start: 2016-05-11 — End: 2016-05-07

## 2016-05-07 MED ORDER — DEXTROSE-NACL 5-0.9 % IV SOLN
INTRAVENOUS | Status: DC
  Administered 2016-05-08 – 2016-05-09 (×2): via INTRAVENOUS

## 2016-05-07 MED ORDER — SODIUM CHLORIDE 0.9 % IV SOLN
8.0000 mg/h | INTRAVENOUS | Status: DC
  Administered 2016-05-07 – 2016-05-10 (×6): 8 mg/h via INTRAVENOUS
  Filled 2016-05-07 (×8): qty 80

## 2016-05-07 MED ORDER — ZOLPIDEM TARTRATE 5 MG PO TABS
5.0000 mg | ORAL_TABLET | Freq: Every evening | ORAL | Status: DC | PRN
  Administered 2016-05-09 (×2): 5 mg via ORAL
  Filled 2016-05-07 (×2): qty 1

## 2016-05-07 MED ORDER — ONDANSETRON HCL 4 MG PO TABS: 4.0000 mg | ORAL_TABLET | Freq: Four times a day (QID) | ORAL | Status: DC | PRN

## 2016-05-07 MED ORDER — SODIUM CHLORIDE 0.9 % IV SOLN
80.0000 mg | Freq: Once | INTRAVENOUS | Status: AC
  Administered 2016-05-07: 80 mg via INTRAVENOUS
  Filled 2016-05-07: qty 80

## 2016-05-07 MED ORDER — ONDANSETRON HCL 4 MG/2ML IJ SOLN: 4.0000 mg | Freq: Four times a day (QID) | INTRAMUSCULAR | Status: DC | PRN

## 2016-05-07 MED ORDER — SODIUM CHLORIDE 0.9 % IV SOLN: 10.0000 mL/h | Freq: Once | INTRAVENOUS | Status: DC

## 2016-05-07 MED ORDER — SODIUM CHLORIDE 0.9 % IV SOLN: INTRAVENOUS | Status: DC

## 2016-05-07 MED ORDER — PRAVASTATIN SODIUM 10 MG PO TABS
20.0000 mg | ORAL_TABLET | Freq: Every day | ORAL | Status: DC
  Administered 2016-05-08 – 2016-05-09 (×2): 20 mg via ORAL
  Filled 2016-05-07 (×2): qty 2

## 2016-05-07 MED ORDER — SODIUM CHLORIDE 0.9 % IV BOLUS (SEPSIS)
1000.0000 mL | Freq: Once | INTRAVENOUS | Status: AC
  Administered 2016-05-07: 1000 mL via INTRAVENOUS

## 2016-05-07 NOTE — ED Notes (Signed)
To X-ray

## 2016-05-07 NOTE — ED Triage Notes (Signed)
Pt started having rectal bleeding today. Pt has chemo last week for cancer and received 2 units of blood on Friday.

## 2016-05-07 NOTE — H&P (Signed)
History and Physical    Michelle Mendoza DZH:[id number] DOB: November 29, 1940 DOA: 05/07/2016  PCP: Sallee Lange, MD  Patient coming from: home.    Chief Complaint:  Black stool.   HPI: Michelle Mendoza is an 76 y.o. female with hx of SCC of the lung undergoing Chemotherapy (Dr Whitney Muse), hx of HTN, HLD, presented to the ER with back stool  She did not have any abdominal pain, and denied using OTC NSAIDS, or on steroids.  She has no orthostatic symptomology, but in the ER, her BP was low at 75 to 95 Systolic.  She tolerated it well, and is alert, orient, and conversing without diaphoresis.  Further work up included a Hb of 9.5 g per dL, which is at he baseline. Her BUN was elevated to 76 with Cr of 1.6.  Her platelet count was 25K, and she was given IVF, along with 1 bag of platelets.  GI was consulted by EDP, and Dr Dereck Leep will see he in the am.  IV Protonix bolus and drip was given.   ED Course:  See above.  Rewiew of Systems:  Constitutional: Negative for malaise, fever and chills. No significant weight loss or weight gain Eyes: Negative for eye pain, redness and discharge, diplopia, visual changes, or flashes of light. ENMT: Negative for ear pain, hoarseness, nasal congestion, sinus pressure and sore throat. No headaches; tinnitus, drooling, or problem swallowing. Cardiovascular: Negative for chest pain, palpitations, diaphoresis, dyspnea and peripheral edema. ; No orthopnea, PND Respiratory: Negative for cough, hemoptysis, wheezing and stridor. No pleuritic chestpain. Gastrointestinal: Negative for diarrhea, constipation,  blood in stool, hematemesis, jaundice and rectal bleeding.    Genitourinary: Negative for frequency, dysuria, incontinence,flank pain and hematuria; Musculoskeletal: Negative for back pain and neck pain. Negative for swelling and trauma.;  Skin: . Negative for pruritus, rash, abrasions, bruising and skin lesion.; ulcerations Neuro: Negative for headache, lightheadedness  and neck stiffness. Negative for weakness, altered level of consciousness , altered mental status, extremity weakness, burning feet, involuntary movement, seizure and syncope.  Psych: negative for anxiety, depression, insomnia, tearfulness, panic attacks, hallucinations, paranoia, suicidal or homicidal ideation    Past Medical History:  Diagnosis Date  . Cancer ([place])    bladder  . DNR (do not resuscitate)   . Elevated transaminase level   . Goals of care, counseling/discussion 05/01/2016  . H/O renal artery stenosis   . Hyperlipidemia   . Hypertension   . Osteoporosis   . Squamous cell carcinoma lung, left ([place]) 03/31/2016     Past Surgical History:  Procedure Laterality Date  . AORTIC VALVULOPLASTY  1995  . CAROTID ENDARTERECTOMY Right   . COLONOSCOPY    . FLEXIBLE BRONCHOSCOPY Bilateral 04/02/2016   Procedure: FLEXIBLE BRONCHOSCOPY;  Surgeon: Sinda Du, MD;  Location: [facility];  Service: Thoracic;  Laterality: Bilateral;  . IR GENERIC HISTORICAL  04/16/2016   IR FLUORO GUIDE PORT INSERTION RIGHT 04/16/2016 Markus Daft, MD [facility]  . IR GENERIC HISTORICAL  04/16/2016   IR US GUIDE VASC ACCESS RIGHT 04/16/2016 Markus Daft, MD [facility]  . [place]   "blockages" - ?stenting  . TUBAL LIGATION       reports that she has been smoking Cigarettes.  She has been smoking about 1.00 pack per day. She has never used smokeless tobacco. She reports that she does not drink alcohol or use drugs.  Allergies  Allergen Reactions  . Penicillins Other (See Comments)    Childhood allergy. Has patient had  a PCN reaction causing immediate rash, facial/tongue/throat swelling, SOB or lightheadedness with hypotension: unknown Has patient had a PCN reaction causing severe rash involving mucus membranes or skin necrosis: unknown Has patient had a PCN reaction that required hospitalization: unknown Has patient had a PCN reaction occurring within the last 10 years: unknown If all  of the above answers are "NO", then may proceed with Cephalosporin use.   . Sulfa Antibiotics Other (See Comments)    unknown    Family History  Problem Relation Age of Onset  . Hypertension Sister   . Hypertension Other   . Stroke Other   . Heart attack Other   . Dementia Mother 2    significant ASCVD  . Lung cancer Father 97  . Brain cancer Brother 74     Prior to Admission medications   Medication Sig Start Date End Date Taking? Authorizing Provider  aspirin EC 81 MG tablet Take 81 mg by mouth daily.   Yes Historical Provider, MD  calcium-vitamin D (OSCAL WITH D) 500-200 MG-UNIT per tablet Take 1 tablet by mouth daily.    Yes Historical Provider, MD  co-enzyme Q-10 30 MG capsule Take 100 mg by mouth daily.   Yes Historical Provider, MD  Cyanocobalamin (B-12) 250 MCG TABS Take 1 tablet by mouth daily.    Yes Historical Provider, MD  megestrol (MEGACE) 400 MG/10ML suspension Place 10 mLs (400 mg total) into feeding tube 2 (two) times daily. Patient taking differently: Take 400 mg by mouth 2 (two) times daily.  04/11/16  Yes Patrici Ranks, MD  multivitamin-lutein (OCUVITE-LUTEIN) CAPS capsule Take 1 capsule by mouth daily.   Yes Historical Provider, MD  pravastatin (PRAVACHOL) 20 MG tablet Take 1 tablet (20 mg total) by mouth daily. 01/01/16  Yes Kathyrn Drown, MD  sennosides-docusate sodium (SENOKOT-S) 8.6-50 MG tablet Take 1 tablet by mouth daily.   Yes Historical Provider, MD  zolpidem (AMBIEN) 5 MG tablet Take 1 tablet (5 mg total) by mouth at bedtime as needed for sleep. 07/05/14  Yes Kathyrn Drown, MD  acetaminophen (TYLENOL) 500 MG tablet Take 1,000 mg by mouth every 6 (six) hours as needed for moderate pain.    Historical Provider, MD  alendronate (FOSAMAX) 70 MG tablet Take 1 tablet (70 mg total) by mouth every 7 (seven) days. Take with a full glass of water on an empty stomach sitting upright. Patient not taking: Reported on 05/07/2016 07/03/15   Kathyrn Drown, MD    aspirin 81 MG tablet Take 81 mg by mouth daily.    Historical Provider, MD  atenolol (TENORMIN) 50 MG tablet TAKE (1) TABLET BY MOUTH TWICE DAILY. Patient not taking: Reported on 05/07/2016 01/01/16   Kathyrn Drown, MD  CARBOPLATIN IV Inject into the vein.    Historical Provider, MD  Gemcitabine HCl (GEMZAR IV) Inject into the vein.    Historical Provider, MD  lidocaine-prilocaine (EMLA) cream Apply a quarter size amount to affected area 1 hour prior to coming to chemotherapy. Cover with plastic. 04/16/16   Patrici Ranks, MD  lisinopril-hydrochlorothiazide (PRINZIDE,ZESTORETIC) 20-12.5 MG tablet Take 1 tablet by mouth every morning. Patient not taking: Reported on 05/07/2016 01/01/16   Kathyrn Drown, MD  ondansetron (ZOFRAN) 8 MG tablet Take 1 tablet (8 mg total) by mouth every 8 (eight) hours as needed for nausea or vomiting. 04/16/16   Patrici Ranks, MD  prochlorperazine (COMPAZINE) 10 MG tablet Take 1 tablet (10 mg total) by mouth every 6 (  six) hours as needed for nausea or vomiting. 04/16/16   Patrici Ranks, MD  traMADol (ULTRAM) 50 MG tablet Take 1-2 tablets (50-100 mg total) by mouth every 6 (six) hours as needed for moderate pain or severe pain. 04/28/16   [facility], PA-C    Physical Exam: Vitals:   05/07/16 1945 05/07/16 2030 05/07/16 2100 05/07/16 2200  BP: (!) 84/57 (!) 88/61 (!) 75/51 95/57  Pulse:  102 97 78  Resp: 21 (!) '30 17 20  '$ Temp:      TempSrc:      SpO2:  100% 98% 98%      Constitutional: NAD, calm, comfortable Vitals:   05/07/16 1945 05/07/16 2030 05/07/16 2100 05/07/16 2200  BP: (!) 84/57 (!) 88/61 (!) 75/51 95/57  Pulse:  102 97 78  Resp: 21 (!) '30 17 20  '$ Temp:      TempSrc:      SpO2:  100% 98% 98%   Eyes: PERRL, lids and conjunctivae normal ENMT: Mucous membranes are moist. Posterior pharynx clear of any exudate or lesions.Normal dentition.  Neck: normal, supple, no masses, no thyromegaly Respiratory: clear to auscultation bilaterally, no  wheezing, no crackles. Normal respiratory effort. No accessory muscle use.  Cardiovascular: Regular rate and rhythm, no murmurs / rubs / gallops. No extremity edema. 2+ pedal pulses. No carotid bruits.  Abdomen: no tenderness, no masses palpated. No hepatosplenomegaly. Bowel sounds positive.  Musculoskeletal: no clubbing / cyanosis. No joint deformity upper and lower extremities. Good ROM, no contractures. Normal muscle tone.  Skin: no rashes, lesions, ulcers. No induration Neurologic: CN 2-12 grossly intact. Sensation intact, DTR normal. Strength 5/5 in all 4.  Psychiatric: Normal judgment and insight. Alert and oriented x 3. Normal mood.     Labs on Admission: I have personally reviewed following labs and imaging studies  CBC:  Recent Labs Lab 05/01/16 1203 05/07/16 1947  WBC 5.8 3.6*  NEUTROABS 4.7 2.6  HGB 8.3* 9.1*  HCT 27.1* 28.2*  MCV 84.7 82.2  PLT 144* 28*   Basic Metabolic Panel:  Recent Labs Lab 05/01/16 1203 05/07/16 1947  NA 133* 131*  K 4.8 4.4  CL 102 99*  CO2 20* 20*  GLUCOSE 124* 179*  BUN 49* 75*  CREATININE 1.21* 1.60*  CALCIUM 9.9 9.3   GFR: Estimated Creatinine Clearance: 20.6 mL/min (by C-G formula based on SCr of 1.6 mg/dL (H)). Liver Function Tests:  Recent Labs Lab 05/01/16 1203 05/07/16 1947  AST 58* 70*  ALT 16 19  ALKPHOS 56 81  BILITOT 0.3 0.5  PROT 7.5 6.7  ALBUMIN 2.7* 2.7*   Coagulation Profile:  Recent Labs Lab 05/07/16 1947  INR 1.30   Cardiac Enzymes:  Recent Labs Lab 05/07/16 1947  TROPONINI 0.07*   Urine analysis:    Component Value Date/Time   COLORURINE AMBER (A) 03/31/2016 1242   APPEARANCEUR HAZY (A) 03/31/2016 1242   LABSPEC 1.017 03/31/2016 1242   PHURINE 6.0 03/31/2016 1242   GLUCOSEU NEGATIVE 03/31/2016 1242   HGBUR NEGATIVE 03/31/2016 1242   BILIRUBINUR NEGATIVE 03/31/2016 1242   KETONESUR NEGATIVE 03/31/2016 1242   PROTEINUR NEGATIVE 03/31/2016 1242   UROBILINOGEN 0.2 11/21/2008 1531    NITRITE NEGATIVE 03/31/2016 1242   LEUKOCYTESUR NEGATIVE 03/31/2016 1242    Radiological Exams on Admission: Dg Abd Acute W/chest  Result Date: 05/07/2016 CLINICAL DATA:  Generalized abdominal pain for 3 days. Rectal bleeding. History of lung and bladder cancer. EXAM: DG ABDOMEN ACUTE W/ 1V CHEST COMPARISON:  PET-CT  04/21/2016.  Chest radiographs 03/31/2016. FINDINGS: A right jugular Port-A-Cath terminates over the lower SVC. The cardiac silhouette is normal in size. Aortic atherosclerosis is noted. Surgical clips are present in the right lower neck. Confluent left upper lobe opacity corresponds to the known mass and associated left upper lobe collapse. Elsewhere, the lungs are hyperinflated with evidence of emphysema. Small nodules are scattered throughout both lungs as seen on the recent PET-CT. There is no evidence of new airspace consolidation, edema, sizable pleural effusion, or pneumothorax. There is no evidence of intraperitoneal free air. Gas is present in nondilated loops of bowel without evidence of obstruction. Surgical clips are present in the central abdomen and right inguinal region. Atherosclerotic vascular calcification is noted. Multilevel disc degeneration and mild levoscoliosis are present in the lumbar spine. IMPRESSION: 1. Left upper lobe mass and scattered bilateral lung nodules consistent with known malignancy. 2. No evidence of acute airspace disease. 3. Nonobstructed bowel gas pattern. 4. Aortic atherosclerosis. Electronically Signed   By: Logan Bores M.D.   On: 05/07/2016 20:41    EKG: Independently reviewed.   Assessment/Plan Principal Problem:   Upper GI bleed Active Problems:   Hyperlipemia   Renal insufficiency   Acute renal failure ([facility])   Squamous cell carcinoma lung, left ([facility])   Anemia of chronic disease   Protein-calorie malnutrition, severe    PLAN:   UPPER GI BLEED:  Will admit her to ICU.  I don't think she is bleeding briskly, but given upper GI  bleed, will admit her to ICU.  Continue with PPI IV drip, and cycle her H and H every 6 hours.  In the setting of bleeding, will give platelet as well.  Transfuse if Hb drops less than 8g per dL, which I suspect I may.  No ASA or NSAIDS.  Place on NPO and await further recommendation from GI.   CKD:  Follow.  Give IVF.  Avoid nephrotoxic drugs.  HTN:  Hold all anti HTN meds.   HLD:  Continue with statin.    DVT prophylaxis:SCD.  Code Status: DNR.  Family Communication: Son and daughter.  Disposition Plan:  Home when better.  Consults called: GI:  Dr Dereck Leep.  Admission status: Inpatient.    Saraiya Kozma MD FACP. [facility]  If 7PM-7AM, please contact night-coverage [email or web address] Password [facility]  05/07/2016, 10:33 PM

## 2016-05-07 NOTE — ED Notes (Signed)
CRITICAL VALUE ALERT  Critical value received:  Troponin 0.07  Date of notification:  05/07/2016  Time of notification:  2047  Critical value read back:YES  Nurse who received alert:  Lavenia Atlas, RN  MD notified (1st page):  [place] at 2047

## 2016-05-07 NOTE — ED Provider Notes (Signed)
[facility] DEPT Provider Note   CSN: [id number] Arrival date & time: 05/07/16  1908     History   Chief Complaint Chief Complaint  Patient presents with  . Rectal Bleeding    HPI Michelle Mendoza is a 76 y.o. female.  HPI  Pt was seen at 1915. Per pt and her family, c/o gradual onset and worsening of "black stools" for the past several days. Pt's family states pt had generalized abd pain 3 days ago, took a laxative, had a BM and abd pain improved. For the past few days, pt noticed her stools turning "black and tarry." States since this morning they have been "maroon" and "red." Has been associated with generalized weakness and decreased PO intake since her LD chemo 05/01/17. Denies any further abd pain. Pt has hx lung CA with palliative systemic chemo, LD 05/01/16. Pt had "a blood transfusion" on 05/02/16.  Family states pt's BP was "running low" for the past 2 weeks, and she has been off her HTN meds since last week. Denies CP/palpitations, no SOB/cough, no back pain, no N/V, no vaginal bleeding, no hematuria, no fevers.    Past Medical History:  Diagnosis Date  . Cancer ([place])    bladder  . DNR (do not resuscitate)   . Elevated transaminase level   . Goals of care, counseling/discussion 05/01/2016  . H/O renal artery stenosis   . Hyperlipidemia   . Hypertension   . Osteoporosis   . 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 Goals of care, counseling/discussion 05/01/2016  . Protein-calorie malnutrition, severe 04/01/2016  . Weakness 03/31/2016  . Acute renal failure ([place]) 03/31/2016  . 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 Lactic acid increased 03/31/2016  . Hyperglycemia 03/31/2016  . Anemia of chronic disease 03/31/2016  . Hypercalcemia 03/31/2016  . Insomnia 01/01/2016  . Osteoporosis 01/01/2016  . Essential hypertension, benign 01/12/2013  . Fatty liver 01/12/2013  .  Hyperlipemia 01/12/2013  . Renal insufficiency 01/12/2013    Past Surgical History:  Procedure Laterality Date  . AORTIC VALVULOPLASTY  1995  . CAROTID ENDARTERECTOMY Right   . COLONOSCOPY    . FLEXIBLE BRONCHOSCOPY Bilateral 04/02/2016   Procedure: FLEXIBLE BRONCHOSCOPY;  Surgeon: Sinda Du, MD;  Location: [facility];  Service: Thoracic;  Laterality: Bilateral;  . IR GENERIC HISTORICAL  04/16/2016   IR FLUORO GUIDE PORT INSERTION RIGHT 04/16/2016 Markus Daft, MD [facility]  . IR GENERIC HISTORICAL  04/16/2016   IR US GUIDE VASC ACCESS RIGHT 04/16/2016 Markus Daft, MD [facility]  . [place]   "blockages" - ?stenting  . TUBAL LIGATION      OB History    No data available       Home Medications    Prior to Admission medications   Medication Sig Start Date End Date Taking? Authorizing Provider  acetaminophen (TYLENOL) 500 MG tablet Take 1,000 mg by mouth every 6 (six) hours as needed for moderate pain.    Historical Provider, MD  alendronate (FOSAMAX) 70 MG tablet Take 1 tablet (70 mg total) by mouth every 7 (seven) days. Take with a full glass of water on an empty stomach sitting upright. 07/03/15   Kathyrn Drown, MD  aspirin 81 MG tablet Take 81 mg by mouth daily.    Historical Provider, MD  atenolol (TENORMIN) 50 MG tablet TAKE (1) TABLET BY MOUTH TWICE DAILY. 01/01/16  Kathyrn Drown, MD  calcium-vitamin D (OSCAL WITH D) 500-200 MG-UNIT per tablet Take 1 tablet by mouth 2 (two) times daily.    Historical Provider, MD  CARBOPLATIN IV Inject into the vein.    Historical Provider, MD  co-enzyme Q-10 30 MG capsule Take 100 mg by mouth daily.    Historical Provider, MD  Cyanocobalamin (B-12) 250 MCG TABS Take 1 tablet by mouth daily.     Historical Provider, MD  Gemcitabine HCl (GEMZAR IV) Inject into the vein.    Historical Provider, MD  lidocaine-prilocaine (EMLA) cream Apply a quarter size amount to affected area 1 hour prior to coming to chemotherapy. Cover with  plastic. 04/16/16   Patrici Ranks, MD  lisinopril-hydrochlorothiazide (PRINZIDE,ZESTORETIC) 20-12.5 MG tablet Take 1 tablet by mouth every morning. 01/01/16   Kathyrn Drown, MD  megestrol (MEGACE) 400 MG/10ML suspension Place 10 mLs (400 mg total) into feeding tube 2 (two) times daily. Patient taking differently: Take 400 mg by mouth 2 (two) times daily.  04/11/16   Patrici Ranks, MD  multivitamin-lutein (OCUVITE-LUTEIN) CAPS capsule Take 1 capsule by mouth daily.    Historical Provider, MD  nicotine (NICODERM CQ - DOSED IN MG/24 HOURS) 21 mg/24hr patch Place 21 mg onto the skin daily. 04/15/16   Historical Provider, MD  Omega-3 Fatty Acids (FISH OIL) 1000 MG CAPS Take 1 capsule by mouth daily.    Historical Provider, MD  ondansetron (ZOFRAN) 8 MG tablet Take 1 tablet (8 mg total) by mouth every 8 (eight) hours as needed for nausea or vomiting. 04/16/16   Patrici Ranks, MD  pravastatin (PRAVACHOL) 20 MG tablet Take 1 tablet (20 mg total) by mouth daily. 01/01/16   Kathyrn Drown, MD  prochlorperazine (COMPAZINE) 10 MG tablet Take 1 tablet (10 mg total) by mouth every 6 (six) hours as needed for nausea or vomiting. 04/16/16   Patrici Ranks, MD  traMADol (ULTRAM) 50 MG tablet Take 1-2 tablets (50-100 mg total) by mouth every 6 (six) hours as needed for moderate pain or severe pain. 04/28/16   Baird Cancer, PA-C  zolpidem (AMBIEN) 5 MG tablet Take 1 tablet (5 mg total) by mouth at bedtime as needed for sleep. 07/05/14   Kathyrn Drown, MD    Family History Family History  Problem Relation Age of Onset  . Hypertension Sister   . Hypertension Other   . Stroke Other   . Heart attack Other   . Dementia Mother 79    significant ASCVD  . Lung cancer Father 63  . Brain cancer Brother 32    Social History Social History  Substance Use Topics  . Smoking status: Current Every Day Smoker    Packs/day: 1.00    Types: Cigarettes  . Smokeless tobacco: Never Used     Comment: currently only  4 cigs/day  . Alcohol use No     Allergies   Penicillins and Sulfa antibiotics   Review of Systems Review of Systems ROS: Statement: All systems negative except as marked or noted in the HPI; Constitutional: Negative for fever and chills. +generalized weakness.; ; Eyes: Negative for eye pain, redness and discharge. ; ; ENMT: Negative for ear pain, hoarseness, nasal congestion, sinus pressure and sore throat. ; ; Cardiovascular: Negative for chest pain, palpitations, diaphoresis, dyspnea and peripheral edema. ; ; Respiratory: Negative for cough, wheezing and stridor. ; ; Gastrointestinal: +abd pain 3 days ago only. Negative for nausea, vomiting, hematemesis, jaundice and +blood in  stool, +rectal bleeding. . ; ; Genitourinary: Negative for dysuria, flank pain and hematuria. ; ; Musculoskeletal: Negative for back pain and neck pain. Negative for swelling and trauma.; ; Skin: Negative for pruritus, rash, abrasions, blisters, bruising and skin lesion.; ; Neuro: Negative for headache, lightheadedness and neck stiffness. Negative for altered level of consciousness, altered mental status, extremity weakness, paresthesias, involuntary movement, seizure and syncope.      Physical Exam Updated Vital Signs BP (!) 84/57   Pulse 112   Temp 98.3 F (36.8 C) (Oral)   Resp 21   SpO2 97%   Physical Exam 1920: Physical examination:  Nursing notes reviewed; Vital signs and O2 SAT reviewed;  Constitutional: Thin, frail. In no acute distress; Head:  Normocephalic, atraumatic; Eyes: EOMI, PERRL, No scleral icterus; ENMT: Mouth and pharynx normal, Mucous membranes dry; Neck: Supple, Full range of motion, No lymphadenopathy; Cardiovascular: Tachycardic rate and rhythm, No gallop; Respiratory: Breath sounds clear & equal bilaterally, No wheezes.  Speaking full sentences with ease, Normal respiratory effort/excursion; Chest: Nontender, Movement normal; Abdomen: Soft, Nontender, Nondistended, Normal bowel sounds.  Rectal exam performed w/permission of pt and ED RN chaperone present.  Anal tone normal.  Non-tender, soft maroon stool with clots in adult diaper, heme positive.  No fissures, no external hemorrhoids.; Genitourinary: No CVA tenderness; Extremities: Pulses normal, No tenderness, No edema, No calf edema or asymmetry.; Neuro: AA&Ox3, Major CN grossly intact.  Speech clear. No gross focal motor deficits in extremities.; Skin: Color normal, Warm, Dry.   ED Treatments / Results  Labs (all labs ordered are listed, but only abnormal results are displayed)   EKG  EKG Interpretation  Date/Time:  Wednesday May 07 2016 19:48:57 EST Ventricular Rate:  109 PR Interval:    QRS Duration: 76 QT Interval:  326 QTC Calculation: 439 R Axis:   65 Text Interpretation:  Sinus tachycardia RSR' in V1 or V2, probably normal variant Nonspecific T abnormalities, lateral leads When compared with ECG of 03/31/2016 Rate faster Confirmed by [facility]  MD, Nunzio Cory [phone]) on 05/07/2016 7:57:29 PM       Radiology No results found.  Procedures Procedures (including critical care time)  Medications Ordered in ED Medications  pantoprazole (PROTONIX) 80 mg in sodium chloride 0.9 % 100 mL IVPB (not administered)  pantoprazole (PROTONIX) 80 mg in sodium chloride 0.9 % 250 mL (0.32 mg/mL) infusion (not administered)  pantoprazole (PROTONIX) injection 40 mg (not administered)  sodium chloride 0.9 % bolus 1,000 mL (not administered)     Initial Impression / Assessment and Plan / ED Course  I have reviewed the triage vital signs and the nursing notes.  Pertinent labs & imaging results that were available during my care of the patient were reviewed by me and considered in my medical decision making (see chart for details).  MDM Reviewed: previous chart, nursing note and vitals Reviewed previous: labs and ECG Interpretation: labs, ECG and x-ray Total time providing critical care: 30-74 minutes. This excludes  time spent performing separately reportable procedures and services. Consults: gastrointestinal and admitting MD   CRITICAL CARE Performed by: Alfonzo Feller Total critical care time: 35 minutes Critical care time was exclusive of separately billable procedures and treating other patients. Critical care was necessary to treat or prevent imminent or life-threatening deterioration. Critical care was time spent personally by me on the following activities: development of treatment plan with patient and/or surrogate as well as nursing, discussions with consultants, evaluation of patient's response to treatment, examination of patient, obtaining history  from patient or surrogate, ordering and performing treatments and interventions, ordering and review of laboratory studies, ordering and review of radiographic studies, pulse oximetry and re-evaluation of patient's condition.   Results for orders placed or performed during the hospital encounter of 05/07/16  Comprehensive metabolic panel  Result Value Ref Range   Sodium 131 (L) 135 - 145 mmol/L   Potassium 4.4 3.5 - 5.1 mmol/L   Chloride 99 (L) 101 - 111 mmol/L   CO2 20 (L) 22 - 32 mmol/L   Glucose, Bld 179 (H) 65 - 99 mg/dL   BUN 75 (H) 6 - 20 mg/dL   Creatinine, Ser 1.60 (H) 0.44 - 1.00 mg/dL   Calcium 9.3 8.9 - 10.3 mg/dL   Total Protein 6.7 6.5 - 8.1 g/dL   Albumin 2.7 (L) 3.5 - 5.0 g/dL   AST 70 (H) 15 - 41 U/L   ALT 19 14 - 54 U/L   Alkaline Phosphatase 81 38 - 126 U/L   Total Bilirubin 0.5 0.3 - 1.2 mg/dL   GFR calc non Af Amer 31 (L) >60 mL/min   GFR calc Af Amer 36 (L) >60 mL/min   Anion gap 12 5 - 15  Troponin I  Result Value Ref Range   Troponin I 0.07 (HH) <0.03 ng/mL  CBC with Differential  Result Value Ref Range   WBC 3.6 (L) 4.0 - 10.5 K/uL   RBC 3.43 (L) 3.87 - 5.11 MIL/uL   Hemoglobin 9.1 (L) 12.0 - 15.0 g/dL   HCT 28.2 (L) 36.0 - 46.0 %   MCV 82.2 78.0 - 100.0 fL   MCH 26.5 26.0 - 34.0 pg   MCHC 32.3 30.0 -  36.0 g/dL   RDW 16.9 (H) 11.5 - 15.5 %   Platelets 28 (LL) 150 - 400 K/uL   Neutrophils Relative % 74 %   Neutro Abs 2.6 1.7 - 7.7 K/uL   Lymphocytes Relative 21 %   Lymphs Abs 0.8 0.7 - 4.0 K/uL   Monocytes Relative 5 %   Monocytes Absolute 0.2 0.1 - 1.0 K/uL   Eosinophils Relative 0 %   Eosinophils Absolute 0.0 0.0 - 0.7 K/uL   Basophils Relative 0 %   Basophils Absolute 0.0 0.0 - 0.1 K/uL   WBC Morphology ATYPICAL LYMPHOCYTES    Smear Review PLATELETS APPEAR DECREASED   Protime-INR  Result Value Ref Range   Prothrombin Time 16.3 (H) 11.4 - 15.2 seconds   INR 1.30   I-Stat CG4 Lactic Acid, ED  Result Value Ref Range   Lactic Acid, Venous 2.97 (HH) 0.5 - 1.9 mmol/L   Comment MD NOTIFIED, SUGGEST RECOLLECT   Type and screen  Result Value Ref Range   ABO/RH(D) O NEG    Antibody Screen PENDING    Sample Expiration 05/10/2016    Nm Pet Image Initial (pi) Skull Base To Thigh Result Date: 04/21/2016 CLINICAL DATA:  Initial treatment strategy for left upper lobe lung mass, non-small cell lung cancer on biopsy 04/02/2016. EXAM: NUCLEAR MEDICINE PET SKULL BASE TO THIGH TECHNIQUE: 12.0 mCi F-18 FDG was injected intravenously. Full-ring PET imaging was performed from the skull base to thigh after the radiotracer. CT data was obtained and used for attenuation correction and anatomic localization. FASTING BLOOD GLUCOSE:  Value: 88 mg/dl COMPARISON:  Chest CT from 03/31/2016 FINDINGS: NECK Small hypermetabolic focus along the right posterior glottic level in the vicinity of the right arytenoid cartilage, no CT correlate, maximum SUV 6.3 Dense carotid atherosclerotic calcification. CHEST Approximately 7.7  by 5.0 cm dominant left upper lobe anterior mass with central necrosis and marginal hypermetabolic activity, maximum SUV 22.8. This occludes the left upper lobe bronchus and there is atelectasis of the rest of the left upper lobe. Considerable confluent and adjacent suprahilar and AP window  adenopathy if, difficult to individually measure given the confluent and indistinctly marginated nature, but with a maximum SUV of approximately 13.0. If a long subcarinal node measures 1.8 cm in short axis and has a maximum standard uptake value of 13.6. Hypermetabolic right hilar activity has a short axis diameter of 1.5 cm and a maximum SUV of 8.5. A 1.7 cm in short axis right axillary lymph node has a maximum standard uptake value of 4.2. Scattered bilateral pulmonary nodules are identified, most under 1 cm in size, these lesions appear to be hypermetabolic. An index subpleural nodule anteriorly in the right upper lobe measures 1.8 by 1.2 cm on image 94/ 3 and has a maximum standard uptake value of 16.9. Coronary, aortic arch, and branch vessel atherosclerotic vascular disease. ABDOMEN/PELVIS Hypermetabolic activity in the left adrenal gland with only slight nodular thickening, maximum SUV 5.0 faintly accentuated right adrenal activity without CT correlate, maximum SUV 3.9. Background hepatic SUV approximately 2.8. Presumed physiologic activity in the cecum and ascending colon without CT correlate. Several focal areas of ureteral activity ascribed to excreted FDG. Suspected renal artery grafts bilaterally. Aortoiliac atherosclerotic vascular disease. Left kidney upper pole and lower pole complex cysts. 0.7 by 0.9 cm lesion in the right mid kidney laterally, probably a Bosniak category 2 cyst, no obvious accentuated activity in this vicinity although small renal lesions are difficult to characterize by PET-CT due to the high degree of underlying a renal background activity from FDG excretion. SKELETON No focal hypermetabolic activity to suggest skeletal metastasis. IMPRESSION: 1. Primary 7.7 by 5.0 cm left upper lobe mass occludes the left upper lobe bronchus and has a maximum SUV of 22.8. Scattered bilateral pulmonary nodules are hypermetabolic indicating metastatic disease. Considerable mediastinal and hilar  pathologic adenopathy. Faintly activity in the adrenal glands could represent early metastatic disease to the adrenals. 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 2. There is a small hypermetabolic focus along the right posterior glottic level in the vicinity of the right arytenoid cartilage without CT correlate, maximum SUV 6.3. Given the lack of CT correlate this may simply be physiologic but is focally prominent and may merit attention on follow up studies. 3. Other imaging findings of potential clinical significance: Coronary, aortic arch, and branch vessel atherosclerotic vascular disease. Aortoiliac atherosclerotic vascular disease. Renal artery grafts. Bilateral renal cysts. Electronically Signed   By: Van Clines M.D.   On: 04/21/2016 13:07    Dg Abd Acute W/chest Result Date: 05/07/2016 CLINICAL DATA:  Generalized abdominal pain for 3 days. Rectal bleeding. History of lung and bladder cancer. EXAM: DG ABDOMEN ACUTE W/ 1V CHEST COMPARISON:  PET-CT 04/21/2016.  Chest radiographs 03/31/2016. FINDINGS: A right jugular Port-A-Cath terminates over the lower SVC. The cardiac silhouette is normal in size. Aortic atherosclerosis is noted. Surgical clips are present in the right lower neck. Confluent left upper lobe opacity corresponds to the known mass and associated left upper lobe collapse. Elsewhere, the lungs are hyperinflated with evidence of emphysema. Small nodules are scattered throughout both lungs as seen on the recent PET-CT. There is no evidence of new airspace consolidation, edema, sizable pleural effusion, or pneumothorax. There is no evidence of intraperitoneal free air. Gas is present in nondilated loops  of bowel without evidence of obstruction. Surgical clips are present in the central abdomen and right inguinal region. Atherosclerotic vascular calcification is noted. Multilevel disc degeneration and mild levoscoliosis are present in the lumbar spine. IMPRESSION: 1. Left  upper lobe mass and scattered bilateral lung nodules consistent with known malignancy. 2. No evidence of acute airspace disease. 3. Nonobstructed bowel gas pattern. 4. Aortic atherosclerosis. Electronically Signed   By: Logan Bores M.D.   On: 05/07/2016 20:41    Results for Michelle Mendoza, Michelle Mendoza (MRN [id number]) as of 05/07/2016 21:01  Ref. Range 04/16/2016 08:02 04/21/2016 11:46 04/24/2016 11:05 05/01/2016 12:03 05/07/2016 19:47  BUN Latest Ref Range: 6 - 20 mg/dL 71 (H) 54 (H) 42 (H) 49 (H) 75 (H)  Creatinine Latest Ref Range: 0.44 - 1.00 mg/dL 1.54 (H) 1.50 (H) 1.35 (H) 1.21 (H) 1.60 (H)    Results for Michelle Mendoza, Michelle Mendoza (MRN [id number]) as of 05/07/2016 21:01  Ref. Range 04/16/2016 08:02 04/21/2016 11:46 04/24/2016 11:05 05/01/2016 12:03 05/07/2016 19:47  Hemoglobin Latest Ref Range: 12.0 - 15.0 g/dL 10.7 (L) 9.7 (L) 9.5 (L) 8.3 (L) 9.1 (L)  HCT Latest Ref Range: 36.0 - 46.0 % 33.7 (L) 30.1 (L) 28.6 (L) 27.1 (L) 28.2 (L)  Platelets Latest Ref Range: 150 - 400 K/uL 356 335 362 144 (L) 28 (LL)     2100:   EPIC chart reviewed: pt's baseline SBP 80-90's. New thrombocytopenia; will transfuse platelets. IVF boluses given. IV protonix bolus and gtt started. Family and pt reiterate pt is DNR: they are ok with transfusions, IVF.  T/C to GI Dr. Laural Golden, case discussed, including:  HPI, pertinent PM/SHx, VS/PE, dx testing, ED course and treatment:  Agrees with ED treatment, will consult tomorrow. T/C to [facility]. Marin Comment, case discussed, including:  HPI, pertinent PM/SHx, VS/PE, dx testing, ED course and treatment:  Agreeable to admit, requests he will come to the ED for evaluation.     Final Clinical Impressions(s) / ED Diagnoses   Final diagnoses:  None    New Prescriptions New Prescriptions   No medications on file     Francine Graven, DO 05/09/16 1606

## 2016-05-07 NOTE — Progress Notes (Signed)
eLink Physician-Brief Progress Note Patient Name: Michelle Mendoza DOB: 04/11/1941 MRN: [id number]   Date of Service  05/07/2016  HPI/Events of Note  New patient evaluation. Patient with small cell lung cancer, getting chemotherapy, presenting with melena and bright red blood rectum.   GI service has been consulted.  Initial hemoglobin was 9, hematocrit 28, platelet count of 28.   She got chemotherapy last week and also received 2 units of packed red blood cells last Friday.   Patient seen, comfortable, not in distress. Blood pressure 1:30/59, heart rate 80, respiratory rate 24, sats 96% on 2 L.   eICU Interventions  A unit of platelet has been ordered.  Getting serial hemoglobin and hematocrit.  Getting IV fluids.  Keep the nothing by mouth.  She is a full DO NOT RESUSCITATE.      Intervention Category Evaluation Type: New Patient Evaluation  Rush Landmark 05/07/2016, 10:52 PM

## 2016-05-07 NOTE — ED Notes (Signed)
CRITICAL VALUE ALERT  Critical value received:  Platelets  28  Date of notification:  05/07/2016  Time of notification:  2040  Critical value read back:Yes.    Nurse who received alert:  BKN  MD notified (1st page):  Thurnell Garbe  Time of first page:  2041  MD notified (2nd page):  Time of second page:  Responding MD:    Time MD responded:

## 2016-05-08 ENCOUNTER — Encounter (HOSPITAL_COMMUNITY)
Admission: EM | Disposition: A | Discharge status: Home / Self Care | Payer: Self-pay | Source: Home / Self Care | Attending: Internal Medicine

## 2016-05-08 ENCOUNTER — Encounter (HOSPITAL_COMMUNITY): Payer: Self-pay | Admitting: Gastroenterology

## 2016-05-08 DIAGNOSIS — K264 Chronic or unspecified duodenal ulcer with hemorrhage: Principal | ICD-10-CM

## 2016-05-08 DIAGNOSIS — K3189 Other diseases of stomach and duodenum: Secondary | ICD-10-CM

## 2016-05-08 DIAGNOSIS — K922 Gastrointestinal hemorrhage, unspecified: Secondary | ICD-10-CM

## 2016-05-08 DIAGNOSIS — K269 Duodenal ulcer, unspecified as acute or chronic, without hemorrhage or perforation: Secondary | ICD-10-CM

## 2016-05-08 DIAGNOSIS — N179 Acute kidney failure, unspecified: Secondary | ICD-10-CM

## 2016-05-08 DIAGNOSIS — D696 Thrombocytopenia, unspecified: Secondary | ICD-10-CM

## 2016-05-08 DIAGNOSIS — K921 Melena: Secondary | ICD-10-CM

## 2016-05-08 DIAGNOSIS — C3492 Malignant neoplasm of unspecified part of left bronchus or lung: Secondary | ICD-10-CM

## 2016-05-08 HISTORY — PX: ESOPHAGOGASTRODUODENOSCOPY: SHX5428

## 2016-05-08 LAB — CBC
HEMATOCRIT: 27 % — AB (ref 36.0–46.0)
HEMOGLOBIN: 9.2 g/dL — AB (ref 12.0–15.0)
MCH: 28 pg (ref 26.0–34.0)
MCHC: 34.1 g/dL (ref 30.0–36.0)
MCV: 82.3 fL (ref 78.0–100.0)
Platelets: 57 10*3/uL — ABNORMAL LOW (ref 150–400)
RBC: 3.28 MIL/uL — ABNORMAL LOW (ref 3.87–5.11)
RDW: 16.4 % — ABNORMAL HIGH (ref 11.5–15.5)
WBC: 3.1 10*3/uL — AB (ref 4.0–10.5)

## 2016-05-08 LAB — HEMATOCRIT
HEMATOCRIT: 25.1 % — AB (ref 36.0–46.0)
HEMATOCRIT: 25.1 % — AB (ref 36.0–46.0)

## 2016-05-08 LAB — PREPARE PLATELET PHERESIS: Unit division: 0

## 2016-05-08 LAB — MRSA PCR SCREENING: MRSA BY PCR: NEGATIVE

## 2016-05-08 LAB — PREPARE RBC (CROSSMATCH)

## 2016-05-08 LAB — HEMOGLOBIN
HEMOGLOBIN: 8.5 g/dL — AB (ref 12.0–15.0)
Hemoglobin: 8.6 g/dL — ABNORMAL LOW (ref 12.0–15.0)

## 2016-05-08 LAB — LACTIC ACID, PLASMA: Lactic Acid, Venous: 1.4 mmol/L (ref 0.5–1.9)

## 2016-05-08 SURGERY — EGD (ESOPHAGOGASTRODUODENOSCOPY)
Anesthesia: Moderate Sedation

## 2016-05-08 MED ORDER — LIDOCAINE VISCOUS 2 % MT SOLN
OROMUCOSAL | Status: DC | PRN
  Administered 2016-05-08: 1 via OROMUCOSAL

## 2016-05-08 MED ORDER — ONDANSETRON HCL 4 MG/2ML IJ SOLN
INTRAMUSCULAR | Status: DC | PRN
  Administered 2016-05-08: 4 mg via INTRAVENOUS

## 2016-05-08 MED ORDER — MIDAZOLAM HCL 5 MG/5ML IJ SOLN
INTRAMUSCULAR | Status: AC
  Filled 2016-05-08: qty 10

## 2016-05-08 MED ORDER — ENSURE ENLIVE PO LIQD
237.0000 mL | Freq: Two times a day (BID) | ORAL | Status: DC
  Administered 2016-05-08 – 2016-05-10 (×3): 237 mL via ORAL

## 2016-05-08 MED ORDER — MEPERIDINE HCL 100 MG/ML IJ SOLN
INTRAMUSCULAR | Status: DC | PRN
  Administered 2016-05-08: 25 mg
  Administered 2016-05-08 (×2): 25 mg via INTRAVENOUS

## 2016-05-08 MED ORDER — MEPERIDINE HCL 100 MG/ML IJ SOLN
INTRAMUSCULAR | Status: AC
  Filled 2016-05-08: qty 2

## 2016-05-08 MED ORDER — EPINEPHRINE PF 1 MG/10ML IJ SOSY
PREFILLED_SYRINGE | INTRAMUSCULAR | Status: AC
  Filled 2016-05-08: qty 10

## 2016-05-08 MED ORDER — SODIUM CHLORIDE 0.9 % IV SOLN: Freq: Once | INTRAVENOUS | Status: DC

## 2016-05-08 MED ORDER — PANTOPRAZOLE SODIUM 40 MG IV SOLR
INTRAVENOUS | Status: AC
  Filled 2016-05-08: qty 80

## 2016-05-08 MED ORDER — LIDOCAINE VISCOUS 2 % MT SOLN
OROMUCOSAL | Status: AC
  Filled 2016-05-08: qty 15

## 2016-05-08 MED ORDER — ONDANSETRON HCL 4 MG/2ML IJ SOLN
INTRAMUSCULAR | Status: AC
  Filled 2016-05-08: qty 2

## 2016-05-08 MED ORDER — SODIUM CHLORIDE 0.9 % IV SOLN
INTRAVENOUS | Status: DC
  Administered 2016-05-08: 12:00:00 via INTRAVENOUS

## 2016-05-08 MED ORDER — SODIUM CHLORIDE 0.9 % IJ SOLN
PREFILLED_SYRINGE | INTRAMUSCULAR | Status: DC | PRN
  Administered 2016-05-08: 3 mL

## 2016-05-08 MED ORDER — MIDAZOLAM HCL 5 MG/5ML IJ SOLN
INTRAMUSCULAR | Status: DC | PRN
  Administered 2016-05-08 (×4): 1 mg via INTRAVENOUS

## 2016-05-08 NOTE — Progress Notes (Signed)
1358 patient returned to ICU room# IC03 alert and still slightly sedated at this time. Vss. Patient resting quietly in bed at this time. No c/o pain or discomfort noted at this time. Family at the bedside.

## 2016-05-08 NOTE — Op Note (Signed)
[facility] Patient Name: Michelle Mendoza Procedure Date: 05/08/2016 12:46 PM MRN: [id number] Date of Birth: [birth year] Attending MD: Michelle Mendoza , MD CSN: [id number] Age: 76 Admit Type: Outpatient Procedure:                Upper GI endoscopy with bleeding control therapy Indications:              Melena Providers:                Michelle Richards, MD, Lurline Del, RN, [place] [employer].                            [place], [employer] Referring MD:              Medicines:                Midazolam 4 mg IV, Meperidine 75 mg IV, Ondansetron                            4 mg IV Complications:            No immediate complications. Estimated Blood Loss:     Estimated blood loss was minimal. Procedure:                Pre-Anesthesia Assessment:                           - Prior to the procedure, a History and Physical                            was performed, and patient medications and                            allergies were reviewed. The patient's tolerance of                            previous anesthesia was also reviewed. The risks                            and benefits of the procedure and the sedation                            options and risks were discussed with the patient.                            All questions were answered, and informed consent                            was obtained. Prior Anticoagulants: The patient has                            taken no previous anticoagulant or antiplatelet                            agents. ASA Grade Assessment: III - A patient with  severe systemic disease. After reviewing the risks                            and benefits, the patient was deemed in                            satisfactory condition to undergo the procedure.                           After obtaining informed consent, the endoscope was                            passed under direct vision. Throughout the                            procedure,  the patient's blood pressure, pulse, and                            oxygen saturations were monitored continuously. The                            EG-[id number] ([id number]) scope was introduced through the                            mouth, and advanced to the second part of duodenum.                            The upper GI endoscopy was accomplished without                            difficulty. The patient tolerated the procedure                            well. The patient tolerated the procedure well. Scope In: 25:95:63 PM Scope Out: 1:19:12 PM Total Procedure Duration: 0 hours 20 minutes 54 seconds  Findings:      The examined esophagus was normal.      A few dispersed erosions were found in the entire examined stomach.      Multiple localized erosions without bleeding were found in the duodenal       bulb.      (1) oozing cratered duodenal ulcer with adherent clot was found in the       duodenal bulb (7 mm); a second similar sized ulcer was also present -       clean base. For hemostasis, two hemostatic clips were successfully       placed. Area was successfully injected with 3 mL of a 1:10,000 solution       of epinephrine for hemostasis. Estimated blood loss was minimal. There       was no bleeding at the end of the procedure Impression:               - Normal esophagus.                           - Erosive gastropathy.                           -  Duodenal erosions without bleeding.                           - Oozing duodenal ulcer with adherent clot. Clips                            were placed. Injected.                           - No specimens collected. Moderate Sedation:      Moderate (conscious) sedation was administered by the endoscopy nurse       and supervised by the endoscopist. The following parameters were       monitored: oxygen saturation, heart rate, blood pressure, respiratory       rate, EKG, adequacy of pulmonary ventilation, and response to care.       Total  physician intraservice time was 31 minutes. Recommendation:           - Return patient to ICU for ongoing care.                           - Clear liquid diet. IV PPI infusion x 72 hrs. No                            future MRI until clips gone                           - Continue present medications.                           - No repeat upper endoscopy. Procedure Code(s):        --- Professional ---                           [phone], Esophagogastroduodenoscopy, flexible,                            transoral; with control of bleeding, any method                           99152, Moderate sedation services provided by the                            same physician or other qualified health care                            professional performing the diagnostic or                            therapeutic service that the sedation supports,                            requiring the presence of an independent trained                            observer to assist in the monitoring of the  patient's level of consciousness and physiological                            status; initial 15 minutes of intraservice time,                            patient age 80 years or older                           [phone], Moderate sedation services; each additional                            15 minutes intraservice time Diagnosis Code(s):        --- Professional ---                           K31.89, Other diseases of stomach and duodenum                           K26.9, Duodenal ulcer, unspecified as acute or                            chronic, without hemorrhage or perforation                           K26.4, Chronic or unspecified duodenal ulcer with                            hemorrhage                           K92.1, Melena (includes Hematochezia) CPT copyright 2016 [employer]. All rights reserved. The codes documented in this report are preliminary and upon coder review may   be revised to meet current compliance requirements. Michelle Mendoza. Rourk, MD Michelle Richards, MD 05/08/2016 2:20:20 PM This report has been signed electronically. Number of Addenda: 0

## 2016-05-08 NOTE — Progress Notes (Signed)
0945 Patient had large BM noted black and red in color. Mushy texture with some formed stool noted.

## 2016-05-08 NOTE — Consult Note (Signed)
Referring Provider: Kathie Dike, MD Primary Care Physician:  Sallee Lange, MD Primary Gastroenterologist:  Garfield Cornea, MD   Reason for Consultation:  GI bleed  HPI: Michelle Mendoza is a 77 y.o. 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 Notably patient has had issues with anemia dating back to December when she was hospitalized. She received 2 units of packed blood cells at that time. Received an additional 2 units of packed red blood cells on 05/02/2016. Hemoglobin on presentation was 9.1 but did come down to 7.1 prior to transfusion 2 units of packed red blood cells last night. Also notably has had a decline in her platelet count in the past one week from 144,000-20,000. Platelets were 300,000 earlier this month prior to chemotherapy. White cell count down to 3600. She also received a unit of platelets last night.  History provided by patient as well as her daughter to stay in ICU and here for years, Michelle Mendoza. [place] patient has had issues with appetite even before diagnosis of cancer. Patient describes a lot of belching. Really denies heartburn, abdominal pain on a chronic ASIS. No vomiting. She does have early satiety. Some vague dysphagia. Over the weekend patient was constipated. Daughter started her on MiraLAX but ultimately had to disimpact her. Soft stool was noted. No evidence of blood at that time. Patient was complaining of left-sided abdominal pain at that time. The following day she took a fourth of a couple of magnesium citrate and vomited one time. For the past couple of days she had felt okay. Yesterday however she became significantly fatigued, unable to ambulate. She passed a large amount of maroon stool associated with clots. There was questionable history of melena seen by patient's daughter-in-law before the maroon stools started.  She has had no BM in the past 12 hours.  Remote colonoscopy 12 years ago. Patient takes baby aspirin daily but no other NSAIDs.  CT A/P 04/01/16 with no evidence of metastatic disease. Stomach appeared normal.    Prior to Admission medications   Medication Sig Start Date End Date Taking? Authorizing Provider  aspirin EC 81 MG tablet Take 81 mg by mouth daily.   Yes Historical Provider, MD  calcium-vitamin D (OSCAL WITH D) 500-200 MG-UNIT per tablet Take 1 tablet by mouth daily.    Yes Historical Provider, MD  co-enzyme Q-10 30 MG capsule Take 100 mg by mouth daily.   Yes Historical Provider, MD  Cyanocobalamin (B-12) 250 MCG TABS Take 1 tablet by mouth daily.    Yes Historical Provider, MD  megestrol (MEGACE) 400 MG/10ML suspension Place 10 mLs (400 mg total) into feeding tube 2 (two) times daily. Patient taking differently: Take 400 mg by mouth 2 (two) times daily.  04/11/16  Yes Patrici Ranks, MD  multivitamin-lutein (OCUVITE-LUTEIN) CAPS capsule Take 1 capsule by mouth daily.   Yes Historical Provider, MD  pravastatin (PRAVACHOL) 20 MG tablet Take 1 tablet (20 mg total) by mouth daily. 01/01/16  Yes Kathyrn Drown, MD  sennosides-docusate sodium (SENOKOT-S) 8.6-50 MG tablet Take 1 tablet by mouth daily.   Yes Historical Provider, MD  zolpidem (AMBIEN) 5 MG tablet Take 1 tablet (5 mg total) by mouth at bedtime as needed for sleep. 07/05/14  Yes Kathyrn Drown, MD  acetaminophen (TYLENOL) 500 MG tablet Take 1,000 mg by mouth every 6 (six) hours as needed  for moderate pain.    Historical Provider, MD  alendronate (FOSAMAX) 70 MG tablet Take 1 tablet (70 mg total) by mouth every 7 (seven) days. Take with a full glass of water on an empty stomach sitting upright. Patient not taking: Reported on 05/07/2016 07/03/15   Kathyrn Drown, MD  aspirin 81 MG tablet Take 81 mg by mouth daily.    Historical Provider, MD  atenolol (TENORMIN) 50 MG tablet TAKE (1) TABLET BY MOUTH TWICE DAILY. Patient not  taking: Reported on 05/07/2016 01/01/16   Kathyrn Drown, MD  CARBOPLATIN IV Inject into the vein.    Historical Provider, MD  Gemcitabine HCl (GEMZAR IV) Inject into the vein.    Historical Provider, MD  lidocaine-prilocaine (EMLA) cream Apply a quarter size amount to affected area 1 hour prior to coming to chemotherapy. Cover with plastic. 04/16/16   Patrici Ranks, MD  lisinopril-hydrochlorothiazide (PRINZIDE,ZESTORETIC) 20-12.5 MG tablet Take 1 tablet by mouth every morning. Patient not taking: Reported on 05/07/2016 01/01/16   Kathyrn Drown, MD  ondansetron (ZOFRAN) 8 MG tablet Take 1 tablet (8 mg total) by mouth every 8 (eight) hours as needed for nausea or vomiting. 04/16/16   Patrici Ranks, MD  prochlorperazine (COMPAZINE) 10 MG tablet Take 1 tablet (10 mg total) by mouth every 6 (six) hours as needed for nausea or vomiting. 04/16/16   Patrici Ranks, MD  traMADol (ULTRAM) 50 MG tablet Take 1-2 tablets (50-100 mg total) by mouth every 6 (six) hours as needed for moderate pain or severe pain. 04/28/16   B[facility] Cancer, PA-C    Current Facility-Administered Medications  Medication Dose Route Frequency Provider Last Rate Last Dose  . 0.9 %  sodium chloride infusion   Intravenous Once Orvan Falconer, MD      . dextrose 5 %-0.9 % sodium chloride infusion   Intravenous Continuous Orvan Falconer, MD 100 mL/hr at 05/08/16 0840    . ondansetron (ZOFRAN) tablet 4 mg  4 mg Oral Q6H PRN Orvan Falconer, MD       Or  . ondansetron [facility]) injection 4 mg  4 mg Intravenous Q6H PRN Orvan Falconer, MD      . pantoprazole (PROTONIX) 80 mg in sodium chloride 0.9 % 250 mL (0.32 mg/mL) infusion  8 mg/hr Intravenous Continuous Francine Graven, DO 25 mL/hr at 05/08/16 0723 8 mg/hr at 05/08/16 0723  . pravastatin (PRAVACHOL) tablet 20 mg  20 mg Oral q1800 Orvan Falconer, MD      . zolpidem [facility]) tablet 5 mg  5 mg Oral QHS PRN Orvan Falconer, MD        Allergies as of 05/07/2016 - Review Complete 05/07/2016  Allergen Reaction Noted  .  Penicillins Other (See Comments) 01/01/2016  . Sulfa antibiotics Other (See Comments) 01/08/2013    Past Medical History:  Diagnosis Date  . Cancer ([place])    bladder  . DNR (do not resuscitate)   . Elevated transaminase level   . Goals of care, counseling/discussion 05/01/2016  . H/O renal artery stenosis   . Hyperlipidemia   . Hypertension   . Osteoporosis   . Squamous cell carcinoma lung, left ([place]) 03/31/2016    Past Surgical History:  Procedure Laterality Date  . AORTIC VALVULOPLASTY  1995  . CAROTID ENDARTERECTOMY Right   . COLONOSCOPY  10/2004   [place]: minimal internal hemorrhoids, otherwise normal colon. next exam 2016  . FLEXIBLE BRONCHOSCOPY Bilateral 04/02/2016   Procedure: FLEXIBLE BRONCHOSCOPY;  Surgeon: Sinda Du, MD;  Location: [facility];  Service: Thoracic;  Laterality: Bilateral;  . IR GENERIC HISTORICAL  04/16/2016   IR FLUORO GUIDE PORT INSERTION RIGHT 04/16/2016 Markus Daft, MD [facility]  . IR GENERIC HISTORICAL  04/16/2016   IR US GUIDE VASC ACCESS RIGHT 04/16/2016 Markus Daft, MD [facility]  . [place]   "blockages" - ?stenting  . TUBAL LIGATION      Family History  Problem Relation Age of Onset  . Hypertension Sister   . Hypertension Other   . Stroke Other   . Heart attack Other   . Dementia Mother 28    significant ASCVD  . Lung cancer Father 56  . Brain cancer Brother 65    Social History   Social History  . Marital status: Married    Spouse name: N/A  . Number of children: N/A  . Years of education: N/A   Occupational History  . 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 Smoking status: Current Every Day Smoker    Packs/day: 1.00    Types: Cigarettes  . Smokeless tobacco: Never Used     Comment: currently only 4 cigs/day  . Alcohol use No  . Drug use: No  . Sexual activity: Not on file     Comment: married   Other Topics Concern  . Not on file   Social History Narrative  . No narrative on  file     ROS:  General: Negative for  fever, chills. + fatigue, weakness, anorexia, weight loss Eyes: Negative for vision changes.  ENT: Negative for hoarseness, nasal congestion.see hpi CV: Negative for chest pain, angina, palpitations, dyspnea on exertion, peripheral edema.  Respiratory: Negative for dyspnea at rest, dyspnea on exertion, cough, sputum, wheezing.  GI: See history of present illness. GU:  Negative for dysuria, hematuria, urinary incontinence, urinary frequency, nocturnal urination.  MS: Negative for joint pain, low back pain.  Derm: Negative for rash or itching.  Neuro: Negative for weakness, abnormal sensation, seizure, frequent headaches, memory loss, confusion.  Psych: Negative for anxiety, depression, suicidal ideation, hallucinations.  Endo: see hpi Heme: Negative for bruising or bleeding. Allergy: Negative for rash or hives.       Physical Examination: Vital signs in last 24 hours: Temp:  [98.1 F (36.7 C)-98.9 F (37.2 C)] 98.6 F (37 C) (01/25 0745) Pulse Rate:  [77-112] 80 (01/25 0745) Resp:  [17-30] 22 (01/25 0745) BP: (75-150)/(46-83) 150/83 (01/25 0745) SpO2:  [97 %-100 %] 97 % (01/25 0745) Weight:  [91 lb 0.8 oz (41.3 kg)-97 lb 10.6 oz (44.3 kg)] 97 lb 10.6 oz (44.3 kg) (01/25 0500) Last BM Date: 05/07/16  General: thin, well-developed in no acute distress. Daughter at bedside Head: Normocephalic, atraumatic.   Eyes: Conjunctiva pale,no icterus. Mouth: Oropharyngeal mucosa moist and pink , no lesions erythema or exudate. Neck: Supple without thyromegaly, masses, or lymphadenopathy.  Lungs: Clear to auscultation bilaterally.  Heart: Regular rate and rhythm, no murmurs rubs or gallops.  Abdomen: Bowel sounds are normal, nontender, nondistended, no hepatosplenomegaly or masses, no abdominal bruits or    hernia , no rebound or guarding.   Rectal: Not performed Extremities: No lower extremity edema, clubbing, deformity.  Neuro: Alert and oriented x  4 , grossly normal neurologically.  Skin: Warm and dry, no rash or jaundice.   Psych: Alert and cooperative, normal mood and affect.        Intake/Output from previous day: 01/24 0701 - 01/25  0700 In: 1053.3 [I.V.:223.3; Blood:730; IV Piggyback:100] Out: -  Intake/Output this shift: Total I/O In: 585 [I.V.:250; Blood:335] Out: -   Lab Results: CBC  Recent Labs  05/07/16 1947 05/07/16 2233  WBC 3.6*  --   HGB 9.1* 7.1*  HCT 28.2* 21.5*  MCV 82.2  --   PLT 28*  --    BMET  Recent Labs  05/07/16 1947  NA 131*  K 4.4  CL 99*  CO2 20*  GLUCOSE 179*  BUN 75*  CREATININE 1.60*  CALCIUM 9.3   LFT  Recent Labs  05/07/16 1947  BILITOT 0.5  ALKPHOS 81  AST 70*  ALT 19  PROT 6.7  ALBUMIN 2.7*    Lipase No results for input(s): LIPASE in the last 72 hours.  PT/INR  Recent Labs  05/07/16 1947  LABPROT 16.3*  INR 1.30      Imaging Studies: Nm Pet Image Initial (pi) Skull Base To Thigh  Result Date: 04/21/2016 CLINICAL DATA:  Initial treatment strategy for left upper lobe lung mass, non-small cell lung cancer on biopsy 04/02/2016. EXAM: NUCLEAR MEDICINE PET SKULL BASE TO THIGH TECHNIQUE: 12.0 mCi F-18 FDG was injected intravenously. Full-ring PET imaging was performed from the skull base to thigh after the radiotracer. CT data was obtained and used for attenuation correction and anatomic localization. FASTING BLOOD GLUCOSE:  Value: 88 mg/dl COMPARISON:  Chest CT from 03/31/2016 FINDINGS: NECK Small hypermetabolic focus along the right posterior glottic level in the vicinity of the right arytenoid cartilage, no CT correlate, maximum SUV 6.3 Dense carotid atherosclerotic calcification. CHEST Approximately 7.7 by 5.0 cm dominant left upper lobe anterior mass with central necrosis and marginal hypermetabolic activity, maximum SUV 22.8. This occludes the left upper lobe bronchus and there is atelectasis of the rest of the left upper lobe. Considerable confluent and  adjacent suprahilar and AP window adenopathy if, difficult to individually measure given the confluent and indistinctly marginated nature, but with a maximum SUV of approximately 13.0. If a long subcarinal node measures 1.8 cm in short axis and has a maximum standard uptake value of 13.6. Hypermetabolic right hilar activity has a short axis diameter of 1.5 cm and a maximum SUV of 8.5. A 1.7 cm in short axis right axillary lymph node has a maximum standard uptake value of 4.2. Scattered bilateral pulmonary nodules are identified, most under 1 cm in size, these lesions appear to be hypermetabolic. An index subpleural nodule anteriorly in the right upper lobe measures 1.8 by 1.2 cm on image 94/ 3 and has a maximum standard uptake value of 16.9. Coronary, aortic arch, and branch vessel atherosclerotic vascular disease. ABDOMEN/PELVIS Hypermetabolic activity in the left adrenal gland with only slight nodular thickening, maximum SUV 5.0 faintly accentuated right adrenal activity without CT correlate, maximum SUV 3.9. Background hepatic SUV approximately 2.8. Presumed physiologic activity in the cecum and ascending colon without CT correlate. Several focal areas of ureteral activity ascribed to excreted FDG. Suspected renal artery grafts bilaterally. Aortoiliac atherosclerotic vascular disease. Left kidney upper pole and lower pole complex cysts. 0.7 by 0.9 cm lesion in the right mid kidney laterally, probably a Bosniak category 2 cyst, no obvious accentuated activity in this vicinity although small renal lesions are difficult to characterize by PET-CT due to the high degree of underlying a renal background activity from FDG excretion. SKELETON No focal hypermetabolic activity to suggest skeletal metastasis. IMPRESSION: 1. Primary 7.7 by 5.0 cm left upper lobe mass occludes the left upper lobe bronchus and  has a maximum SUV of 22.8. Scattered bilateral pulmonary nodules are hypermetabolic indicating metastatic disease.  Considerable mediastinal and hilar pathologic adenopathy. Faintly activity in the adrenal glands could represent early metastatic disease to the adrenals. 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 2. There is a small hypermetabolic focus along the right posterior glottic level in the vicinity of the right arytenoid cartilage without CT correlate, maximum SUV 6.3. Given the lack of CT correlate this may simply be physiologic but is focally prominent and may merit attention on follow up studies. 3. Other imaging findings of potential clinical significance: Coronary, aortic arch, and branch vessel atherosclerotic vascular disease. Aortoiliac atherosclerotic vascular disease. Renal artery grafts. Bilateral renal cysts. Electronically Signed   By: Van Clines M.D.   On: 04/21/2016 13:07   Ir US Guide Vasc Access Right  Result Date: 04/16/2016 INDICATION: Primary left lung cancer. EXAM: FLUOROSCOPIC AND ULTRASOUND GUIDED PLACEMENT OF A SUBCUTANEOUS PORT COMPARISON:  None. MEDICATIONS: Vancomycin 1 g; The antibiotic was administered within an appropriate time interval prior to skin puncture. ANESTHESIA/SEDATION: Versed 1.5 mg IV; Fentanyl 75 mcg IV; Moderate Sedation Time:  34 minutes The patient was continuously monitored during the procedure by the interventional radiology nurse under my direct supervision. FLUOROSCOPY TIME:  6 seconds, 1 mGy COMPLICATIONS: None immediate. PROCEDURE: The procedure, risks, benefits, and alternatives were explained to the patient. Questions regarding the procedure were encouraged and answered. The patient understands and consents to the procedure. Patient was placed supine on the interventional table. Ultrasound confirmed a patent right internal jugular vein. The right chest and neck were cleaned with a skin antiseptic and a sterile drape was placed. Maximal barrier sterile technique was utilized including caps, mask, sterile gowns, sterile gloves, sterile drape,  hand hygiene and skin antiseptic. The right neck was anesthetized with 1% lidocaine. Small incision was made in the right neck with a blade. Micropuncture set was placed in the right internal jugular vein with ultrasound guidance. The micropuncture wire was used for measurement purposes. The right chest was anesthetized with 1% lidocaine with epinephrine. #15 blade was used to make an incision and a subcutaneous port pocket was formed. [place] was assembled. Subcutaneous tunnel was formed with a stiff tunneling device. The port catheter was brought through the subcutaneous tunnel. The port was placed in the subcutaneous pocket. The micropuncture set was exchanged for a peel-away sheath. The catheter was placed through the peel-away sheath and the tip was positioned at the superior cavoatrial junction. Catheter placement was confirmed with fluoroscopy. The port was accessed and flushed with heparinized saline. The port pocket was closed using two layers of absorbable sutures and Dermabond. The vein skin site was closed using a single layer of absorbable suture and Dermabond. Sterile dressings were applied. Patient tolerated the procedure well without an immediate complication. Ultrasound and fluoroscopic images were taken and saved for this procedure. IMPRESSION: Placement of a subcutaneous port device. Electronically Signed   By: Markus Daft M.D.   On: 04/16/2016 14:42   Dg Abd Acute W/chest  Result Date: 05/07/2016 CLINICAL DATA:  Generalized abdominal pain for 3 days. Rectal bleeding. History of lung and bladder cancer. EXAM: DG ABDOMEN ACUTE W/ 1V CHEST COMPARISON:  PET-CT 04/21/2016.  Chest radiographs 03/31/2016. FINDINGS: A right jugular Port-A-Cath terminates over the lower SVC. The cardiac silhouette is normal in size. Aortic atherosclerosis is noted. Surgical clips are present in the right lower neck. Confluent left upper lobe opacity corresponds to the known  mass and associated left upper  lobe collapse. Elsewhere, the lungs are hyperinflated with evidence of emphysema. Small nodules are scattered throughout both lungs as seen on the recent PET-CT. There is no evidence of new airspace consolidation, edema, sizable pleural effusion, or pneumothorax. There is no evidence of intraperitoneal free air. Gas is present in nondilated loops of bowel without evidence of obstruction. Surgical clips are present in the central abdomen and right inguinal region. Atherosclerotic vascular calcification is noted. Multilevel disc degeneration and mild levoscoliosis are present in the lumbar spine. IMPRESSION: 1. Left upper lobe mass and scattered bilateral lung nodules consistent with known malignancy. 2. No evidence of acute airspace disease. 3. Nonobstructed bowel gas pattern. 4. Aortic atherosclerosis. Electronically Signed   By: Logan Bores M.D.   On: 05/07/2016 20:41   Ir Fluoro Guide Port Insertion Right  Result Date: 04/16/2016 INDICATION: Primary left lung cancer. EXAM: FLUOROSCOPIC AND ULTRASOUND GUIDED PLACEMENT OF A SUBCUTANEOUS PORT COMPARISON:  None. MEDICATIONS: Vancomycin 1 g; The antibiotic was administered within an appropriate time interval prior to skin puncture. ANESTHESIA/SEDATION: Versed 1.5 mg IV; Fentanyl 75 mcg IV; Moderate Sedation Time:  34 minutes The patient was continuously monitored during the procedure by the interventional radiology nurse under my direct supervision. FLUOROSCOPY TIME:  6 seconds, 1 mGy COMPLICATIONS: None immediate. PROCEDURE: The procedure, risks, benefits, and alternatives were explained to the patient. Questions regarding the procedure were encouraged and answered. The patient understands and consents to the procedure. Patient was placed supine on the interventional table. Ultrasound confirmed a patent right internal jugular vein. The right chest and neck were cleaned with a skin antiseptic and a sterile drape was placed. Maximal barrier sterile technique was  utilized including caps, mask, sterile gowns, sterile gloves, sterile drape, hand hygiene and skin antiseptic. The right neck was anesthetized with 1% lidocaine. Small incision was made in the right neck with a blade. Micropuncture set was placed in the right internal jugular vein with ultrasound guidance. The micropuncture wire was used for measurement purposes. The right chest was anesthetized with 1% lidocaine with epinephrine. #15 blade was used to make an incision and a subcutaneous port pocket was formed. [place] was assembled. Subcutaneous tunnel was formed with a stiff tunneling device. The port catheter was brought through the subcutaneous tunnel. The port was placed in the subcutaneous pocket. The micropuncture set was exchanged for a peel-away sheath. The catheter was placed through the peel-away sheath and the tip was positioned at the superior cavoatrial junction. Catheter placement was confirmed with fluoroscopy. The port was accessed and flushed with heparinized saline. The port pocket was closed using two layers of absorbable sutures and Dermabond. The vein skin site was closed using a single layer of absorbable suture and Dermabond. Sterile dressings were applied. Patient tolerated the procedure well without an immediate complication. Ultrasound and fluoroscopic images were taken and saved for this procedure. IMPRESSION: Placement of a subcutaneous port device. Electronically Signed   By: Markus Daft M.D.   On: 04/16/2016 14:42  [4 week]   Impression: 76 y/o female with h/o Stage IVA squamous cell carcinoma of the left lung with hypermetabolic contralateral pulmonary lesions on palliative systemic chemotherapy started 04/24/16 who presents with GI bleeding in the setting of profound thrombocytopenia and daily aspirin . Initially with ?black stool followed by maroon and clots.  Patient has had chronic vague issues of anorexia, weight loss, early satiety, dysphagia prior to cancer  diagnosis. Given history, with consider upper GI  source but cannot exclude small bowel or proximal colon.   Plan: 1. Consider upper endoscopy today if platelet count permits. Labs are pending.  2. Attending PPI.  We would like to thank you for the opportunity to participate in the care of NAKYA WEYAND.  Laureen Ochs. Bernarda Caffey [facility] [phone] 1/25/20189:16 AM  Addendum: Platelet count over 50,000 post transfusion of platelets. Patient had black/bloody stool.  EGD today.  Laureen Ochs. Bernarda Caffey [facility] [phone] 1/25/201810:40 AM     LOS: 1 day

## 2016-05-08 NOTE — Progress Notes (Addendum)
PROGRESS NOTE    Michelle Mendoza  PXT:[id number] DOB: Jul 20, 1940 DOA: 05/07/2016 PCP: Sallee Lange, MD    Brief Narrative:  76 year old female with history of lung cancer, currently undergoing chemotherapy presented with black colored stools. She was noted to be hypotensive in the emergency room. She was significant thrombocytopenia related to chemotherapy as well has having acute blood loss anemia. She was admitted for further management of GI bleed.   Assessment & Plan:   Principal Problem:   Upper GI bleed Active Problems:   Hyperlipemia   Renal insufficiency   Acute renal failure (HCC)   Squamous cell carcinoma lung, left (HCC)   Anemia of chronic disease   Protein-calorie malnutrition, severe   1. GI bleeding. Likely upper GI bleed. Currently on Protonix infusion. Patient is nothing by mouth. Gastroenterology has been consulted.  2. Acute blood loss anemia. Patient has been transfused 2 units PRBCs overnight. Follow-up hemoglobin has responded appropriately. Continue to follow closely.  3. Thrombocytopenia. Likely related to recent chemotherapy received last week. She received an infusion of platelets overnight. Follow platelets have improved. We'll continue to monitor.  4. Acute kidney injury. Likely related to volume depletion from bleeding. Continue volume resuscitation and repeat in a.m. Elevated BUN is likely related to upper GI bleed.  5. Squamous cell carcinoma of the lung . continue to follow with oncology.  DVT prophylaxis: scds Code Status: DNR Family Communication: discussed with daughter at bedside Disposition Plan: discharge home once improved   Consultants:   Gastroenterology  Procedures:      Antimicrobials:    Subjective: No further bowel movements since admission  Objective: Vitals:   05/08/16 0500 05/08/16 0515 05/08/16 0728 05/08/16 0745  BP:    (!) 150/83  Pulse:   77 80  Resp:   19 (!) 22  Temp:  98.2 F (36.8 C) 98.6 F (37  C) 98.6 F (37 C)  TempSrc:   Oral Oral  SpO2:   97% 97%  Weight: 44.3 kg (97 lb 10.6 oz)     Height:        Intake/Output Summary (Last 24 hours) at 05/08/16 1031 Last data filed at 05/08/16 0745  Gross per 24 hour  Intake          1638.33 ml  Output                0 ml  Net          1638.33 ml   Filed Weights   05/07/16 2230 05/08/16 0500  Weight: 41.3 kg (91 lb 0.8 oz) 44.3 kg (97 lb 10.6 oz)    Examination:  General exam: Appears calm and comfortable  Respiratory system: Clear to auscultation. Respiratory effort normal. Cardiovascular system: S1 & S2 heard, RRR. No JVD, murmurs, rubs, gallops or clicks. No pedal edema. Gastrointestinal system: Abdomen is nondistended, soft and nontender. No organomegaly or masses felt. Normal bowel sounds heard. Central nervous system: Alert and oriented. No focal neurological deficits. Extremities: Symmetric 5 x 5 power. Skin: No rashes, lesions or ulcers Psychiatry: Judgement and insight appear normal. Mood & affect appropriate.     Data Reviewed: I have personally reviewed following labs and imaging studies  CBC:  Recent Labs Lab 05/01/16 1203 05/07/16 1947 05/07/16 2233 05/08/16 0919  WBC 5.8 3.6*  --  3.1*  NEUTROABS 4.7 2.6  --   --   HGB 8.3* 9.1* 7.1* 9.2*  HCT 27.1* 28.2* 21.5* 27.0*  MCV 84.7 82.2  --  82.3  PLT 144* 28*  --  57*   Basic Metabolic Panel:  Recent Labs Lab 05/01/16 1203 05/07/16 1947  NA 133* 131*  K 4.8 4.4  CL 102 99*  CO2 20* 20*  GLUCOSE 124* 179*  BUN 49* 75*  CREATININE 1.21* 1.60*  CALCIUM 9.9 9.3   GFR: Estimated Creatinine Clearance: 21.9 mL/min (by C-G formula based on SCr of 1.6 mg/dL (H)). Liver Function Tests:  Recent Labs Lab 05/01/16 1203 05/07/16 1947  AST 58* 70*  ALT 16 19  ALKPHOS 56 81  BILITOT 0.3 0.5  PROT 7.5 6.7  ALBUMIN 2.7* 2.7*   No results for input(s): LIPASE, AMYLASE in the last 168 hours. No results for input(s): AMMONIA in the last 168  hours. Coagulation Profile:  Recent Labs Lab 05/07/16 1947  INR 1.30   Cardiac Enzymes:  Recent Labs Lab 05/07/16 1947  TROPONINI 0.07*   BNP (last 3 results) No results for input(s): PROBNP in the last 8760 hours. HbA1C: No results for input(s): HGBA1C in the last 72 hours. CBG: No results for input(s): GLUCAP in the last 168 hours. Lipid Profile: No results for input(s): CHOL, HDL, LDLCALC, TRIG, CHOLHDL, LDLDIRECT in the last 72 hours. Thyroid Function Tests: No results for input(s): TSH, T4TOTAL, FREET4, T3FREE, THYROIDAB in the last 72 hours. Anemia Panel: No results for input(s): VITAMINB12, FOLATE, FERRITIN, TIBC, IRON, RETICCTPCT in the last 72 hours. Sepsis Labs:  Recent Labs Lab 05/07/16 1958 05/07/16 2300  LATICACIDVEN 2.97* 1.4    Recent Results (from the past 240 hour(s))  MRSA PCR Screening     Status: None   Collection Time: 05/07/16 10:31 PM  Result Value Ref Range Status   MRSA by PCR NEGATIVE NEGATIVE Final    Comment:        The GeneXpert MRSA Assay (FDA approved for NASAL specimens only), is one component of a comprehensive MRSA colonization surveillance program. It is not intended to diagnose MRSA infection nor to guide or monitor treatment for MRSA infections.          Radiology Studies: Dg Abd Acute W/chest  Result Date: 05/07/2016 CLINICAL DATA:  Generalized abdominal pain for 3 days. Rectal bleeding. History of lung and bladder cancer. EXAM: DG ABDOMEN ACUTE W/ 1V CHEST COMPARISON:  PET-CT 04/21/2016.  Chest radiographs 03/31/2016. FINDINGS: A right jugular Port-A-Cath terminates over the lower SVC. The cardiac silhouette is normal in size. Aortic atherosclerosis is noted. Surgical clips are present in the right lower neck. Confluent left upper lobe opacity corresponds to the known mass and associated left upper lobe collapse. Elsewhere, the lungs are hyperinflated with evidence of emphysema. Small nodules are scattered throughout  both lungs as seen on the recent PET-CT. There is no evidence of new airspace consolidation, edema, sizable pleural effusion, or pneumothorax. There is no evidence of intraperitoneal free air. Gas is present in nondilated loops of bowel without evidence of obstruction. Surgical clips are present in the central abdomen and right inguinal region. Atherosclerotic vascular calcification is noted. Multilevel disc degeneration and mild levoscoliosis are present in the lumbar spine. IMPRESSION: 1. Left upper lobe mass and scattered bilateral lung nodules consistent with known malignancy. 2. No evidence of acute airspace disease. 3. Nonobstructed bowel gas pattern. 4. Aortic atherosclerosis. Electronically Signed   By: Logan Bores M.D.   On: 05/07/2016 20:41        Scheduled Meds: . sodium chloride   Intravenous Once  . pravastatin  20 mg Oral q1800   Continuous Infusions: .  dextrose 5 % and 0.9% NaCl 100 mL/hr at 05/08/16 0840  . pantoprozole (PROTONIX) infusion 8 mg/hr (05/08/16 0723)     LOS: 1 day    Time spent: 70mns    Marquice Uddin, MD [facility] Pager [phone] If 7PM-7AM, please contact night-coverage [email or web address] Password [email or web address]1/25/2018, 10:31 AM

## 2016-05-08 NOTE — Progress Notes (Signed)
Initial Nutrition Assessment  DOCUMENTATION CODES:  Severe malnutrition in context of chronic illness, Underweight   Pt meets criteria for SEVERE MALNUTRITION in the context of Chronic Illness as evidenced by Severe loss of Muscle/fat and an estimated PO intake that has met </= 75% of needs for >/= 1 month.  INTERVENTION:  Continue Ensure Enlive po BID, each supplement provides 350 kcal and 20 grams of protein  Chocolate Whole milk with trays  NUTRITION DIAGNOSIS:  Increased nutrient needs related to catabolic illness, cancer and cancer related treatments as evidenced by  Apparent loss of 6 lbs (6% of bw) in the last 3 weeks.   GOAL:  Patient will meet greater than or equal to 90% of their needs  MONITOR:  PO intake, Supplement acceptance, Diet advancement, Labs, I & O's  REASON FOR ASSESSMENT:  Malnutrition Screening Tool    ASSESSMENT:  76 y/o female PMHx HTN, HLD, SCC of lung undergoing chemotherapy. Presented to ED with black stool.Admitted for evaluation of GIB.  Work up also   showed elevated BUN/Creat.    Pt is being followed by this RD outpatient at the [facility]; last seen 2 weeks ago.   Today, she reports she has been eating very little at home. She said her last chemotherapy session "made me sick". It caused nausea, 1 episode of vomiting and severe fatigue.   She apparently is eating only 2 very small meals a day. She tries to drink 2 chocolate Boost supplements a day as well. She has no appetite at all. She had been placed on Megace a few weeks back, but unfortunately, she says she hasnt been taking it routinely because of the poor taste. She now only takes it "sometimes".  She says her constipation is well controlled right now.   At this time, pt does not have much interest in food. She had never gotten around to trying the chocolate Ensure as an outpatient. Can order while admitted. She was acceptable to chocolate milk at meals. The only food she said she might eat was  Grilled cheese.   RD urged her to start taking the megace routinely again as this should help her appetite. She needs to eat if she wishes to improve at all. She says "maybe this will just go away" (in reference to her loss of appetite). Does not seem to understand the urgency of her minimal PO intake.  NFPE: Severe upper body muscle/fat wasting.   Labs: BUN/Creat elevated, H/H:9.2/27.0, Albumin: 2.7 Medications: ppi, D5 in NS @ 100 ml/hr= 17 kcals/hr.    Recent Labs Lab 05/07/16 1947  NA 131*  K 4.4  CL 99*  CO2 20*  BUN 75*  CREATININE 1.60*  CALCIUM 9.3  GLUCOSE 179*   Diet Order:  Diet clear liquid Room service appropriate? Yes; Fluid consistency: Thin  Skin:  Reviewed, no issues  Last BM:  1/25-Bloody, mushy  Height:  Ht Readings from Last 1 Encounters:  05/07/16 5' 2.5" (1.588 m)   Weight:  Wt Readings from Last 1 Encounters:  05/08/16 97 lb 10.6 oz (44.3 kg)   Wt Readings from Last 10 Encounters:  05/08/16 97 lb 10.6 oz (44.3 kg)  05/01/16 91 lb 14.4 oz (41.7 kg)  04/24/16 93 lb 9.6 oz (42.5 kg)  04/21/16 94 lb (42.6 kg)  04/17/16 97 lb (44 kg)  04/16/16 97 lb 1 oz (44 kg)  04/11/16 95 lb 6.4 oz (43.3 kg)  03/31/16 90 lb (40.8 kg)  01/01/16 107 lb (48.5 kg)  07/03/15 113 lb 3.2 oz (51.3 kg)  Admit wt: 91 lbs  Ideal Body Weight:  51.14 kg  BMI:  Body mass index is 17.58 kg/m.  Estimated Nutritional Needs:  Kcal:  1450-1650 kcals (35-40 g/kg bw) Protein:  65-75 g Pro (1.5-1.8 g/kg bw) Fluid:  >1.3 L (30 mls/kg)  EDUCATION NEEDS:  No education needs identified at this time  Nathan Franks RD, LDN, CNSC Clinical Nutrition Pager: [phone] 05/08/2016 3:12 PM  

## 2016-05-09 DIAGNOSIS — K922 Gastrointestinal hemorrhage, unspecified: Secondary | ICD-10-CM

## 2016-05-09 DIAGNOSIS — D638 Anemia in other chronic diseases classified elsewhere: Secondary | ICD-10-CM

## 2016-05-09 DIAGNOSIS — D62 Acute posthemorrhagic anemia: Secondary | ICD-10-CM

## 2016-05-09 LAB — BASIC METABOLIC PANEL
Anion gap: 7 (ref 5–15)
BUN: 47 mg/dL — AB (ref 6–20)
CHLORIDE: 111 mmol/L (ref 101–111)
CO2: 17 mmol/L — AB (ref 22–32)
CREATININE: 1.11 mg/dL — AB (ref 0.44–1.00)
Calcium: 7.8 mg/dL — ABNORMAL LOW (ref 8.9–10.3)
GFR calc non Af Amer: 48 mL/min — ABNORMAL LOW (ref >60)
GFR, EST AFRICAN AMERICAN: 56 mL/min — AB (ref >60)
GLUCOSE: 105 mg/dL — AB (ref 65–99)
Potassium: 4.1 mmol/L (ref 3.5–5.1)
Sodium: 135 mmol/L (ref 135–145)

## 2016-05-09 LAB — HEMOGLOBIN: HEMOGLOBIN: 8.8 g/dL — AB (ref 12.0–15.0)

## 2016-05-09 LAB — TYPE AND SCREEN
ABO/RH(D): O NEG
ANTIBODY SCREEN: NEGATIVE
UNIT DIVISION: 0
Unit division: 0

## 2016-05-09 LAB — HEMATOCRIT: HCT: 26.3 % — ABNORMAL LOW (ref 36.0–46.0)

## 2016-05-09 LAB — CBC
HEMATOCRIT: 25.3 % — AB (ref 36.0–46.0)
HEMOGLOBIN: 8.6 g/dL — AB (ref 12.0–15.0)
MCH: 28.2 pg (ref 26.0–34.0)
MCHC: 34 g/dL (ref 30.0–36.0)
MCV: 83 fL (ref 78.0–100.0)
Platelets: 21 10*3/uL — CL (ref 150–400)
RBC: 3.05 MIL/uL — ABNORMAL LOW (ref 3.87–5.11)
RDW: 16.7 % — ABNORMAL HIGH (ref 11.5–15.5)
WBC: 3.4 10*3/uL — ABNORMAL LOW (ref 4.0–10.5)

## 2016-05-09 MED ORDER — SODIUM CHLORIDE 0.9 % IV SOLN: Freq: Once | INTRAVENOUS | Status: DC

## 2016-05-09 MED ORDER — DEXTROSE-NACL 5-0.45 % IV SOLN
INTRAVENOUS | Status: DC
  Administered 2016-05-09: 13:00:00 via INTRAVENOUS

## 2016-05-09 NOTE — Progress Notes (Signed)
Subjective: Doing well. Denies abdominal pain, N/V. Has some dark red stool this morning. No other GI complaints.  Objective: Vital signs in last 24 hours: Temp:  [97.6 F (36.4 C)-98.8 F (37.1 C)] 97.8 F (36.6 C) (01/26 1235) Pulse Rate:  [67-107] 99 (01/26 1235) Resp:  [0-30] 18 (01/26 1235) BP: (117-155)/(65-80) 136/70 (01/26 1235) SpO2:  [85 %-100 %] 100 % (01/26 1235) Weight:  [96 lb 9 oz (43.8 kg)] 96 lb 9 oz (43.8 kg) (01/26 0447) Last BM Date: 05/09/16 General:   Alert and oriented, pleasant Eyes:  No icterus, sclera clear. Conjuctiva pink.  Heart:  S1, S2 present, no murmurs noted.  Lungs: Clear to auscultation bilaterally, without wheezing, rales, or rhonchi.  Abdomen:  Bowel sounds present, soft, non-tender, non-distended. No HSM or hernias noted. No rebound or guarding. No masses appreciated  Msk:  Symmetrical without gross deformities. Neurologic:  Alert and  oriented x4;  grossly normal neurologically. Psych:  Alert and cooperative. Normal mood and affect.  Intake/Output from previous day: 01/25 0701 - 01/26 0700 In: 3193.3 [I.V.:2858.3; Blood:335] Out: 400 [Urine:400] Intake/Output this shift: Total I/O In: 783 [P.O.:240; I.V.:250; Blood:293] Out: 450 [Urine:450]  Lab Results:  Recent Labs  05/07/16 1947  05/08/16 0919  05/08/16 2117 05/09/16 0314 05/09/16 1014  WBC 3.6*  --  3.1*  --   --  3.4*  --   HGB 9.1*  < > 9.2*  < > 8.6* 8.6* 8.8*  HCT 28.2*  < > 27.0*  < > 25.1* 25.3* 26.3*  PLT 28*  --  57*  --   --  21*  --   < > = values in this interval not displayed. BMET  Recent Labs  05/07/16 1947 05/09/16 0314  NA 131* 135  K 4.4 4.1  CL 99* 111  CO2 20* 17*  GLUCOSE 179* 105*  BUN 75* 47*  CREATININE 1.60* 1.11*  CALCIUM 9.3 7.8*   LFT  Recent Labs  05/07/16 1947  PROT 6.7  ALBUMIN 2.7*  AST 70*  ALT 19  ALKPHOS 81  BILITOT 0.5   PT/INR  Recent Labs  05/07/16 1947  LABPROT 16.3*  INR 1.30   Hepatitis Panel No  results for input(s): HEPBSAG, HCVAB, HEPAIGM, HEPBIGM in the last 72 hours.   Studies/Results: Dg Abd Acute W/chest  Result Date: 05/07/2016 CLINICAL DATA:  Generalized abdominal pain for 3 days. Rectal bleeding. History of lung and bladder cancer. EXAM: DG ABDOMEN ACUTE W/ 1V CHEST COMPARISON:  PET-CT 04/21/2016.  Chest radiographs 03/31/2016. FINDINGS: A right jugular Port-A-Cath terminates over the lower SVC. The cardiac silhouette is normal in size. Aortic atherosclerosis is noted. Surgical clips are present in the right lower neck. Confluent left upper lobe opacity corresponds to the known mass and associated left upper lobe collapse. Elsewhere, the lungs are hyperinflated with evidence of emphysema. Small nodules are scattered throughout both lungs as seen on the recent PET-CT. There is no evidence of new airspace consolidation, edema, sizable pleural effusion, or pneumothorax. There is no evidence of intraperitoneal free air. Gas is present in nondilated loops of bowel without evidence of obstruction. Surgical clips are present in the central abdomen and right inguinal region. Atherosclerotic vascular calcification is noted. Multilevel disc degeneration and mild levoscoliosis are present in the lumbar spine. IMPRESSION: 1. Left upper lobe mass and scattered bilateral lung nodules consistent with known malignancy. 2. No evidence of acute airspace disease. 3. Nonobstructed bowel gas pattern. 4. Aortic atherosclerosis. Electronically Signed  By: Logan Bores M.D.   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 Initially with ?black stool followed by maroon and clots.  Patient has had chronic vague issues of anorexia, weight loss, early  satiety, dysphagia prior to cancer diagnosis. She was arranged for EGD.  EGD completed 05/09/2015 which found normal esophagus, a few erosions in the entire stomach, multiple localized erosions without bleeding in the duodenal bulb. A single oozing cratered duodenal ulcer with adherent clot found in the duodenal bulb (7 mm) and a second similar sized ulcer also found with a clean base. 2 hemostatic clips placed successfully as well as 3 mL of epinephrine solution. No bleeding at the end of the procedure. She was advanced to a clear liquid diet, recommend IV PPI infusion for total of 72 hours and no future MRI until clips gone.  Today her diet has been advanced per her request to regular diet. PPI infusion continues (started 05/07/16 2037 and set to completed 72 hours on 05/10/16 at 2037. Clinically improved, tolerating diet after EGD with clip placement. Dark red stools likely old blood clearing the system as her hgb has been stable (8.8 today, low-9 to mid-8 over the past 72 hours).  Plan: 1. Monitor for any new bleeding 2. Continue PPI infusion for 72 hours total 3. When d/c, home on bid Protonix 4. Plan outpatient follow-up in our office in 4-6 weeks 5. Will likely need surveillance EGD in about 3 months.   Thank you for allowing [place] to participate in the care of Michelle Mendoza  Jaedan Schuman, DNP, AGNP-C Adult & Gerontological Nurse Practitioner [facility]     LOS: 2 days    05/09/2016, 1:34 PM

## 2016-05-09 NOTE — Progress Notes (Signed)
PROGRESS NOTE    Michelle Mendoza  GBT:[id number] DOB: Nov 25, 1940 DOA: 05/07/2016 PCP: Sallee Lange, MD    Brief Narrative:  76 year old female with history of lung cancer, currently undergoing chemotherapy presented with black colored stools. She was noted to be hypotensive in the emergency room. She was significant thrombocytopenia related to chemotherapy as well has having acute blood loss anemia. She was admitted for further management of GI bleed.   Assessment & Plan:   Principal Problem:   Upper GI bleed Active Problems:   Hyperlipemia   Renal insufficiency   Acute renal failure (HCC)   Squamous cell carcinoma lung, left (HCC)   Anemia of chronic disease   Protein-calorie malnutrition, severe   Thrombocytopenia (HCC)   1. GI bleeding. EGD showed oozing duodenal ulcer that was clipped. Currently on Protonix infusion which is set to complete tomorrow. Advance diet. Gastroenterology has been consulted.  2. Acute blood loss anemia. Patient has been transfused 2 units PRBCs overnight. Follow-up hemoglobin has responded appropriately. Continue to follow closely.  3. Thrombocytopenia. Likely related to recent chemotherapy received last week. She received an infusion of platelets on admission. Follow up platelet count improved, but has since dropped back down. Further platelet transfusion ordered by night team. We'll continue to monitor.  4. Acute kidney injury. Likely related to volume depletion from bleeding. Improving with hydration. Continue volume resuscitation and repeat in a.m. Elevated BUN is likely related to upper GI bleed.  5. Squamous cell carcinoma of the lung . continue to follow with oncology.  DVT prophylaxis: scds Code Status: DNR Family Communication: discussed with son at bedside Disposition Plan: discharge home once improved   Consultants:   Gastroenterology  Procedures:  EGD: - Normal esophagus.                           - Erosive gastropathy.                      - Duodenal erosions without bleeding.                           - Oozing duodenal ulcer with adherent clot. Clips                            were placed. Injected.                           - No specimens collected.   Antimicrobials:    Subjective: Had one bowel movement last night and one this morning. She did not see what they looked like. No abdominal pain  Objective: Vitals:   05/09/16 0945 05/09/16 1000 05/09/16 1007 05/09/16 1015  BP:   (!) 142/76 (!) 142/76  Pulse: 81 75 76 77  Resp: 18 (!) 21 (!) 22 16  Temp:   97.7 F (36.5 C)   TempSrc:   Oral   SpO2: 99% 100% 99% 99%  Weight:      Height:        Intake/Output Summary (Last 24 hours) at 05/09/16 1103 Last data filed at 05/09/16 1007  Gross per 24 hour  Intake          3098.33 ml  Output              850 ml  Net  2248.33 ml   Filed Weights   05/07/16 2230 05/08/16 0500 05/09/16 0447  Weight: 41.3 kg (91 lb 0.8 oz) 44.3 kg (97 lb 10.6 oz) 43.8 kg (96 lb 9 oz)    Examination:  General exam: Appears calm and comfortable  Respiratory system: Clear to auscultation. Respiratory effort normal. Cardiovascular system: S1 & S2 heard, RRR. No JVD, murmurs, rubs, gallops or clicks. No pedal edema. Gastrointestinal system: Abdomen is nondistended, soft and nontender. No organomegaly or masses felt. Normal bowel sounds heard. Central nervous system: Alert and oriented. No focal neurological deficits. Extremities: Symmetric 5 x 5 power. Skin: No rashes, lesions or ulcers Psychiatry: Judgement and insight appear normal. Mood & affect appropriate.     Data Reviewed: I have personally reviewed following labs and imaging studies  CBC:  Recent Labs Lab 05/07/16 1947  05/08/16 0919 05/08/16 1454 05/08/16 2117 05/09/16 0314 05/09/16 1014  WBC 3.6*  --  3.1*  --   --  3.4*  --   NEUTROABS 2.6  --   --   --   --   --   --   HGB 9.1*  < > 9.2* 8.5* 8.6* 8.6* 8.8*  HCT 28.2*  < > 27.0*  25.1* 25.1* 25.3* 26.3*  MCV 82.2  --  82.3  --   --  83.0  --   PLT 28*  --  57*  --   --  21*  --   < > = values in this interval not displayed. Basic Metabolic Panel:  Recent Labs Lab 05/07/16 1947 05/09/16 0314  NA 131* 135  K 4.4 4.1  CL 99* 111  CO2 20* 17*  GLUCOSE 179* 105*  BUN 75* 47*  CREATININE 1.60* 1.11*  CALCIUM 9.3 7.8*   GFR: Estimated Creatinine Clearance: 31.2 mL/min (by C-G formula based on SCr of 1.11 mg/dL (H)). Liver Function Tests:  Recent Labs Lab 05/07/16 1947  AST 70*  ALT 19  ALKPHOS 81  BILITOT 0.5  PROT 6.7  ALBUMIN 2.7*   No results for input(s): LIPASE, AMYLASE in the last 168 hours. No results for input(s): AMMONIA in the last 168 hours. Coagulation Profile:  Recent Labs Lab 05/07/16 1947  INR 1.30   Cardiac Enzymes:  Recent Labs Lab 05/07/16 1947  TROPONINI 0.07*   BNP (last 3 results) No results for input(s): PROBNP in the last 8760 hours. HbA1C: No results for input(s): HGBA1C in the last 72 hours. CBG: No results for input(s): GLUCAP in the last 168 hours. Lipid Profile: No results for input(s): CHOL, HDL, LDLCALC, TRIG, CHOLHDL, LDLDIRECT in the last 72 hours. Thyroid Function Tests: No results for input(s): TSH, T4TOTAL, FREET4, T3FREE, THYROIDAB in the last 72 hours. Anemia Panel: No results for input(s): VITAMINB12, FOLATE, FERRITIN, TIBC, IRON, RETICCTPCT in the last 72 hours. Sepsis Labs:  Recent Labs Lab 05/07/16 1958 05/07/16 2300  LATICACIDVEN 2.97* 1.4    Recent Results (from the past 240 hour(s))  MRSA PCR Screening     Status: None   Collection Time: 05/07/16 10:31 PM  Result Value Ref Range Status   MRSA by PCR NEGATIVE NEGATIVE Final    Comment:        The GeneXpert MRSA Assay (FDA approved for NASAL specimens only), is one component of a comprehensive MRSA colonization surveillance program. It is not intended to diagnose MRSA infection nor to guide or monitor treatment for MRSA  infections.          Radiology Studies: Dg Abd  Acute W/chest  Result Date: 05/07/2016 CLINICAL DATA:  Generalized abdominal pain for 3 days. Rectal bleeding. History of lung and bladder cancer. EXAM: DG ABDOMEN ACUTE W/ 1V CHEST COMPARISON:  PET-CT 04/21/2016.  Chest radiographs 03/31/2016. FINDINGS: A right jugular Port-A-Cath terminates over the lower SVC. The cardiac silhouette is normal in size. Aortic atherosclerosis is noted. Surgical clips are present in the right lower neck. Confluent left upper lobe opacity corresponds to the known mass and associated left upper lobe collapse. Elsewhere, the lungs are hyperinflated with evidence of emphysema. Small nodules are scattered throughout both lungs as seen on the recent PET-CT. There is no evidence of new airspace consolidation, edema, sizable pleural effusion, or pneumothorax. There is no evidence of intraperitoneal free air. Gas is present in nondilated loops of bowel without evidence of obstruction. Surgical clips are present in the central abdomen and right inguinal region. Atherosclerotic vascular calcification is noted. Multilevel disc degeneration and mild levoscoliosis are present in the lumbar spine. IMPRESSION: 1. Left upper lobe mass and scattered bilateral lung nodules consistent with known malignancy. 2. No evidence of acute airspace disease. 3. Nonobstructed bowel gas pattern. 4. Aortic atherosclerosis. Electronically Signed   By: Logan Bores M.D.   On: 05/07/2016 20:41        Scheduled Meds: . sodium chloride   Intravenous Once  . sodium chloride   Intravenous Once  . feeding supplement (ENSURE ENLIVE)  237 mL Oral BID BM  . pravastatin  20 mg Oral q1800   Continuous Infusions: . dextrose 5 % and 0.9% NaCl 100 mL/hr at 05/09/16 0048  . pantoprozole (PROTONIX) infusion 8 mg/hr (05/09/16 0748)     LOS: 2 days    Time spent: 45mns    MEMON,JEHANZEB, MD [facility] Pager [phone] If 7PM-7AM, please  contact night-coverage [email or web address] Password [email or web address] 05/09/2016, 11:03 AM

## 2016-05-10 ENCOUNTER — Encounter: Payer: Self-pay | Admitting: Internal Medicine

## 2016-05-10 LAB — CBC
HEMATOCRIT: 26.4 % — AB (ref 36.0–46.0)
Hemoglobin: 8.9 g/dL — ABNORMAL LOW (ref 12.0–15.0)
MCH: 27.6 pg (ref 26.0–34.0)
MCHC: 33.7 g/dL (ref 30.0–36.0)
MCV: 82 fL (ref 78.0–100.0)
Platelets: 160 10*3/uL (ref 150–400)
RBC: 3.22 MIL/uL — ABNORMAL LOW (ref 3.87–5.11)
RDW: 16.5 % — AB (ref 11.5–15.5)
WBC: 4.7 10*3/uL (ref 4.0–10.5)

## 2016-05-10 LAB — BASIC METABOLIC PANEL
Anion gap: 10 (ref 5–15)
BUN: 33 mg/dL — AB (ref 6–20)
CHLORIDE: 103 mmol/L (ref 101–111)
CO2: 19 mmol/L — AB (ref 22–32)
Calcium: 7.7 mg/dL — ABNORMAL LOW (ref 8.9–10.3)
Creatinine, Ser: 1.06 mg/dL — ABNORMAL HIGH (ref 0.44–1.00)
GFR calc Af Amer: 59 mL/min — ABNORMAL LOW (ref >60)
GFR calc non Af Amer: 51 mL/min — ABNORMAL LOW (ref >60)
GLUCOSE: 126 mg/dL — AB (ref 65–99)
POTASSIUM: 3.3 mmol/L — AB (ref 3.5–5.1)
SODIUM: 132 mmol/L — AB (ref 135–145)

## 2016-05-10 LAB — PREPARE PLATELET PHERESIS
Blood Product Expiration Date: 201801282359
Blood Product Expiration Date: 201801282359
ISSUE DATE / TIME: 201801260952
ISSUE DATE / TIME: 201801261415
Unit Type and Rh: 7300
Unit Type and Rh: 7300

## 2016-05-10 MED ORDER — POTASSIUM CHLORIDE CRYS ER 20 MEQ PO TBCR
40.0000 meq | EXTENDED_RELEASE_TABLET | Freq: Once | ORAL | Status: AC
  Administered 2016-05-10: 40 meq via ORAL
  Filled 2016-05-10: qty 2

## 2016-05-10 MED ORDER — PANTOPRAZOLE SODIUM 40 MG PO TBEC: 40.0000 mg | DELAYED_RELEASE_TABLET | Freq: Two times a day (BID) | ORAL | 2 refills | Status: AC

## 2016-05-10 NOTE — Discharge Summary (Signed)
Physician Discharge Summary  MANE CONSOLO HYQ:[id number] DOB: 10-May-1940 DOA: 05/07/2016  PCP: Sallee Lange, MD  Admit date: 05/07/2016 Discharge date: 05/10/2016  Admitted From: home Disposition:  home  Recommendations for Outpatient Follow-up:  1. Follow up with PCP in 1-2 weeks 2. Follow up with GI as scheduled  Home Health: Equipment/Devices:  Discharge Condition:stable CODE STATUS:DNR Diet recommendation: Heart Healthy  Brief/Interim Summary: 76 year old female with history of lung cancer, currently undergoing chemotherapy presented with black colored stools. She was noted to be hypotensive in the emergency room. She was significant thrombocytopenia related to chemotherapy as well has having acute blood loss anemia. She was admitted for further management of GI bleed.  Discharge Diagnoses:  Principal Problem:   Upper GI bleed Active Problems:   Hyperlipemia   Renal insufficiency   Acute renal failure (HCC)   Squamous cell carcinoma lung, left (HCC)   Anemia of chronic disease   Protein-calorie malnutrition, severe   Thrombocytopenia (HCC)   Acute GI bleeding   Acute blood loss anemia  1. GI bleeding. EGD showed oozing duodenal ulcer that was clipped. She was treated with protonix infusion for 72 hours. She was transitioned to twice a day Protonix. She is tolerating solid diet. She does not appear to have signs of active bleeding. She will follow up with GI as an outpatient  2. Acute blood loss anemia. Patient has been transfused 2 units PRBCs overnight. Follow-up hemoglobin has responded appropriately and has been stable.  3. Thrombocytopenia. Likely related to recent chemotherapy received last week. She received an infusion of platelets on admission. Follow up platelet count improved, but has since dropped back down. Further platelet transfusion ordered by night team. Follow up platelet count has been stable.  4. Acute kidney injury. Likely related to volume  depletion from bleeding. Improving with hydration. Elevated BUN is likely related to upper GI bleed.  5. Squamous cell carcinoma of the lung . continue to follow with oncology.  Discharge Instructions  Discharge Instructions    Diet - low sodium heart healthy    Complete by:  As directed    Increase activity slowly    Complete by:  As directed      Allergies as of 05/10/2016      Reactions   Penicillins Other (See Comments)   Childhood allergy. Has patient had a PCN reaction causing immediate rash, facial/tongue/throat swelling, SOB or lightheadedness with hypotension: unknown Has patient had a PCN reaction causing severe rash involving mucus membranes or skin necrosis: unknown Has patient had a PCN reaction that required hospitalization: unknown Has patient had a PCN reaction occurring within the last 10 years: unknown If all of the above answers are "NO", then may proceed with Cephalosporin use.   Sulfa Antibiotics Other (See Comments)   unknown      Medication List    STOP taking these medications   aspirin 81 MG tablet   aspirin EC 81 MG tablet   lisinopril-hydrochlorothiazide 20-12.5 MG tablet Commonly known as:  PRINZIDE,ZESTORETIC     TAKE these medications   acetaminophen 500 MG tablet Commonly known as:  TYLENOL Take 1,000 mg by mouth every 6 (six) hours as needed for moderate pain.   alendronate 70 MG tablet Commonly known as:  FOSAMAX Take 1 tablet (70 mg total) by mouth every 7 (seven) days. Take with a full glass of water on an empty stomach sitting upright.   atenolol 50 MG tablet Commonly known as:  TENORMIN TAKE (1) TABLET BY MOUTH  TWICE DAILY.   B-12 250 MCG Tabs Take 1 tablet by mouth daily.   calcium-vitamin D 500-200 MG-UNIT tablet Commonly known as:  OSCAL WITH D Take 1 tablet by mouth daily.   CARBOPLATIN IV Inject into the vein.   co-enzyme Q-10 30 MG capsule Take 100 mg by mouth daily.   GEMZAR IV Inject into the vein.    lidocaine-prilocaine cream Commonly known as:  EMLA Apply a quarter size amount to affected area 1 hour prior to coming to chemotherapy. Cover with plastic.   megestrol 400 MG/10ML suspension Commonly known as:  MEGACE Place 10 mLs (400 mg total) into feeding tube 2 (two) times daily. What changed:  how to take this   multivitamin-lutein Caps capsule Take 1 capsule by mouth daily.   ondansetron 8 MG tablet Commonly known as:  ZOFRAN Take 1 tablet (8 mg total) by mouth every 8 (eight) hours as needed for nausea or vomiting.   pantoprazole 40 MG tablet Commonly known as:  PROTONIX Take 1 tablet (40 mg total) by mouth 2 (two) times daily before a meal.   pravastatin 20 MG tablet Commonly known as:  PRAVACHOL Take 1 tablet (20 mg total) by mouth daily.   prochlorperazine 10 MG tablet Commonly known as:  COMPAZINE Take 1 tablet (10 mg total) by mouth every 6 (six) hours as needed for nausea or vomiting.   sennosides-docusate sodium 8.6-50 MG tablet Commonly known as:  SENOKOT-S Take 1 tablet by mouth daily.   traMADol 50 MG tablet Commonly known as:  ULTRAM Take 1-2 tablets (50-100 mg total) by mouth every 6 (six) hours as needed for moderate pain or severe pain.   zolpidem 5 MG tablet Commonly known as:  AMBIEN Take 1 tablet (5 mg total) by mouth at bedtime as needed for sleep.       Allergies  Allergen Reactions  . Penicillins Other (See Comments)    Childhood allergy. Has patient had a PCN reaction causing immediate rash, facial/tongue/throat swelling, SOB or lightheadedness with hypotension: unknown Has patient had a PCN reaction causing severe rash involving mucus membranes or skin necrosis: unknown Has patient had a PCN reaction that required hospitalization: unknown Has patient had a PCN reaction occurring within the last 10 years: unknown If all of the above answers are "NO", then may proceed with Cephalosporin use.   . Sulfa Antibiotics Other (See Comments)     unknown    Consultations:  GI   Procedures/Studies: Nm Pet Image Initial (pi) Skull Base To Thigh  Result Date: 04/21/2016 CLINICAL DATA:  Initial treatment strategy for left upper lobe lung mass, non-small cell lung cancer on biopsy 04/02/2016. EXAM: NUCLEAR MEDICINE PET SKULL BASE TO THIGH TECHNIQUE: 12.0 mCi F-18 FDG was injected intravenously. Full-ring PET imaging was performed from the skull base to thigh after the radiotracer. CT data was obtained and used for attenuation correction and anatomic localization. FASTING BLOOD GLUCOSE:  Value: 88 mg/dl COMPARISON:  Chest CT from 03/31/2016 FINDINGS: NECK Small hypermetabolic focus along the right posterior glottic level in the vicinity of the right arytenoid cartilage, no CT correlate, maximum SUV 6.3 Dense carotid atherosclerotic calcification. CHEST Approximately 7.7 by 5.0 cm dominant left upper lobe anterior mass with central necrosis and marginal hypermetabolic activity, maximum SUV 22.8. This occludes the left upper lobe bronchus and there is atelectasis of the rest of the left upper lobe. Considerable confluent and adjacent suprahilar and AP window adenopathy if, difficult to individually measure given the confluent and indistinctly  marginated nature, but with a maximum SUV of approximately 13.0. If a long subcarinal node measures 1.8 cm in short axis and has a maximum standard uptake value of 13.6. Hypermetabolic right hilar activity has a short axis diameter of 1.5 cm and a maximum SUV of 8.5. A 1.7 cm in short axis right axillary lymph node has a maximum standard uptake value of 4.2. Scattered bilateral pulmonary nodules are identified, most under 1 cm in size, these lesions appear to be hypermetabolic. An index subpleural nodule anteriorly in the right upper lobe measures 1.8 by 1.2 cm on image 94/ 3 and has a maximum standard uptake value of 16.9. Coronary, aortic arch, and branch vessel atherosclerotic vascular disease. ABDOMEN/PELVIS  Hypermetabolic activity in the left adrenal gland with only slight nodular thickening, maximum SUV 5.0 faintly accentuated right adrenal activity without CT correlate, maximum SUV 3.9. Background hepatic SUV approximately 2.8. Presumed physiologic activity in the cecum and ascending colon without CT correlate. Several focal areas of ureteral activity ascribed to excreted FDG. Suspected renal artery grafts bilaterally. Aortoiliac atherosclerotic vascular disease. Left kidney upper pole and lower pole complex cysts. 0.7 by 0.9 cm lesion in the right mid kidney laterally, probably a Bosniak category 2 cyst, no obvious accentuated activity in this vicinity although small renal lesions are difficult to characterize by PET-CT due to the high degree of underlying a renal background activity from FDG excretion. SKELETON No focal hypermetabolic activity to suggest skeletal metastasis. IMPRESSION: 1. Primary 7.7 by 5.0 cm left upper lobe mass occludes the left upper lobe bronchus and has a maximum SUV of 22.8. Scattered bilateral pulmonary nodules are hypermetabolic indicating metastatic disease. Considerable mediastinal and hilar pathologic adenopathy. Faintly activity in the adrenal glands could represent early metastatic disease to the adrenals. 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 2. There is a small hypermetabolic focus along the right posterior glottic level in the vicinity of the right arytenoid cartilage without CT correlate, maximum SUV 6.3. Given the lack of CT correlate this may simply be physiologic but is focally prominent and may merit attention on follow up studies. 3. Other imaging findings of potential clinical significance: Coronary, aortic arch, and branch vessel atherosclerotic vascular disease. Aortoiliac atherosclerotic vascular disease. Renal artery grafts. Bilateral renal cysts. Electronically Signed   By: Van Clines M.D.   On: 04/21/2016 13:07   Ir US Guide Vasc Access  Right  Result Date: 04/16/2016 INDICATION: Primary left lung cancer. EXAM: FLUOROSCOPIC AND ULTRASOUND GUIDED PLACEMENT OF A SUBCUTANEOUS PORT COMPARISON:  None. MEDICATIONS: Vancomycin 1 g; The antibiotic was administered within an appropriate time interval prior to skin puncture. ANESTHESIA/SEDATION: Versed 1.5 mg IV; Fentanyl 75 mcg IV; Moderate Sedation Time:  34 minutes The patient was continuously monitored during the procedure by the interventional radiology nurse under my direct supervision. FLUOROSCOPY TIME:  6 seconds, 1 mGy COMPLICATIONS: None immediate. PROCEDURE: The procedure, risks, benefits, and alternatives were explained to the patient. Questions regarding the procedure were encouraged and answered. The patient understands and consents to the procedure. Patient was placed supine on the interventional table. Ultrasound confirmed a patent right internal jugular vein. The right chest and neck were cleaned with a skin antiseptic and a sterile drape was placed. Maximal barrier sterile technique was utilized including caps, mask, sterile gowns, sterile gloves, sterile drape, hand hygiene and skin antiseptic. The right neck was anesthetized with 1% lidocaine. Small incision was made in the right neck with a blade. Micropuncture set was placed in the  right internal jugular vein with ultrasound guidance. The micropuncture wire was used for measurement purposes. The right chest was anesthetized with 1% lidocaine with epinephrine. #15 blade was used to make an incision and a subcutaneous port pocket was formed. [place] was assembled. Subcutaneous tunnel was formed with a stiff tunneling device. The port catheter was brought through the subcutaneous tunnel. The port was placed in the subcutaneous pocket. The micropuncture set was exchanged for a peel-away sheath. The catheter was placed through the peel-away sheath and the tip was positioned at the superior cavoatrial junction. Catheter placement  was confirmed with fluoroscopy. The port was accessed and flushed with heparinized saline. The port pocket was closed using two layers of absorbable sutures and Dermabond. The vein skin site was closed using a single layer of absorbable suture and Dermabond. Sterile dressings were applied. Patient tolerated the procedure well without an immediate complication. Ultrasound and fluoroscopic images were taken and saved for this procedure. IMPRESSION: Placement of a subcutaneous port device. Electronically Signed   By: Markus Daft M.D.   On: 04/16/2016 14:42   Dg Abd Acute W/chest  Result Date: 05/07/2016 CLINICAL DATA:  Generalized abdominal pain for 3 days. Rectal bleeding. History of lung and bladder cancer. EXAM: DG ABDOMEN ACUTE W/ 1V CHEST COMPARISON:  PET-CT 04/21/2016.  Chest radiographs 03/31/2016. FINDINGS: A right jugular Port-A-Cath terminates over the lower SVC. The cardiac silhouette is normal in size. Aortic atherosclerosis is noted. Surgical clips are present in the right lower neck. Confluent left upper lobe opacity corresponds to the known mass and associated left upper lobe collapse. Elsewhere, the lungs are hyperinflated with evidence of emphysema. Small nodules are scattered throughout both lungs as seen on the recent PET-CT. There is no evidence of new airspace consolidation, edema, sizable pleural effusion, or pneumothorax. There is no evidence of intraperitoneal free air. Gas is present in nondilated loops of bowel without evidence of obstruction. Surgical clips are present in the central abdomen and right inguinal region. Atherosclerotic vascular calcification is noted. Multilevel disc degeneration and mild levoscoliosis are present in the lumbar spine. IMPRESSION: 1. Left upper lobe mass and scattered bilateral lung nodules consistent with known malignancy. 2. No evidence of acute airspace disease. 3. Nonobstructed bowel gas pattern. 4. Aortic atherosclerosis. Electronically Signed   By:  Logan Bores M.D.   On: 05/07/2016 20:41   Ir Fluoro Guide Port Insertion Right  Result Date: 04/16/2016 INDICATION: Primary left lung cancer. EXAM: FLUOROSCOPIC AND ULTRASOUND GUIDED PLACEMENT OF A SUBCUTANEOUS PORT COMPARISON:  None. MEDICATIONS: Vancomycin 1 g; The antibiotic was administered within an appropriate time interval prior to skin puncture. ANESTHESIA/SEDATION: Versed 1.5 mg IV; Fentanyl 75 mcg IV; Moderate Sedation Time:  34 minutes The patient was continuously monitored during the procedure by the interventional radiology nurse under my direct supervision. FLUOROSCOPY TIME:  6 seconds, 1 mGy COMPLICATIONS: None immediate. PROCEDURE: The procedure, risks, benefits, and alternatives were explained to the patient. Questions regarding the procedure were encouraged and answered. The patient understands and consents to the procedure. Patient was placed supine on the interventional table. Ultrasound confirmed a patent right internal jugular vein. The right chest and neck were cleaned with a skin antiseptic and a sterile drape was placed. Maximal barrier sterile technique was utilized including caps, mask, sterile gowns, sterile gloves, sterile drape, hand hygiene and skin antiseptic. The right neck was anesthetized with 1% lidocaine. Small incision was made in the right neck with a blade. Micropuncture set was placed in  the right internal jugular vein with ultrasound guidance. The micropuncture wire was used for measurement purposes. The right chest was anesthetized with 1% lidocaine with epinephrine. #15 blade was used to make an incision and a subcutaneous port pocket was formed. [place] was assembled. Subcutaneous tunnel was formed with a stiff tunneling device. The port catheter was brought through the subcutaneous tunnel. The port was placed in the subcutaneous pocket. The micropuncture set was exchanged for a peel-away sheath. The catheter was placed through the peel-away sheath and the  tip was positioned at the superior cavoatrial junction. Catheter placement was confirmed with fluoroscopy. The port was accessed and flushed with heparinized saline. The port pocket was closed using two layers of absorbable sutures and Dermabond. The vein skin site was closed using a single layer of absorbable suture and Dermabond. Sterile dressings were applied. Patient tolerated the procedure well without an immediate complication. Ultrasound and fluoroscopic images were taken and saved for this procedure. IMPRESSION: Placement of a subcutaneous port device. Electronically Signed   By: Markus Daft M.D.   On: 04/16/2016 14:42    EGD: - Normal esophagus.                           - Erosive gastropathy.                           - Duodenal erosions without bleeding.                           - Oozing duodenal ulcer with adherent clot. Clips                            were placed. Injected.                           - No specimens collected.   Subjective: Last bowel movement was last night and was maroon. No abdominal pain or vomiting.  Discharge Exam: Vitals:   05/09/16 2144 05/10/16 0542  BP: (!) 161/84 (!) 159/73  Pulse: (!) 107 (!) 106  Resp: 18 18  Temp: 98.4 F (36.9 C) 98.4 F (36.9 C)   Vitals:   05/09/16 1626 05/09/16 1650 05/09/16 2144 05/10/16 0542  BP: (!) 143/78  (!) 161/84 (!) 159/73  Pulse: 91  (!) 107 (!) 106  Resp: '18  18 18  '$ Temp: 97.8 F (36.6 C) 98 F (36.7 C) 98.4 F (36.9 C) 98.4 F (36.9 C)  TempSrc: Oral Oral Oral Oral  SpO2: 100%  99% 97%  Weight:      Height:        General: Pt is alert, awake, not in acute distress Cardiovascular: RRR, S1/S2 +, no rubs, no gallops Respiratory: CTA bilaterally, no wheezing, no rhonchi Abdominal: Soft, NT, ND, bowel sounds + Extremities: no edema, no cyanosis    The results of significant diagnostics from this hospitalization (including imaging, microbiology, ancillary and laboratory) are listed below for  reference.     Microbiology: Recent Results (from the past 240 hour(s))  MRSA PCR Screening     Status: None   Collection Time: 05/07/16 10:31 PM  Result Value Ref Range Status   MRSA by PCR NEGATIVE NEGATIVE Final    Comment:        The GeneXpert MRSA  Assay (FDA approved for NASAL specimens only), is one component of a comprehensive MRSA colonization surveillance program. It is not intended to diagnose MRSA infection nor to guide or monitor treatment for MRSA infections.      Labs: BNP (last 3 results) No results for input(s): BNP in the last 8760 hours. Basic Metabolic Panel:  Recent Labs Lab 05/07/16 1947 05/09/16 0314 05/10/16 0844  NA 131* 135 132*  K 4.4 4.1 3.3*  CL 99* 111 103  CO2 20* 17* 19*  GLUCOSE 179* 105* 126*  BUN 75* 47* 33*  CREATININE 1.60* 1.11* 1.06*  CALCIUM 9.3 7.8* 7.7*   Liver Function Tests:  Recent Labs Lab 05/07/16 1947  AST 70*  ALT 19  ALKPHOS 81  BILITOT 0.5  PROT 6.7  ALBUMIN 2.7*   No results for input(s): LIPASE, AMYLASE in the last 168 hours. No results for input(s): AMMONIA in the last 168 hours. CBC:  Recent Labs Lab 05/07/16 1947  05/08/16 0919 05/08/16 1454 05/08/16 2117 05/09/16 0314 05/09/16 1014 05/10/16 0844  WBC 3.6*  --  3.1*  --   --  3.4*  --  4.7  NEUTROABS 2.6  --   --   --   --   --   --   --   HGB 9.1*  < > 9.2* 8.5* 8.6* 8.6* 8.8* 8.9*  HCT 28.2*  < > 27.0* 25.1* 25.1* 25.3* 26.3* 26.4*  MCV 82.2  --  82.3  --   --  83.0  --  82.0  PLT 28*  --  57*  --   --  21*  --  160  < > = values in this interval not displayed. Cardiac Enzymes:  Recent Labs Lab 05/07/16 1947  TROPONINI 0.07*   BNP: Invalid input(s): POCBNP CBG: No results for input(s): GLUCAP in the last 168 hours. D-Dimer No results for input(s): DDIMER in the last 72 hours. Hgb A1c No results for input(s): HGBA1C in the last 72 hours. Lipid Profile No results for input(s): CHOL, HDL, LDLCALC, TRIG, CHOLHDL, LDLDIRECT in  the last 72 hours. Thyroid function studies No results for input(s): TSH, T4TOTAL, T3FREE, THYROIDAB in the last 72 hours.  Invalid input(s): FREET3 Anemia work up No results for input(s): VITAMINB12, FOLATE, FERRITIN, TIBC, IRON, RETICCTPCT in the last 72 hours. Urinalysis    Component Value Date/Time   COLORURINE AMBER (A) 03/31/2016 1242   APPEARANCEUR HAZY (A) 03/31/2016 1242   LABSPEC 1.017 03/31/2016 1242   PHURINE 6.0 03/31/2016 1242   GLUCOSEU NEGATIVE 03/31/2016 1242   HGBUR NEGATIVE 03/31/2016 1242   BILIRUBINUR NEGATIVE 03/31/2016 1242   KETONESUR NEGATIVE 03/31/2016 1242   PROTEINUR NEGATIVE 03/31/2016 1242   UROBILINOGEN 0.2 11/21/2008 1531   NITRITE NEGATIVE 03/31/2016 1242   LEUKOCYTESUR NEGATIVE 03/31/2016 1242   Sepsis Labs Invalid input(s): PROCALCITONIN,  WBC,  LACTICIDVEN Microbiology Recent Results (from the past 240 hour(s))  MRSA PCR Screening     Status: None   Collection Time: 05/07/16 10:31 PM  Result Value Ref Range Status   MRSA by PCR NEGATIVE NEGATIVE Final    Comment:        The GeneXpert MRSA Assay (FDA approved for NASAL specimens only), is one component of a comprehensive MRSA colonization surveillance program. It is not intended to diagnose MRSA infection nor to guide or monitor treatment for MRSA infections.      Time coordinating discharge: Over 30 minutes  SIGNED:   Kathie Dike, MD  [facility] 05/10/2016, 10:49 AM  Pager   If 7PM-7AM, please contact night-coverage [email or web address] Password [email or web address]

## 2016-05-10 NOTE — Progress Notes (Signed)
No further bloody stools. Tolerating diet. Hemoglobin stable.  Gastric biopsies revealed gastritis. Stains negative for H. pylori.   Vital signs in last 24 hours: Temp:  [97.7 F (36.5 C)-99 F (37.2 C)] 98.4 F (36.9 C) (01/27 0542) Pulse Rate:  [88-107] 106 (01/27 0542) Resp:  [17-22] 18 (01/27 0542) BP: (136-161)/(70-88) 159/73 (01/27 0542) SpO2:  [97 %-100 %] 97 % (01/27 0542) Last BM Date: 05/09/16 General:   Alert,   pleasant and cooperative in NAD Abdomen:  Soft, nontender and nondistended.  Normal bowel sounds, without guarding, and without rebound.  No mass or organomegaly. Extremities:  Without clubbing or edema.    Intake/Output from previous day: 01/26 0701 - 01/27 0700 In: 2853 [P.O.:960; I.V.:1300; Blood:593] Out: 752 [Urine:752] Intake/Output this shift: No intake/output data recorded.  Lab Results:  Recent Labs  05/08/16 0919  05/09/16 0314 05/09/16 1014 05/10/16 0844  WBC 3.1*  --  3.4*  --  4.7  HGB 9.2*  < > 8.6* 8.8* 8.9*  HCT 27.0*  < > 25.3* 26.3* 26.4*  PLT 57*  --  21*  --  160  < > = values in this interval not displayed. BMET  Recent Labs  05/07/16 1947 05/09/16 0314 05/10/16 0844  NA 131* 135 132*  K 4.4 4.1 3.3*  CL 99* 111 103  CO2 20* 17* 19*  GLUCOSE 179* 105* 126*  BUN 75* 47* 33*  CREATININE 1.60* 1.11* 1.06*  CALCIUM 9.3 7.8* 7.7*   LFT  Recent Labs  05/07/16 1947  PROT 6.7  ALBUMIN 2.7*  AST 70*  ALT 19  ALKPHOS 81  BILITOT 0.5   PT/INR  Recent Labs  05/07/16 1947  LABPROT 16.3*  INR 1.30   Hepatitis Panel No results for input(s): HEPBSAG, HCVAB, HEPAIGM, HEPBIGM in the last 72 hours. C-Diff No results for input(s): CDIFFTOX in the last 72 hours.  Studies/Results: No results found.   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 Biopsy negative for H. pylori.   Recommendations:  Avoid all NSAIDs.  Once daily Protonix 40 mg should  suffice  No future MRI until clips:  Outpatient follow-up with [place] to be arranged.

## 2016-05-10 NOTE — Progress Notes (Signed)
Patient being discharged to home after finishing up Protonix drip. IV access in right arm removed and covered with bandaid. Port deacessed using aseptic technique. Daughter is transporting mother and signed for discharge paperwork.

## 2016-05-12 ENCOUNTER — Encounter (HOSPITAL_COMMUNITY): Payer: Self-pay | Admitting: Internal Medicine

## 2016-05-13 ENCOUNTER — Encounter: Payer: Self-pay | Admitting: Internal Medicine

## 2016-05-13 ENCOUNTER — Encounter (HOSPITAL_COMMUNITY): Payer: Self-pay | Admitting: Hematology & Oncology

## 2016-05-14 ENCOUNTER — Encounter (HOSPITAL_COMMUNITY): Payer: Self-pay | Admitting: Hematology & Oncology

## 2016-05-15 ENCOUNTER — Encounter (HOSPITAL_BASED_OUTPATIENT_CLINIC_OR_DEPARTMENT_OTHER): Payer: Medicare Other | Admitting: Adult Health

## 2016-05-15 ENCOUNTER — Encounter (HOSPITAL_COMMUNITY): Payer: Self-pay | Admitting: Hematology & Oncology

## 2016-05-15 ENCOUNTER — Ambulatory Visit (HOSPITAL_COMMUNITY): Payer: Medicare Other | Admitting: Hematology & Oncology

## 2016-05-15 ENCOUNTER — Encounter (HOSPITAL_COMMUNITY): Payer: Medicare Other | Attending: Hematology & Oncology

## 2016-05-15 VITALS — BP 173/86 | HR 110 | Temp 97.6°F | Resp 20

## 2016-05-15 VITALS — BP 160/87 | HR 74 | Temp 97.5°F | Resp 18 | Wt 91.6 lb

## 2016-05-15 DIAGNOSIS — C3412 Malignant neoplasm of upper lobe, left bronchus or lung: Secondary | ICD-10-CM

## 2016-05-15 DIAGNOSIS — E871 Hypo-osmolality and hyponatremia: Secondary | ICD-10-CM

## 2016-05-15 DIAGNOSIS — C3492 Malignant neoplasm of unspecified part of left bronchus or lung: Secondary | ICD-10-CM | POA: Insufficient documentation

## 2016-05-15 DIAGNOSIS — Z5111 Encounter for antineoplastic chemotherapy: Secondary | ICD-10-CM | POA: Diagnosis not present

## 2016-05-15 DIAGNOSIS — E876 Hypokalemia: Secondary | ICD-10-CM

## 2016-05-15 DIAGNOSIS — D649 Anemia, unspecified: Secondary | ICD-10-CM

## 2016-05-15 DIAGNOSIS — E860 Dehydration: Secondary | ICD-10-CM

## 2016-05-15 LAB — COMPREHENSIVE METABOLIC PANEL
ALBUMIN: 2.4 g/dL — AB (ref 3.5–5.0)
ALK PHOS: 53 U/L (ref 38–126)
ALT: 13 U/L — ABNORMAL LOW (ref 14–54)
AST: 50 U/L — AB (ref 15–41)
Anion gap: 10 (ref 5–15)
BILIRUBIN TOTAL: 0.6 mg/dL (ref 0.3–1.2)
BUN: 20 mg/dL (ref 6–20)
CO2: 23 mmol/L (ref 22–32)
Calcium: 8.1 mg/dL — ABNORMAL LOW (ref 8.9–10.3)
Chloride: 97 mmol/L — ABNORMAL LOW (ref 101–111)
Creatinine, Ser: 1.19 mg/dL — ABNORMAL HIGH (ref 0.44–1.00)
GFR calc Af Amer: 51 mL/min — ABNORMAL LOW (ref >60)
GFR calc non Af Amer: 44 mL/min — ABNORMAL LOW (ref >60)
GLUCOSE: 233 mg/dL — AB (ref 65–99)
POTASSIUM: 2.9 mmol/L — AB (ref 3.5–5.1)
SODIUM: 130 mmol/L — AB (ref 135–145)
TOTAL PROTEIN: 6.2 g/dL — AB (ref 6.5–8.1)

## 2016-05-15 LAB — CBC WITH DIFFERENTIAL/PLATELET
BASOS ABS: 0 10*3/uL (ref 0.0–0.1)
BASOS PCT: 0 %
Eosinophils Absolute: 0 10*3/uL (ref 0.0–0.7)
Eosinophils Relative: 0 %
HEMATOCRIT: 25.3 % — AB (ref 36.0–46.0)
HEMOGLOBIN: 8.3 g/dL — AB (ref 12.0–15.0)
Lymphocytes Relative: 8 %
Lymphs Abs: 0.3 10*3/uL — ABNORMAL LOW (ref 0.7–4.0)
MCH: 27.6 pg (ref 26.0–34.0)
MCHC: 32.8 g/dL (ref 30.0–36.0)
MCV: 84.1 fL (ref 78.0–100.0)
Monocytes Absolute: 0.8 10*3/uL (ref 0.1–1.0)
Monocytes Relative: 18 %
NEUTROS ABS: 3 10*3/uL (ref 1.7–7.7)
NEUTROS PCT: 74 %
Platelets: 296 10*3/uL (ref 150–400)
RBC: 3.01 MIL/uL — ABNORMAL LOW (ref 3.87–5.11)
RDW: 16.6 % — AB (ref 11.5–15.5)
WBC: 4.1 10*3/uL (ref 4.0–10.5)

## 2016-05-15 LAB — IRON AND TIBC
Iron: 11 ug/dL — ABNORMAL LOW (ref 28–170)
Saturation Ratios: 5 % — ABNORMAL LOW (ref 10.4–31.8)
TIBC: 213 ug/dL — ABNORMAL LOW (ref 250–450)
UIBC: 202 ug/dL

## 2016-05-15 LAB — FERRITIN: Ferritin: 497 ng/mL — ABNORMAL HIGH (ref 11–307)

## 2016-05-15 LAB — PREPARE RBC (CROSSMATCH)

## 2016-05-15 MED ORDER — POTASSIUM CHLORIDE CRYS ER 20 MEQ PO TBCR: 40.0000 meq | EXTENDED_RELEASE_TABLET | Freq: Every day | ORAL | 3 refills | Status: DC

## 2016-05-15 MED ORDER — SODIUM CHLORIDE 0.9 % IV SOLN
216.8000 mg | Freq: Once | INTRAVENOUS | Status: AC
  Administered 2016-05-15: 220 mg via INTRAVENOUS
  Filled 2016-05-15: qty 22

## 2016-05-15 MED ORDER — SODIUM CHLORIDE 0.9% FLUSH
10.0000 mL | INTRAVENOUS | Status: DC | PRN
Start: 2016-05-15 — End: 2016-05-15

## 2016-05-15 MED ORDER — HEPARIN SOD (PORK) LOCK FLUSH 100 UNIT/ML IV SOLN
500.0000 [IU] | Freq: Once | INTRAVENOUS | Status: DC | PRN
  Filled 2016-05-15: qty 5

## 2016-05-15 MED ORDER — SODIUM CHLORIDE 0.9 % IV SOLN: 10.0000 mg | Freq: Once | INTRAVENOUS | Status: DC

## 2016-05-15 MED ORDER — POTASSIUM CHLORIDE 10 MEQ/100ML IV SOLN
10.0000 meq | INTRAVENOUS | Status: AC
  Administered 2016-05-15 (×4): 10 meq via INTRAVENOUS
  Filled 2016-05-15 (×4): qty 100

## 2016-05-15 MED ORDER — POTASSIUM CHLORIDE CRYS ER 20 MEQ PO TBCR: 40.0000 meq | EXTENDED_RELEASE_TABLET | Freq: Once | ORAL | Status: DC

## 2016-05-15 MED ORDER — POTASSIUM CHLORIDE 20 MEQ/15ML (10%) PO SOLN
40.0000 meq | Freq: Every day | ORAL | Status: AC
  Administered 2016-05-15: 40 meq via ORAL
  Filled 2016-05-15: qty 30

## 2016-05-15 MED ORDER — GEMCITABINE HCL CHEMO INJECTION 1 GM/26.3ML
750.0000 mg/m2 | Freq: Once | INTRAVENOUS | Status: AC
  Administered 2016-05-15: 1026 mg via INTRAVENOUS
  Filled 2016-05-15: qty 21.74

## 2016-05-15 MED ORDER — SODIUM CHLORIDE 0.9 % IV SOLN
Freq: Once | INTRAVENOUS | Status: AC
  Administered 2016-05-15: 10:00:00 via INTRAVENOUS

## 2016-05-15 MED ORDER — PALONOSETRON HCL INJECTION 0.25 MG/5ML
0.2500 mg | Freq: Once | INTRAVENOUS | Status: AC
  Administered 2016-05-15: 0.25 mg via INTRAVENOUS
  Filled 2016-05-15: qty 5

## 2016-05-15 MED ORDER — DEXAMETHASONE SODIUM PHOSPHATE 10 MG/ML IJ SOLN
10.0000 mg | Freq: Once | INTRAMUSCULAR | Status: AC
  Administered 2016-05-15: 10 mg via INTRAVENOUS
  Filled 2016-05-15: qty 1

## 2016-05-15 NOTE — Progress Notes (Signed)
Tolerated tx w/o adverse reaction.  Alert, in no distress.  VSS.  Discharged in c/o family via wheelchair.

## 2016-05-15 NOTE — Progress Notes (Signed)
 [facility] [address] 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   BRIEF ONCOLOGIC HISTORY:    Squamous cell carcinoma lung, left (HCC)   03/31/2016 Initial Diagnosis    Squamous cell carcinoma lung, left (HCC)     04/21/2016 PET scan    1. Primary 7.7 by 5.0 cm left upper lobe mass occludes the left upper lobe bronchus and has a maximum SUV of 22.8. Scattered bilateral pulmonary nodules are hypermetabolic indicating metastatic disease. Considerable mediastinal and hilar pathologic adenopathy. Faintly activity in the adrenal glands could represent early metastatic disease to the adrenals. 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 2. There is a small hypermetabolic focus along the right posterior glottic level in the vicinity of the right arytenoid cartilage without CT correlate, maximum SUV 6.3. Given the lack of CT correlate this may simply be physiologic but is focally prominent and may merit attention on follow up studies.      04/21/2016 Pathology Results    Guardant360- Alterations detected: TP53. SMO, MYC, RB1, BRCA1, GATA3, and MET.  ALK/EGFR NEGATIVE.      04/24/2016 -  Chemotherapy    The patient had palonosetron (ALOXI) injection 0.25 mg, 0.25 mg, Intravenous,  Once, 1 of 4 cycles  CARBOplatin (PARAPLATIN) 240 mg in sodium chloride 0.9 % 250 mL chemo infusion, 240 mg (100 % of original dose 241 mg), Intravenous,  Once, 1 of 4 cycles Dose modification:   (original dose 241 mg, Cycle 1)  gemcitabine (GEMZAR) 1,026 mg in sodium chloride 0.9 % 250 mL  chemo infusion, 750 mg/m2 = 1,026 mg (100 % of original dose 750 mg/m2), Intravenous,  Once, 1 of 4 cycles Dose modification: 750 mg/m2 (original dose 750 mg/m2, Cycle 1, Reason: Provider Judgment)  for chemotherapy treatment.          HISTORY OF PRESENT ILLNESS:  Reportedly, she did not tolerate cycle #1 of chemotherapy; this was despite a 25% dose-reduction in Gemcitabine. She required IVF for dehydration and symptomatic hypotension; BP responded well to IVF and holding her anti-HTN medications.  She also required 2 units PRBCs for symptomatic anemia with Hgb 8.3 on 05/02/16.  Then on 05/07/16 she reported to the emergency room with black tarry stools. She was noted to be hypotensive in the emergency room, with significant thrombocytopenia. She underwent EGD, which showed an oozing duodenal ulcer that was clipped; she received Protonix infusion for 72 hours and was transitioned to twice daily oral Protonix. She received an additional 2 units PRBCs while hospitalized. She also reportedly received 1 unit of platelets as well. She was noted on hospital discharge summary to be DNR.     INTERVAL HISTORY:  She presents to cancer center with her daughter and son for consideration of Day 1, Cycle #2 of Carbo/Gemzar.  She is seen lying in infusion area bed for today's visit.   Her family shares that she has been able to eat a little bit better since she was discharged from the hospital; her appetite is about 50-75%.  She has lost 2 lbs since her last visit on 04/24/16 and   now weighs 91 lbs. She has been feeling very weak and tired.  Her son mentions that he noticed she was dizzy a few times last week, but that has improved.  They are continuing to hold her BP medications d/t recent hypotension.    Denies blood in her stool or melena.  She is voiding well.  Ms. Monk expresses not liking to have to come to the cancer center so often for blood transfusions, IVF, etc. "I hate having to come up here so  much. It wears me out."  She acknowledges that she has been through a lot in the past few weeks with her last chemo treatment and subsequent hospitalization with GIB from duodenal ulceration.     REVIEW OF SYSTEMS:  Review of Systems  Constitutional: Positive for fatigue. Negative for chills and fever.  HENT:  Negative.  Negative for mouth sores, sore throat and trouble swallowing.   Eyes: Negative.   Respiratory: Negative.  Negative for cough and shortness of breath.   Cardiovascular: Negative.  Negative for chest pain, leg swelling and palpitations.  Gastrointestinal: Negative.  Negative for blood in stool, diarrhea, nausea and vomiting.  Endocrine: Negative.   Genitourinary: Negative for dysuria, hematuria and vaginal bleeding.   Musculoskeletal: Positive for gait problem.  Skin:       Pt's daughter reports, "Her tail bone has been sore because she doesn't have any meat back there. We are using a donut at home to help relieve the pressure."  Denies any sacral ulcers or open wounds.   Neurological: Positive for dizziness, extremity weakness, gait problem and light-headedness.  Hematological: Negative for adenopathy.  Psychiatric/Behavioral: Negative.      PAST MEDICAL/SURGICAL HISTORY:  Past Medical History:  Diagnosis Date  . Cancer (HCC)    bladder  . DNR (do not resuscitate)   . Elevated transaminase level   . Goals of care, counseling/discussion 05/01/2016  . H/O renal artery stenosis   . Hyperlipidemia   . Hypertension   . Osteoporosis   . Squamous cell carcinoma lung, left (HCC) 03/31/2016   Past Surgical History:  Procedure Laterality Date  . AORTIC VALVULOPLASTY  1995  . CAROTID ENDARTERECTOMY Right   . COLONOSCOPY  10/2004   Rourk: minimal internal hemorrhoids, otherwise normal colon. next exam 2016  . ESOPHAGOGASTRODUODENOSCOPY N/A 05/08/2016   Procedure: ESOPHAGOGASTRODUODENOSCOPY (EGD);  Surgeon: Robert M Rourk, MD;  [facility];  Service: Endoscopy;   Laterality: N/A;  . FLEXIBLE BRONCHOSCOPY Bilateral 04/02/2016   Procedure: FLEXIBLE BRONCHOSCOPY;  Surgeon: Edward Hawkins, MD;  Location: [facility];  Service: Thoracic;  Laterality: Bilateral;  . IR GENERIC HISTORICAL  04/16/2016   IR FLUORO GUIDE PORT INSERTION RIGHT 04/16/2016 Adam Henn, MD [facility]  . IR GENERIC HISTORICAL  04/16/2016   IR US GUIDE VASC ACCESS RIGHT 04/16/2016 Adam Henn, MD [facility]  . KIDNEY SURGERY  1995   "blockages" - ?stenting  . TUBAL LIGATION       SOCIAL HISTORY:  Social History   Social History  . Marital status: Married    Spouse name: N/A  . Number of children: N/A  . Years of education: N/A   Occupational History  . 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 Smoking status: Current Every Day Smoker    Packs/day: 1.00    Types: Cigarettes  . Smokeless tobacco: Never Used     Comment: currently only 4 cigs/day  . Alcohol use No  .   Drug use: No  . Sexual activity: Not on file     Comment: married   Other Topics Concern  . Not on file   Social History Narrative  . No narrative on file    FAMILY HISTORY:  Family History  Problem Relation Age of Onset  . Hypertension Sister   . Hypertension Other   . Stroke Other   . Heart attack Other   . Dementia Mother 90    significant ASCVD  . Lung cancer Father 9  . Brain cancer Brother 15    CURRENT MEDICATIONS:  Outpatient Encounter Prescriptions as of 05/15/2016  Medication Sig Note  . acetaminophen (TYLENOL) 500 MG tablet Take 1,000 mg by mouth every 6 (six) hours as needed for moderate pain.   Marland Kitchen alendronate (FOSAMAX) 70 MG tablet Take 1 tablet (70 mg total) by mouth every 7 (seven) days. Take with a full glass of water on an empty stomach sitting upright. (Patient not taking: Reported on 05/07/2016)   . atenolol (TENORMIN) 50 MG tablet TAKE (1) TABLET BY MOUTH TWICE DAILY. (Patient not taking: Reported on 05/07/2016) 04/16/2016: Pt states takes whole tab in am  (4ms) then takes 1/2 tab in evening (269m  . calcium-vitamin D (OSCAL WITH D) 500-200 MG-UNIT per tablet Take 1 tablet by mouth daily.    . Marland KitchenARBOPLATIN IV Inject into the vein.   . Marland Kitcheno-enzyme Q-10 30 MG capsule Take 100 mg by mouth daily.   . Cyanocobalamin (B-12) 250 MCG TABS Take 1 tablet by mouth daily.    . Gemcitabine HCl (GEMZAR IV) Inject into the vein.   . Marland Kitchenidocaine-prilocaine (EMLA) cream Apply a quarter size amount to affected area 1 hour prior to coming to chemotherapy. Cover with plastic.   . megestrol (MEGACE) 400 MG/10ML suspension Place 10 mLs (400 mg total) into feeding tube 2 (two) times daily. (Patient taking differently: Take 400 mg by mouth 2 (two) times daily. )   . multivitamin-lutein (OCUVITE-LUTEIN) CAPS capsule Take 1 capsule by mouth daily.   . ondansetron (ZOFRAN) 8 MG tablet Take 1 tablet (8 mg total) by mouth every 8 (eight) hours as needed for nausea or vomiting.   . pantoprazole (PROTONIX) 40 MG tablet Take 1 tablet (40 mg total) by mouth 2 (two) times daily before a meal.   . pravastatin (PRAVACHOL) 20 MG tablet Take 1 tablet (20 mg total) by mouth daily.   . prochlorperazine (COMPAZINE) 10 MG tablet Take 1 tablet (10 mg total) by mouth every 6 (six) hours as needed for nausea or vomiting.   . sennosides-docusate sodium (SENOKOT-S) 8.6-50 MG tablet Take 1 tablet by mouth daily.   . traMADol (ULTRAM) 50 MG tablet Take 1-2 tablets (50-100 mg total) by mouth every 6 (six) hours as needed for moderate pain or severe pain.   . Marland Kitchenolpidem (AMBIEN) 5 MG tablet Take 1 tablet (5 mg total) by mouth at bedtime as needed for sleep.    No facility-administered encounter medications on file as of 05/15/2016.     ALLERGIES:  Allergies  Allergen Reactions  . Penicillins Other (See Comments)    Childhood allergy. Has patient had a PCN reaction causing immediate rash, facial/tongue/throat swelling, SOB or lightheadedness with hypotension: unknown Has patient had a PCN reaction  causing severe rash involving mucus membranes or skin necrosis: unknown Has patient had a PCN reaction that required hospitalization: unknown Has patient had a PCN reaction occurring within the last 10 years: unknown If all of the above answers are "  NO", then may proceed with Cephalosporin use.   . Sulfa Antibiotics Other (See Comments)    unknown     PHYSICAL EXAM:  Performance status: 2-3; Difficulty ambulating d/t weakness; requires assistance for self-care.     Physical Exam  Constitutional: She is oriented to person, place, and time.  Frail, thin, cachetic   HENT:  Head: Normocephalic.  Mouth/Throat: Oropharynx is clear and moist. No oropharyngeal exudate.  Eyes: Conjunctivae are normal. No scleral icterus.  Neck: Normal range of motion. Neck supple.  Cardiovascular: Normal rate, regular rhythm and normal heart sounds.   Pulmonary/Chest: Effort normal and breath sounds normal. No respiratory distress.  Abdominal: Soft. Bowel sounds are normal. There is no tenderness. There is no guarding.  Musculoskeletal: Normal range of motion. She exhibits no edema.  Lymphadenopathy:    She has no cervical adenopathy.  Neurological: She is alert and oriented to person, place, and time.  Skin: Skin is warm and dry. There is pallor.  Psychiatric: Mood, memory and judgment normal.  Flat affect      LABORATORY DATA:  I have reviewed the labs as listed.  CBC    Component Value Date/Time   WBC 4.7 05/10/2016 0844   RBC 3.22 (L) 05/10/2016 0844   HGB 8.9 (L) 05/10/2016 0844   HCT 26.4 (L) 05/10/2016 0844   PLT 160 05/10/2016 0844   MCV 82.0 05/10/2016 0844   MCH 27.6 05/10/2016 0844   MCHC 33.7 05/10/2016 0844   RDW 16.5 (H) 05/10/2016 0844   LYMPHSABS 0.8 05/07/2016 1947   MONOABS 0.2 05/07/2016 1947   EOSABS 0.0 05/07/2016 1947   BASOSABS 0.0 05/07/2016 1947   CMP Latest Ref Rng & Units 05/10/2016 05/09/2016 05/07/2016  Glucose 65 - 99 mg/dL 126(H) 105(H) 179(H)  BUN 6 - 20  mg/dL 33(H) 47(H) 75(H)  Creatinine 0.44 - 1.00 mg/dL 1.06(H) 1.11(H) 1.60(H)  Sodium 135 - 145 mmol/L 132(L) 135 131(L)  Potassium 3.5 - 5.1 mmol/L 3.3(L) 4.1 4.4  Chloride 101 - 111 mmol/L 103 111 99(L)  CO2 22 - 32 mmol/L 19(L) 17(L) 20(L)  Calcium 8.9 - 10.3 mg/dL 7.7(L) 7.8(L) 9.3  Total Protein 6.5 - 8.1 g/dL - - 6.7  Total Bilirubin 0.3 - 1.2 mg/dL - - 0.5  Alkaline Phos 38 - 126 U/L - - 81  AST 15 - 41 U/L - - 70(H)  ALT 14 - 54 U/L - - 19    PENDING LABS:    DIAGNOSTIC IMAGING:  PET scan: 04/21/16     PATHOLOGY:  LUL lung biopsy: 04/02/16       ASSESSMENT & PLAN:  Ms. Aybar is a 75 y.o. female with Stage IVA (T4N3M1a) Squamous cell carcinoma of the left upper lobe. Being treated currently with Carbo/Gemzar with 25% dose reduction in gemcitabine for cycle 1. She presents today for consideration for day 1, cycle #2 of carboplatin/gemcitabine.  Stage IVA lung cancer: -Gemzar was dose reduced by 25% for cycle 1; we will dose reduce carboplatin by 25% as well for cycle #2 today. The patient's plan of care was discussed via phone with Dr. Penland, who agrees with dose reduction given the patient's current clinical status. I had a lengthy discussion with the patient and family about the patient's goals of care (see below). Ms. Orne is interested in continuing with dose-reduced chemotherapy, but states "I will do this round, but if it makes me as sick as the last time, then I'm not doing this anymore."  Labs reviewed and adequate   for treatment. She will receive cycle #2, day 1 today. She will return next week for day 8 of cycle #2.  Symptomatic anemia:  -The patient is significantly weak, fatigued, & pale. Her hemoglobin is 8.3 today; iron studies added on to labs today. If she needs IV iron supplementation in the future, we'll make arrangements for this. Her symptomatic anemia is likely secondary to recent GI blood loss, as well as antineoplastic therapy. We will plan to  give her 2 units PRBCs tomorrow here at the cancer center. She is not happy about having to come back to the cancer Center 2 days in a row. She tells me, "I will do it for this round, but if I have to keep coming back all the time, then I'm not going to do this anymore."  She was noted to be thrombocytopenic while hospitalized; her platelet count is normal today.  Hypokalemia/Hyponatremia/Dehydration:  -Serum potassium 2.9 today. We will replete her potassium with 40 mEq IV KCl, as well as 40 mEq of oral potassium while in clinic today. I will also send a prescription for her to take 40 mEq of potassium daily at home, at least during the remaining course of her treatments. Her sodium is slightly low today at 130. Her creatinine is mildly elevated at 1.19. She will receive some fluids today with her chemotherapy and potassium replacement. We will continue to monitor and give IV fluids if needed. She will return tomorrow for 2 units of blood, which will help improve her volume depletion as well.  Goals of care discussion:  -We spent quite a bit of time today reviewing Ms. Hinnant's current situation and her current symptom burden, as a result of her disease & first cycle of treatment. Her daughter and son were present for this discussion. She acknowledges that she had a difficult time with her chemotherapy, resulting in needing IV fluids and blood transfusion; she was also hospitalized for a bleeding duodenal ulcer a few days later, requiring additional transfusions. I asked her to share with me her goals of treatment. She tells me that she "wants to do something to fix the cancer and I'm not ready to give up." We discussed other options of treatment, in general, as it relates to lung cancer. I shared with her that I think surgery would be difficult given her frail state, as well as the size and location of the tumor. Radiation would also be difficult at this time, given the size of the tumor and the amount of  normal tissue that may be irradiated, with the potential of worsening her symptoms. The goal as she understands it has been for her to receive 3 cycles of chemotherapy and then consider adjuvant radiation therapy. We discussed another viable option would be to consider stopping active treatment, and pursuing home hospice. We discussed the pros and cons of both continued treatment and hospice. She tells me she is not ready for hospice at this time, and wants to try "one more round of chemotherapy." She stated several times, that her quality of life is important to her. She was able to acknowledge that her quality of life right now is poor given her current symptom burden.  I reiterated that hospice's main focus would be improving her quality of life and controlling her symptoms at home. I shared with her the results of the research study which showed that early implementation of palliative care actually improved quality of life and helped lung cancer patients live longer. She remains   steadfast in her decision to continue with at least this round of treatment. Her family is supportive of this decision. We will certainly revisit this discussion in the future. Lastly, we discussed that should there be an emergency here at the cancer center, if her heart were to stop beating or if she were to stop breathing what action she would want us to take. She expressed she would not want chest compressions or to be intubated. Her family also confirmed this decision. DO NOT RESUSCITATE/DO NOT INTUBATE signed today.   Code status: DNR/DNI; orders placed in EPIC.    Dispo:  -Return to cancer center tomorrow for 2 units PRBC transfusion -After tomorrow, return to cancer center in 1 week for Cycle #2, Day 8.    All questions were answered to patient's stated satisfaction. Encouraged patient to call with any new concerns or questions before her next visit to the cancer center and we can certain see her sooner, if needed.      Gretchen Dawson, NP [facility] [phone]   

## 2016-05-15 NOTE — Patient Instructions (Addendum)
[place] at [facility] Discharge Instructions  RECOMMENDATIONS MADE BY THE CONSULTANT AND ANY TEST RESULTS WILL BE SENT TO YOUR REFERRING PHYSICIAN.  Return tomorrow for blood  Return next week for Day 8 of chemo and to see Gershon Mussel   Thank you for choosing [place] at [facility] to provide your oncology and hematology care.  To afford each patient quality time with our provider, please arrive at least 15 minutes before your scheduled appointment time.    If you have a lab appointment with the [facility] please come in thru the  Main Entrance and check in at the main information desk  You need to re-schedule your appointment should you arrive 10 or more minutes late.  We strive to give you quality time with our providers, and arriving late affects you and other patients whose appointments are after yours.  Also, if you no show three or more times for appointments you may be dismissed from the clinic at the providers discretion.     Again, thank you for choosing [facility].  Our hope is that these requests will decrease the amount of time that you wait before being seen by our physicians.       _____________________________________________________________  Should you have questions after your visit to [facility], please contact our office at [phone] between the hours of 8:30 a.m. and 4:30 p.m.  Voicemails left after 4:30 p.m. will not be returned until the following business day.  For prescription refill requests, have your pharmacy contact our office.       Resources For Cancer Patients and their Caregivers ? American Cancer Society: Can assist with transportation, wigs, general needs, runs Look Good Feel Better.        934-278-1938 ? Cancer Care: Provides financial assistance, online support groups, medication/co-pay assistance.  1-800-813-HOPE (671)643-4973) ? [facility] Assists  [place] cancer patients and their families through emotional , educational and financial support.  339-682-2369 ? [place] DSS Where to apply for food stamps, Medicaid and utility assistance. 985-354-8991 ? RC[employer]: Transportation to medical appointments. 971-751-9550 ? Social Security Administration: May apply for disability if have a Stage IV cancer. 208-587-9337 (253)106-4181 ? [employer], Disability and Transit Services: Assists with nutrition, care and transit needs. [place] Support Programs: '@10RELATIVEDAYS'$ @ > Cancer Support Group  2nd Tuesday of the month 1pm-2pm, Journey Room  > Creative Journey  3rd Tuesday of the month 1130am-1pm, Journey Room  > Look Good Feel Better  1st Wednesday of the month 10am-12 noon, Journey Room (Call [place] to register [phone])

## 2016-05-16 ENCOUNTER — Encounter (HOSPITAL_COMMUNITY): Payer: Medicare Other

## 2016-05-16 ENCOUNTER — Other Ambulatory Visit (HOSPITAL_COMMUNITY): Payer: Self-pay | Admitting: Adult Health

## 2016-05-16 ENCOUNTER — Encounter (HOSPITAL_BASED_OUTPATIENT_CLINIC_OR_DEPARTMENT_OTHER): Payer: Medicare Other

## 2016-05-16 DIAGNOSIS — D649 Anemia, unspecified: Secondary | ICD-10-CM | POA: Diagnosis present

## 2016-05-16 DIAGNOSIS — C3492 Malignant neoplasm of unspecified part of left bronchus or lung: Secondary | ICD-10-CM

## 2016-05-16 MED ORDER — HEPARIN SOD (PORK) LOCK FLUSH 100 UNIT/ML IV SOLN
500.0000 [IU] | Freq: Once | INTRAVENOUS | Status: AC
  Administered 2016-05-16: 500 [IU] via INTRAVENOUS

## 2016-05-16 MED ORDER — HEPARIN SOD (PORK) LOCK FLUSH 100 UNIT/ML IV SOLN
INTRAVENOUS | Status: AC
Start: 2016-05-16 — End: 2016-05-16
  Filled 2016-05-16: qty 5

## 2016-05-16 MED ORDER — ACETAMINOPHEN 325 MG PO TABS
650.0000 mg | ORAL_TABLET | Freq: Once | ORAL | Status: AC
  Administered 2016-05-16: 650 mg via ORAL
  Filled 2016-05-16: qty 2

## 2016-05-16 MED ORDER — DIPHENHYDRAMINE HCL 25 MG PO CAPS
25.0000 mg | ORAL_CAPSULE | Freq: Once | ORAL | Status: AC
Start: 2016-05-16 — End: 2016-05-16
  Administered 2016-05-16: 25 mg via ORAL
  Filled 2016-05-16: qty 1

## 2016-05-16 MED ORDER — SODIUM CHLORIDE 0.9 % IV SOLN
250.0000 mL | Freq: Once | INTRAVENOUS | Status: AC
  Administered 2016-05-16: 250 mL via INTRAVENOUS

## 2016-05-16 NOTE — Patient Instructions (Signed)
[facility] at [facility] Discharge Instructions  RECOMMENDATIONS MADE BY THE CONSULTANT AND ANY TEST RESULTS WILL BE SENT TO YOUR REFERRING PHYSICIAN.  2 units of blood given Follow up as scheduled.  Thank you for choosing [facility] at [facility] to provide your oncology and hematology care.  To afford each patient quality time with our provider, please arrive at least 15 minutes before your scheduled appointment time.    If you have a lab appointment with the Cancer Center please come in thru the  Main Entrance and check in at the main information desk  You need to re-schedule your appointment should you arrive 10 or more minutes late.  We strive to give you quality time with our providers, and arriving late affects you and other patients whose appointments are after yours.  Also, if you no show three or more times for appointments you may be dismissed from the clinic at the providers discretion.     Again, thank you for choosing [facility].  Our hope is that these requests will decrease the amount of time that you wait before being seen by our physicians.       _____________________________________________________________  Should you have questions after your visit to [facility], please contact our office at [phone] between the hours of 8:30 a.m. and 4:30 p.m.  Voicemails left after 4:30 p.m. will not be returned until the following business day.  For prescription refill requests, have your pharmacy contact our office.       Resources For Cancer Patients and their Caregivers ? American Cancer Society: Can assist with transportation, wigs, general needs, runs Look Good Feel Better.        1-888-227-6333 ? Cancer Care: Provides financial assistance, online support groups, medication/co-pay assistance.  1-800-813-HOPE (4673) ? [facility] Assists [place] cancer patients and  their families through emotional , educational and financial support.  336-427-4357 ? [place] DSS Where to apply for food stamps, Medicaid and utility assistance. 336-342-1394 ? RCATS: Transportation to medical appointments. 336-347-2287 ? Social Security Administration: May apply for disability if have a Stage IV cancer. 336-342-7796 1-800-772-1213 ? [place] [facility], Disability and Transit Services: Assists with nutrition, care and transit needs. 336-349-2343  Cancer Center Support Programs: @10RELATIVEDAYS@ > Cancer Support Group  2nd Tuesday of the month 1pm-2pm, Journey Room  > Creative Journey  3rd Tuesday of the month 1130am-1pm, Journey Room  > Look Good Feel Better  1st Wednesday of the month 10am-12 noon, Journey Room (Call American Cancer Society to register 1-800-395-5775)   

## 2016-05-16 NOTE — Progress Notes (Signed)
2 units of blood given today as ordered. Vitals stable and discharged from clinic via wheelchair . Follow up as scheduled.

## 2016-05-17 LAB — TYPE AND SCREEN
ABO/RH(D): O NEG
Antibody Screen: NEGATIVE
UNIT DIVISION: 0
Unit division: 0

## 2016-05-22 ENCOUNTER — Ambulatory Visit (HOSPITAL_COMMUNITY): Payer: Medicare Other | Admitting: Hematology & Oncology

## 2016-05-22 ENCOUNTER — Encounter (HOSPITAL_BASED_OUTPATIENT_CLINIC_OR_DEPARTMENT_OTHER): Payer: Medicare Other | Admitting: Oncology

## 2016-05-22 ENCOUNTER — Encounter (HOSPITAL_COMMUNITY): Payer: Self-pay | Admitting: Oncology

## 2016-05-22 ENCOUNTER — Encounter (HOSPITAL_BASED_OUTPATIENT_CLINIC_OR_DEPARTMENT_OTHER): Payer: Medicare Other

## 2016-05-22 VITALS — BP 123/60 | HR 116 | Temp 97.4°F | Resp 20 | Wt 79.1 lb

## 2016-05-22 VITALS — BP 145/90 | HR 99 | Temp 97.3°F | Resp 18

## 2016-05-22 DIAGNOSIS — E860 Dehydration: Secondary | ICD-10-CM

## 2016-05-22 DIAGNOSIS — R000 Tachycardia, unspecified: Secondary | ICD-10-CM

## 2016-05-22 DIAGNOSIS — E875 Hyperkalemia: Secondary | ICD-10-CM | POA: Diagnosis not present

## 2016-05-22 DIAGNOSIS — C3492 Malignant neoplasm of unspecified part of left bronchus or lung: Secondary | ICD-10-CM

## 2016-05-22 DIAGNOSIS — Z7189 Other specified counseling: Secondary | ICD-10-CM

## 2016-05-22 DIAGNOSIS — C3412 Malignant neoplasm of upper lobe, left bronchus or lung: Secondary | ICD-10-CM

## 2016-05-22 DIAGNOSIS — Z95828 Presence of other vascular implants and grafts: Secondary | ICD-10-CM

## 2016-05-22 DIAGNOSIS — D649 Anemia, unspecified: Secondary | ICD-10-CM

## 2016-05-22 LAB — CBC WITH DIFFERENTIAL/PLATELET
Basophils Absolute: 0 10*3/uL (ref 0.0–0.1)
Basophils Relative: 0 %
EOS ABS: 0 10*3/uL (ref 0.0–0.7)
Eosinophils Relative: 0 %
HEMATOCRIT: 39.6 % (ref 36.0–46.0)
HEMOGLOBIN: 13.3 g/dL (ref 12.0–15.0)
LYMPHS ABS: 0.7 10*3/uL (ref 0.7–4.0)
LYMPHS PCT: 13 %
MCH: 28.3 pg (ref 26.0–34.0)
MCHC: 33.6 g/dL (ref 30.0–36.0)
MCV: 84.3 fL (ref 78.0–100.0)
MONOS PCT: 9 %
Monocytes Absolute: 0.5 10*3/uL (ref 0.1–1.0)
NEUTROS PCT: 78 %
Neutro Abs: 4 10*3/uL (ref 1.7–7.7)
Platelets: 106 10*3/uL — ABNORMAL LOW (ref 150–400)
RBC: 4.7 MIL/uL (ref 3.87–5.11)
RDW: 15.3 % (ref 11.5–15.5)
WBC: 5.1 10*3/uL (ref 4.0–10.5)

## 2016-05-22 LAB — COMPREHENSIVE METABOLIC PANEL
ALK PHOS: 61 U/L (ref 38–126)
ALT: 16 U/L (ref 14–54)
ANION GAP: 10 (ref 5–15)
AST: 71 U/L — ABNORMAL HIGH (ref 15–41)
Albumin: 3.1 g/dL — ABNORMAL LOW (ref 3.5–5.0)
BILIRUBIN TOTAL: 1 mg/dL (ref 0.3–1.2)
BUN: 32 mg/dL — ABNORMAL HIGH (ref 6–20)
CALCIUM: 10.9 mg/dL — AB (ref 8.9–10.3)
CO2: 22 mmol/L (ref 22–32)
CREATININE: 1.69 mg/dL — AB (ref 0.44–1.00)
Chloride: 98 mmol/L — ABNORMAL LOW (ref 101–111)
GFR, EST AFRICAN AMERICAN: 34 mL/min — AB (ref >60)
GFR, EST NON AFRICAN AMERICAN: 29 mL/min — AB (ref >60)
Glucose, Bld: 149 mg/dL — ABNORMAL HIGH (ref 65–99)
Potassium: 5.5 mmol/L — ABNORMAL HIGH (ref 3.5–5.1)
SODIUM: 130 mmol/L — AB (ref 135–145)
TOTAL PROTEIN: 7.6 g/dL (ref 6.5–8.1)

## 2016-05-22 LAB — IRON AND TIBC
IRON: 33 ug/dL (ref 28–170)
Saturation Ratios: 13 % (ref 10.4–31.8)
TIBC: 248 ug/dL — AB (ref 250–450)
UIBC: 215 ug/dL

## 2016-05-22 LAB — FERRITIN: FERRITIN: 1539 ng/mL — AB (ref 11–307)

## 2016-05-22 MED ORDER — ZOLEDRONIC ACID 4 MG/5ML IV CONC
3.3000 mg | Freq: Once | INTRAVENOUS | Status: AC
  Administered 2016-05-22: 3.3 mg via INTRAVENOUS
  Filled 2016-05-22: qty 4.13

## 2016-05-22 MED ORDER — SODIUM CHLORIDE 0.9 % IV SOLN
INTRAVENOUS | Status: DC
  Administered 2016-05-22: 14:00:00 via INTRAVENOUS

## 2016-05-22 MED ORDER — HEPARIN SOD (PORK) LOCK FLUSH 100 UNIT/ML IV SOLN
500.0000 [IU] | Freq: Once | INTRAVENOUS | Status: AC
Start: 2016-05-22 — End: 2016-05-22
  Administered 2016-05-22: 500 [IU] via INTRAVENOUS

## 2016-05-22 MED ORDER — MORPHINE SULFATE ER 15 MG PO TBCR: 15.0000 mg | EXTENDED_RELEASE_TABLET | Freq: Two times a day (BID) | ORAL | 0 refills | Status: AC

## 2016-05-22 MED ORDER — HEPARIN SOD (PORK) LOCK FLUSH 100 UNIT/ML IV SOLN
INTRAVENOUS | Status: AC
  Filled 2016-05-22: qty 5

## 2016-05-22 NOTE — Patient Instructions (Addendum)
[place] at [facility] Discharge Instructions  RECOMMENDATIONS MADE BY THE CONSULTANT AND ANY TEST RESULTS WILL BE SENT TO YOUR REFERRING PHYSICIAN.  Exam with Robynn Pane, PA. We will do labs next Thursday. Return as scheduled for the next treatment, labs, and follow up with the provider. Stop taking your potassium. IV fluids and Zometa today.   Please see Amy as you leave for your appointments.     Thank you for choosing [place] at [facility] to provide your oncology and hematology care.  To afford each patient quality time with our provider, please arrive at least 15 minutes before your scheduled appointment time.    If you have a lab appointment with the [facility] please come in thru the  Main Entrance and check in at the main information desk  You need to re-schedule your appointment should you arrive 10 or more minutes late.  We strive to give you quality time with our providers, and arriving late affects you and other patients whose appointments are after yours.  Also, if you no show three or more times for appointments you may be dismissed from the clinic at the providers discretion.     Again, thank you for choosing [facility].  Our hope is that these requests will decrease the amount of time that you wait before being seen by our physicians.       _____________________________________________________________  Should you have questions after your visit to [facility], please contact our office at [phone] between the hours of 8:30 a.m. and 4:30 p.m.  Voicemails left after 4:30 p.m. will not be returned until the following business day.  For prescription refill requests, have your pharmacy contact our office.       Resources For Cancer Patients and their Caregivers ? American Cancer Society: Can assist with transportation, wigs, general needs, runs Look Good Feel Better.         930-148-3575 ? Cancer Care: Provides financial assistance, online support groups, medication/co-pay assistance.  1-800-813-HOPE (575)160-6084) ? [place] Assists [place] cancer patients and their families through emotional , educational and financial support.  (231)701-8171 ? [place] DSS Where to apply for food stamps, Medicaid and utility assistance. (913)880-8395 ? RCATS: Transportation to medical appointments. 9076218601 ? Social Security Administration: May apply for disability if have a Stage IV cancer. 984-553-8378 2104790412 ? [employer], Disability and Transit Services: Assists with nutrition, care and transit needs. [place] Support Programs: '@10RELATIVEDAYS'$ @ > Cancer Support Group  2nd Tuesday of the month 1pm-2pm, Journey Room  > Creative Journey  3rd Tuesday of the month 1130am-1pm, Journey Room  > Look Good Feel Better  1st Wednesday of the month 10am-12 noon, Journey Room (Call [place] to register [phone])

## 2016-05-22 NOTE — Assessment & Plan Note (Signed)
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  Started palliative systemic chemotherapy beginning on 04/24/2016 consisting of Carboplatin/Gemcitabine in a day 1, 8 every 21 days.  Oncology history is updated.  Pre-treatment labs today: CBC diff, CMET.  I personally reviewed and went over laboratory results with the patient.  The results are noted within this dictation.  CBC today is excellent. Her metabolic panel is abnormal with a hyperkalemia, worsening creatinine, and hypercalcemia with a corrected calcium 11.6  These abnormalities, we will cancel chemotherapy today. We will correct her hypercalcemia with 1 dose of Zometa. I will reduce this dose to 3.3 mg given her renal function. Her creatinine clearance is calculated at 42. I understand for hypercalcemia there is no dose reduction, but I feel leary of stressing her kidneys any further.  She is taking oral potassium at home. I've asked her to discontinue this medication at this time.  I'll give her IV fluids at 333 cc/hr today, 1 L NS.  She is a little tachycardic as well.  She will continue with Ambien for bed. I'll start her on a low dose, long-acting pain medication, MS Contin 15 mg twice a day. She will still have tramadol for breakthrough pain management if necessary.  She is given a constipation sheet to prevent opiate-induced constipation.  She is given a diarrhea if she does well given her current loose stools.  Goals of care discussion took place again today. She remains very resistant to stopping chemotherapy. Her son seems to be more onboard. She is reminded that she has a terminal diagnosis. She is also reminded that we want to cause no harm and therefore we have to be very selective with who we give chemotherapy to.  She is advised that performance status plays a large role in chemotherapy administration. She currently is an ECOG of 3 which is concerning. Her ongoing  weight loss is concerning. Her recent laboratory abnormalities or concerning.  Return in 2 weeks for follow-up and the start of cycle #3.  If not better, we will need to discuss hospice again. It may not be unreasonable to restage her as well.

## 2016-05-22 NOTE — Progress Notes (Signed)
Tolerated infusion well. Stable on discharge home with son via wheelchair.

## 2016-05-22 NOTE — Progress Notes (Signed)
Michelle Lange, MD [place] [address]  Squamous cell carcinoma lung, left ([place]) - Plan: CBC with Differential, Comprehensive metabolic panel, morphine (MS CONTIN) 15 MG 12 hr tablet, DISCONTINUED: zolendronic acid (ZOMETA) 3.3 mg in sodium chloride 0.9 % 100 mL IVPB  Goals of care, counseling/discussion  Hypercalcemia - Plan: DISCONTINUED: zolendronic acid (ZOMETA) 3.3 mg in sodium chloride 0.9 % 100 mL IVPB  Dehydration - Plan: DISCONTINUED: 0.9 %  sodium chloride infusion  CURRENT THERAPY: Carboplatin/Gemcitabine day 1, 8, every 21 days beginning on 04/24/2016.  INTERVAL HISTORY: Michelle Mendoza 76 y.o. 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    Squamous cell carcinoma lung, left ([place])   03/31/2016 Initial Diagnosis    Squamous cell carcinoma lung, left ([place])     04/21/2016 PET scan    1. Primary 7.7 by 5.0 cm left upper lobe mass occludes the left upper lobe bronchus and has a maximum SUV of 22.8. Scattered bilateral pulmonary nodules are hypermetabolic indicating metastatic disease. Considerable mediastinal and hilar pathologic adenopathy. Faintly activity in the adrenal glands could represent early metastatic disease to the adrenals. 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 2. There is a small hypermetabolic focus along the right posterior glottic level in the vicinity of the right arytenoid cartilage without CT correlate, maximum SUV 6.3. Given the lack of CT correlate this may simply be physiologic but is focally prominent and may merit attention on follow up studies.      04/21/2016 Pathology Results    Guardant360- Alterations detected: TP53. SMO, MYC, RB1, BRCA1, GATA3, and MET.  ALK/EGFR NEGATIVE.      04/24/2016 -  Chemotherapy    The patient had palonosetron (ALOXI) injection 0.25 mg, 0.25 mg, Intravenous,  Once, 2  of 4 cycles  CARBOplatin (PARAPLATIN) 240 mg in sodium chloride 0.9 % 250 mL chemo infusion, 240 mg (100 % of original dose 241 mg), Intravenous,  Once, 2 of 4 cycles Dose modification:   (original dose 241 mg, Cycle 1)  gemcitabine (GEMZAR) 1,026 mg in sodium chloride 0.9 % 250 mL chemo infusion, 750 mg/m2 = 1,026 mg (100 % of original dose 750 mg/m2), Intravenous,  Once, 2 of 4 cycles Dose modification: 750 mg/m2 (original dose 750 mg/m2, Cycle 1, Reason: Provider Judgment)  for chemotherapy treatment.         Despite what the patient says, she is not doing well. She is failing to thrive. Her weight continues to decline. She notes that her appetite is down and her interest in foods down.  She reports difficulty with sleeping.  She reports that when she sleeps she has increased back pain. She also notes some coccyx pain as well. She is very little meat on her bones. She is cachectic.  She reports weakness and fatigue.  She has nausea as well. Home antiemetics have been helpful.  She reports loose stools as well.  She is frustrated today that we do not have any chemotherapy beds available for her as they are occupied by other patients. She is educated that we don't typically give chemotherapy to patients who are unable to sit in a recliner as this is typically an indication of a poor performance status and does would not benefit from ongoing systemic chemotherapy.  Additionally, there are written typically reserved for patients with a long chemotherapy today. For her, if given chemotherapy  today, she will not be her long.  Usually, she is frustrated that the length of time it took for [place] to get her lab results today. She is informed of the laboratory process which we have no control over. She is provided the opportunity to, the day prior to treatment for laboratory work which would speed up the process for chemotherapy administration.  Review of Systems  Constitutional: Positive for  malaise/fatigue and weight loss. Negative for chills and fever.  HENT: Negative.   Eyes: Negative.   Respiratory: Negative.  Negative for cough.   Cardiovascular: Negative.  Negative for chest pain.  Gastrointestinal: Positive for diarrhea and nausea. Negative for constipation and vomiting.  Genitourinary: Negative.   Musculoskeletal: Negative.  Negative for falls.  Skin: Negative.   Neurological: Positive for weakness.  Endo/Heme/Allergies: Negative.   Psychiatric/Behavioral: Negative.     Past Medical History:  Diagnosis Date  . Cancer ([place])    bladder  . DNR (do not resuscitate)   . Elevated transaminase level   . Goals of care, counseling/discussion 05/01/2016  . H/O renal artery stenosis   . Hyperlipidemia   . Hypertension   . Osteoporosis   . Squamous cell carcinoma lung, left ([place]) 03/31/2016    Past Surgical History:  Procedure Laterality Date  . AORTIC VALVULOPLASTY  1995  . CAROTID ENDARTERECTOMY Right   . COLONOSCOPY  10/2004   [place]: minimal internal hemorrhoids, otherwise normal colon. next exam 2016  . ESOPHAGOGASTRODUODENOSCOPY N/A 05/08/2016   Procedure: ESOPHAGOGASTRODUODENOSCOPY (EGD);  Surgeon: Daneil Dolin, MD;  Location: [facility];  Service: Endoscopy;  Laterality: N/A;  . FLEXIBLE BRONCHOSCOPY Bilateral 04/02/2016   Procedure: FLEXIBLE BRONCHOSCOPY;  Surgeon: Sinda Du, MD;  Location: [facility] ENDO SUITE;  Service: Thoracic;  Laterality: Bilateral;  . IR GENERIC HISTORICAL  04/16/2016   IR FLUORO GUIDE PORT INSERTION RIGHT 04/16/2016 Markus Daft, MD [facility]INTER[facility]  . IR GENERIC HISTORICAL  04/16/2016   IR US GUIDE VASC ACCESS RIGHT 04/16/2016 Markus Daft, MD [facility]INTER[facility]  . [place]   "blockages" - ?stenting  . TUBAL LIGATION      Family History  Problem Relation Age of Onset  . Hypertension Sister   . Hypertension Other   . Stroke Other   . Heart attack Other   . Dementia Mother 44    significant ASCVD  . Lung cancer Father 9  . Brain  cancer Brother 17    Social History   Social History  . Marital status: Married    Spouse name: N/A  . Number of children: N/A  . Years of education: N/A   Occupational History  . 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 Smoking status: Current Every Day Smoker    Packs/day: 1.00    Types: Cigarettes  . Smokeless tobacco: Never Used     Comment: currently only 4 cigs/day  . Alcohol use No  . Drug use: No  . Sexual activity: Not Asked     Comment: married   Other Topics Concern  . None   Social History Narrative  . None     PHYSICAL EXAMINATION  ECOG PERFORMANCE STATUS: 1 - Symptomatic but completely ambulatory  Vitals:   05/22/16 1130  BP: 123/60  Pulse: (!) 116  Resp: 20  Temp: 97.4 F (36.3 C)     GENERAL:alert, no distress, well nourished, well developed, cachectic, comfortable, cooperative, smiling and in chemo-recliner, accompanied by son. SKIN: skin color,  texture, turgor are normal, no rashes or significant lesions HEAD: Normocephalic, No masses, lesions, tenderness or abnormalities EYES: normal, EOMI, Conjunctiva are pink and non-injected EARS: External ears normal OROPHARYNX:lips, buccal mucosa, and tongue normal and mucous membranes are moist  NECK: supple, trachea midline LYMPH:  not examined BREAST:not examined LUNGS: clear to auscultation  HEART: regular rate & rhythm, no murmurs and no gallops ABDOMEN:abdomen soft and normal bowel sounds BACK: Back symmetric, no curvature. EXTREMITIES:less then 2 second capillary refill, no joint deformities, effusion, or inflammation, no skin discoloration, no cyanosis  NEURO: alert & oriented x 3 with fluent speech, no focal motor/sensory deficits    LABORATORY DATA: CBC    Component Value Date/Time   WBC 5.1 05/22/2016 1146   RBC 4.70 05/22/2016 1146   HGB 13.3 05/22/2016 1146   HCT 39.6 05/22/2016 1146   PLT 106 (L) 05/22/2016 1146   MCV 84.3 05/22/2016 1146   MCH 28.3  05/22/2016 1146   MCHC 33.6 05/22/2016 1146   RDW 15.3 05/22/2016 1146   LYMPHSABS 0.7 05/22/2016 1146   MONOABS 0.5 05/22/2016 1146   EOSABS 0.0 05/22/2016 1146   BASOSABS 0.0 05/22/2016 1146      Chemistry      Component Value Date/Time   NA 130 (L) 05/22/2016 1146   NA 140 12/27/2015 0903   K 5.5 (H) 05/22/2016 1146   CL 98 (L) 05/22/2016 1146   CO2 22 05/22/2016 1146   BUN 32 (H) 05/22/2016 1146   BUN 40 (H) 12/27/2015 0903   CREATININE 1.69 (H) 05/22/2016 1146   CREATININE 1.06 06/15/2013 0857      Component Value Date/Time   CALCIUM 10.9 (H) 05/22/2016 1146   ALKPHOS 61 05/22/2016 1146   AST 71 (H) 05/22/2016 1146   ALT 16 05/22/2016 1146   BILITOT 1.0 05/22/2016 1146   BILITOT 0.4 12/27/2015 0903        PENDING LABS:   RADIOGRAPHIC STUDIES:  Dg Abd Acute W/chest  Result Date: 05/07/2016 CLINICAL DATA:  Generalized abdominal pain for 3 days. Rectal bleeding. History of lung and bladder cancer. EXAM: DG ABDOMEN ACUTE W/ 1V CHEST COMPARISON:  PET-CT 04/21/2016.  Chest radiographs 03/31/2016. FINDINGS: A right jugular Port-A-Cath terminates over the lower SVC. The cardiac silhouette is normal in size. Aortic atherosclerosis is noted. Surgical clips are present in the right lower neck. Confluent left upper lobe opacity corresponds to the known mass and associated left upper lobe collapse. Elsewhere, the lungs are hyperinflated with evidence of emphysema. Small nodules are scattered throughout both lungs as seen on the recent PET-CT. There is no evidence of new airspace consolidation, edema, sizable pleural effusion, or pneumothorax. There is no evidence of intraperitoneal free air. Gas is present in nondilated loops of bowel without evidence of obstruction. Surgical clips are present in the central abdomen and right inguinal region. Atherosclerotic vascular calcification is noted. Multilevel disc degeneration and mild levoscoliosis are present in the lumbar spine.  IMPRESSION: 1. Left upper lobe mass and scattered bilateral lung nodules consistent with known malignancy. 2. No evidence of acute airspace disease. 3. Nonobstructed bowel gas pattern. 4. Aortic atherosclerosis. Electronically Signed   By: Logan Bores M.D.   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  Started palliative systemic chemotherapy beginning on 04/24/2016 consisting of Carboplatin/Gemcitabine in a day 1, 8 every 21  days.  Oncology history is updated.  Pre-treatment labs today: CBC diff, CMET.  I personally reviewed and went over laboratory results with the patient.  The results are noted within this dictation.  CBC today is excellent. Her metabolic panel is abnormal with a hyperkalemia, worsening creatinine, and hypercalcemia with a corrected calcium 11.6  These abnormalities, we will cancel chemotherapy today. We will correct her hypercalcemia with 1 dose of Zometa. I will reduce this dose to 3.3 mg given her renal function. Her creatinine clearance is calculated at 42. I understand for hypercalcemia there is no dose reduction, but I feel leary of stressing her kidneys any further.  She is taking oral potassium at home. I've asked her to discontinue this medication at this time.  I'll give her IV fluids at 333 cc/hr today, 1 L NS.  She is a little tachycardic as well.  She will continue with Ambien for bed. I'll start her on a low dose, long-acting pain medication, MS Contin 15 mg twice a day. She will still have tramadol for breakthrough pain management if necessary.  She is given a constipation sheet to prevent opiate-induced constipation.  She is given a diarrhea if she does well given her current loose stools.  Goals of care discussion took place again today. She remains very resistant to stopping  chemotherapy. Her son seems to be more onboard. She is reminded that she has a terminal diagnosis. She is also reminded that we want to cause no harm and therefore we have to be very selective with who we give chemotherapy to.  She is advised that performance status plays a large role in chemotherapy administration. She currently is an ECOG of 3 which is concerning. Her ongoing weight loss is concerning. Her recent laboratory abnormalities or concerning.  Return in 2 weeks for follow-up and the start of cycle #3.  If not better, we will need to discuss hospice again. It may not be unreasonable to restage her as well.    ORDERS PLACED FOR THIS ENCOUNTER: Orders Placed This Encounter  Procedures  . CBC with Differential  . Comprehensive metabolic panel    MEDICATIONS PRESCRIBED THIS ENCOUNTER: Meds ordered this encounter  Medications  . morphine (MS CONTIN) 15 MG 12 hr tablet    Sig: Take 1 tablet (15 mg total) by mouth every 12 (twelve) hours.    Dispense:  60 tablet    Refill:  0    Order Specific Question:   Supervising Provider    Answer:   Brunetta Genera [[id number]]    THERAPY PLAN:  Cancel chemotherapy today. Supportive care today.  All questions were answered. The patient knows to call the clinic with any problems, questions or concerns. We can certainly see the patient much sooner if necessary.  Patient and plan discussed with Dr. Twana First and she is in agreement with the aforementioned.   This note is electronically signed by: Doy Mince 05/22/2016 1:48 PM

## 2016-05-23 ENCOUNTER — Other Ambulatory Visit (HOSPITAL_COMMUNITY): Payer: Self-pay | Admitting: Oncology

## 2016-05-23 DIAGNOSIS — C3492 Malignant neoplasm of unspecified part of left bronchus or lung: Secondary | ICD-10-CM

## 2016-05-23 MED ORDER — ONDANSETRON 8 MG PO TBDP: 8.0000 mg | ORAL_TABLET | Freq: Three times a day (TID) | ORAL | 0 refills | Status: DC | PRN

## 2016-05-23 MED ORDER — GRANISETRON 3.1 MG/24HR TD PTCH: MEDICATED_PATCH | TRANSDERMAL | 0 refills | Status: DC

## 2016-05-27 ENCOUNTER — Other Ambulatory Visit (HOSPITAL_COMMUNITY): Payer: Self-pay | Admitting: Oncology

## 2016-05-27 DIAGNOSIS — C3492 Malignant neoplasm of unspecified part of left bronchus or lung: Secondary | ICD-10-CM

## 2016-05-27 MED ORDER — DRONABINOL 5 MG PO CAPS: 5.0000 mg | ORAL_CAPSULE | Freq: Two times a day (BID) | ORAL | 1 refills | Status: AC

## 2016-05-29 ENCOUNTER — Encounter (HOSPITAL_COMMUNITY): Payer: Medicare Other

## 2016-05-29 ENCOUNTER — Telehealth (HOSPITAL_COMMUNITY): Payer: Self-pay

## 2016-05-29 DIAGNOSIS — C3492 Malignant neoplasm of unspecified part of left bronchus or lung: Secondary | ICD-10-CM | POA: Diagnosis not present

## 2016-05-29 DIAGNOSIS — D649 Anemia, unspecified: Secondary | ICD-10-CM | POA: Diagnosis not present

## 2016-05-29 LAB — COMPREHENSIVE METABOLIC PANEL
ALBUMIN: 3 g/dL — AB (ref 3.5–5.0)
ALK PHOS: 82 U/L (ref 38–126)
ALT: 13 U/L — AB (ref 14–54)
ANION GAP: 12 (ref 5–15)
AST: 71 U/L — ABNORMAL HIGH (ref 15–41)
BUN: 30 mg/dL — ABNORMAL HIGH (ref 6–20)
CALCIUM: 8.4 mg/dL — AB (ref 8.9–10.3)
CO2: 25 mmol/L (ref 22–32)
CREATININE: 1.61 mg/dL — AB (ref 0.44–1.00)
Chloride: 93 mmol/L — ABNORMAL LOW (ref 101–111)
GFR calc Af Amer: 35 mL/min — ABNORMAL LOW (ref >60)
GFR calc non Af Amer: 30 mL/min — ABNORMAL LOW (ref >60)
GLUCOSE: 124 mg/dL — AB (ref 65–99)
Potassium: 3.8 mmol/L (ref 3.5–5.1)
Sodium: 130 mmol/L — ABNORMAL LOW (ref 135–145)
TOTAL PROTEIN: 7.6 g/dL (ref 6.5–8.1)
Total Bilirubin: 0.7 mg/dL (ref 0.3–1.2)

## 2016-05-29 LAB — CBC WITH DIFFERENTIAL/PLATELET
BASOS PCT: 0 %
Basophils Absolute: 0 10*3/uL (ref 0.0–0.1)
Eosinophils Absolute: 0 10*3/uL (ref 0.0–0.7)
Eosinophils Relative: 0 %
HEMATOCRIT: 36.4 % (ref 36.0–46.0)
HEMOGLOBIN: 12.1 g/dL (ref 12.0–15.0)
LYMPHS ABS: 0.6 10*3/uL — AB (ref 0.7–4.0)
Lymphocytes Relative: 7 %
MCH: 28.1 pg (ref 26.0–34.0)
MCHC: 33.2 g/dL (ref 30.0–36.0)
MCV: 84.7 fL (ref 78.0–100.0)
MONOS PCT: 14 %
Monocytes Absolute: 1.2 10*3/uL — ABNORMAL HIGH (ref 0.1–1.0)
NEUTROS ABS: 7 10*3/uL (ref 1.7–7.7)
NEUTROS PCT: 79 %
Platelets: 116 10*3/uL — ABNORMAL LOW (ref 150–400)
RBC: 4.3 MIL/uL (ref 3.87–5.11)
RDW: 15.5 % (ref 11.5–15.5)
WBC: 8.9 10*3/uL (ref 4.0–10.5)

## 2016-05-29 NOTE — Telephone Encounter (Signed)
Patient had labs drawn today. Reviewed with PA-C. Patient scheduled for hydration tomorrow daughter notified and verbalized understanding.

## 2016-05-30 ENCOUNTER — Encounter (HOSPITAL_COMMUNITY): Payer: Self-pay

## 2016-05-30 ENCOUNTER — Encounter (HOSPITAL_BASED_OUTPATIENT_CLINIC_OR_DEPARTMENT_OTHER): Payer: Medicare Other

## 2016-05-30 ENCOUNTER — Other Ambulatory Visit (HOSPITAL_COMMUNITY): Payer: Self-pay

## 2016-05-30 DIAGNOSIS — E860 Dehydration: Secondary | ICD-10-CM | POA: Diagnosis present

## 2016-05-30 DIAGNOSIS — C3412 Malignant neoplasm of upper lobe, left bronchus or lung: Secondary | ICD-10-CM

## 2016-05-30 MED ORDER — SODIUM CHLORIDE 0.9 % IV SOLN
INTRAVENOUS | Status: DC
  Administered 2016-05-30: 10:00:00 via INTRAVENOUS

## 2016-05-30 MED ORDER — HEPARIN SOD (PORK) LOCK FLUSH 10 UNIT/ML IV SOLN: 10.0000 [IU] | Freq: Once | INTRAVENOUS | Status: DC

## 2016-05-30 MED ORDER — HEPARIN SOD (PORK) LOCK FLUSH 100 UNIT/ML IV SOLN
500.0000 [IU] | Freq: Once | INTRAVENOUS | Status: AC
  Administered 2016-05-30: 500 [IU] via INTRAVENOUS

## 2016-05-30 MED ORDER — HEPARIN SOD (PORK) LOCK FLUSH 100 UNIT/ML IV SOLN
INTRAVENOUS | Status: AC
  Filled 2016-05-30: qty 5

## 2016-05-30 NOTE — Progress Notes (Signed)
One liter of hydration fluids given per orders. Vitals stable and discharged home from clinic via wheelchair with family. Follow up as scheduled.

## 2016-05-30 NOTE — Patient Instructions (Signed)
[place] at [facility] Discharge Instructions  RECOMMENDATIONS MADE BY THE CONSULTANT AND ANY TEST RESULTS WILL BE SENT TO YOUR REFERRING PHYSICIAN.  One liter of fluids given Follow up as scheduled.  Thank you for choosing [place] at [facility] to provide your oncology and hematology care.  To afford each patient quality time with our provider, please arrive at least 15 minutes before your scheduled appointment time.    If you have a lab appointment with the [facility] please come in thru the  Main Entrance and check in at the main information desk  You need to re-schedule your appointment should you arrive 10 or more minutes late.  We strive to give you quality time with our providers, and arriving late affects you and other patients whose appointments are after yours.  Also, if you no show three or more times for appointments you may be dismissed from the clinic at the providers discretion.     Again, thank you for choosing [facility].  Our hope is that these requests will decrease the amount of time that you wait before being seen by our physicians.       _____________________________________________________________  Should you have questions after your visit to [facility], please contact our office at [phone] between the hours of 8:30 a.m. and 4:30 p.m.  Voicemails left after 4:30 p.m. will not be returned until the following business day.  For prescription refill requests, have your pharmacy contact our office.       Resources For Cancer Patients and their Caregivers ? American Cancer Society: Can assist with transportation, wigs, general needs, runs Look Good Feel Better.        ([phone] ? Cancer Care: Provides financial assistance, online support groups, medication/co-pay assistance.  1-800-813-HOPE (501) 346-8999) ? [place] Assists [place] cancer patients  and their families through emotional , educational and financial support.  530-116-1771 ? [place] DSS Where to apply for food stamps, Medicaid and utility assistance. 269-584-6699 ? RCATS: Transportation to medical appointments. 631-095-9106 ? Social Security Administration: May apply for disability if have a Stage IV cancer. 606-103-0243 (904)127-6307 ? [employer], Disability and Transit Services: Assists with nutrition, care and transit needs. [place] Support Programs: '@10RELATIVEDAYS'$ @ > Cancer Support Group  2nd Tuesday of the month 1pm-2pm, Journey Room  > Creative Journey  3rd Tuesday of the month 1130am-1pm, Journey Room  > Look Good Feel Better  1st Wednesday of the month 10am-12 noon, Journey Room (Call [place] to register [phone])

## 2016-06-04 ENCOUNTER — Other Ambulatory Visit (HOSPITAL_COMMUNITY): Payer: Self-pay

## 2016-06-04 DIAGNOSIS — C3492 Malignant neoplasm of unspecified part of left bronchus or lung: Secondary | ICD-10-CM

## 2016-06-04 MED ORDER — GRANISETRON 3.1 MG/24HR TD PTCH: MEDICATED_PATCH | TRANSDERMAL | 1 refills | Status: AC

## 2016-06-04 NOTE — Progress Notes (Signed)
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   BRIEF ONCOLOGIC HISTORY:    Squamous cell carcinoma lung, left ([place])   03/31/2016 Initial Diagnosis    Squamous cell carcinoma lung, left ([place])     04/21/2016 PET scan    1. Primary 7.7 by 5.0 cm left upper lobe mass occludes the left upper lobe bronchus and has a maximum SUV of 22.8. Scattered bilateral pulmonary nodules are hypermetabolic indicating metastatic disease. Considerable mediastinal and hilar pathologic adenopathy. Faintly activity in the adrenal glands could represent early metastatic disease to the adrenals. 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 2. There is a small hypermetabolic focus along the right posterior glottic level in the vicinity of the right arytenoid cartilage without CT correlate, maximum SUV 6.3. Given the lack of CT correlate this may simply be physiologic but is focally prominent and may merit attention on follow up studies.      04/21/2016 Pathology Results    Guardant360- Alterations detected: TP53. SMO, MYC, RB1, BRCA1, GATA3, and MET.  ALK/EGFR NEGATIVE.      04/24/2016 -  Chemotherapy    The patient had palonosetron (ALOXI) injection 0.25 mg, 0.25 mg, Intravenous,  Once, 2 of 4 cycles  CARBOplatin (PARAPLATIN) 240 mg in sodium chloride 0.9 % 250 mL chemo infusion, 240 mg (100 % of original dose 241 mg), Intravenous,  Once, 2 of 4 cycles Dose modification:   (original dose 241 mg, Cycle 1)  gemcitabine (GEMZAR) 1,026 mg in sodium chloride 0.9 % 250 mL chemo infusion, 750 mg/m2 = 1,026 mg  (100 % of original dose 750 mg/m2), Intravenous,  Once, 2 of 4 cycles Dose modification: 750 mg/m2 (original dose 750 mg/m2, Cycle 1, Reason: Provider Judgment)  for chemotherapy treatment.          HISTORY OF PRESENT ILLNESS:  Reportedly, she did not tolerate cycle #1 of chemotherapy; this was despite a 25% dose-reduction in Gemcitabine. She required IVF for dehydration and symptomatic hypotension; BP responded well to IVF and holding her anti-HTN medications.  She also required 2 units PRBCs for symptomatic anemia with Hgb 8.3 on 05/02/16.  Then on 05/07/16 she reported to the emergency room with black tarry stools. She was noted to be hypotensive in the emergency room, with significant thrombocytopenia. She underwent EGD, which showed an oozing duodenal ulcer that was clipped; she received Protonix infusion for 72 hours and was transitioned to twice daily oral Protonix. She received an additional 2 units PRBCs while hospitalized. She also reportedly received 1 unit of platelets as well. She was noted on hospital discharge summary to be DNR.   Outpatient DNR confirmed on 05/15/16 and orders placed.  There have been extensive and continued conversations regarding goals of care for this patient.      INTERVAL HISTORY:  She presents to cancer center with her daughter and son for consideration of additional chemotherapy.   She is seen lying in infusion area bed for today's visit.   Her last chemotherapy of Gemzar/Carbo was given on 05/15/16 and was dose-reduced 25%. Her day 8 of chemotherapy was held d/t  electrolyte abnormalities with hypercalcemia, hyperkalemia, worsening kidney function, and overall performance status.  She received IV fluids & Zometa on 05/22/16 in lieu of chemotherapy.  On 05/30/16, she returned to cancer center for subsequent IV fluids.   Ms. Vandehei is very weak and lethargic today. It is difficult to have a conversation with her during our encounter as she is very drowsy.  She is  able to verbalize feeling very weak.  Denies any chest pain or other pain at present.  Daughter states that yesterday she spent the entire day in bed; the day before yesterday she was "up and about" per daughter; daughter states she has been eating a bit more in recent days.   Current weight today is 87 lbs.  RN noted patient to have elevated BP 183/104 with pulse 120s.  Given patient's lethargy and irregular heart rhythm on exam, stat EKG was collected at bedside.    REVIEW OF SYSTEMS: (limited ROS given patient's lethargy) Review of Systems  Constitutional: Positive for fatigue.  Respiratory: Negative.   Cardiovascular: Negative for chest pain.  Gastrointestinal: Positive for nausea.  Genitourinary: Negative for dysuria, hematuria and vaginal bleeding.   Musculoskeletal: Positive for gait problem.  Neurological: Positive for dizziness, extremity weakness, gait problem and light-headedness.  Hematological: Negative for adenopathy.     PAST MEDICAL/SURGICAL HISTORY:  Past Medical History:  Diagnosis Date  . Cancer ([place])    bladder  . DNR (do not resuscitate)   . Elevated transaminase level   . Goals of care, counseling/discussion 05/01/2016  . H/O renal artery stenosis   . Hyperlipidemia   . Hypertension   . Osteoporosis   . Squamous cell carcinoma lung, left ([facility]) 03/31/2016   Past Surgical History:  Procedure Laterality Date  . AORTIC VALVULOPLASTY  1995  . CAROTID ENDARTERECTOMY Right   . COLONOSCOPY  10/2004   [place]: minimal internal hemorrhoids, otherwise normal colon. next exam 2016  . ESOPHAGOGASTRODUODENOSCOPY N/A 05/08/2016   Procedure: ESOPHAGOGASTRODUODENOSCOPY (EGD);  Surgeon: Daneil Dolin, MD;  Location: [facility];  Service: Endoscopy;  Laterality: N/A;  . FLEXIBLE BRONCHOSCOPY Bilateral 04/02/2016   Procedure: FLEXIBLE BRONCHOSCOPY;  Surgeon: Sinda Du, MD;  Location: [facility];  Service: Thoracic;  Laterality: Bilateral;  . IR GENERIC HISTORICAL   04/16/2016   IR FLUORO GUIDE PORT INSERTION RIGHT 04/16/2016 Markus Daft, MD [facility]  . IR GENERIC HISTORICAL  04/16/2016   IR US GUIDE VASC ACCESS RIGHT 04/16/2016 Markus Daft, MD [facility]  . [place]   "blockages" - ?stenting  . TUBAL LIGATION       SOCIAL HISTORY:  Social History   Social History  . Marital status: Married    Spouse name: N/A  . Number of children: N/A  . Years of education: N/A   Occupational History  . 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 Smoking status: Current Every Day Smoker    Packs/day: 1.00    Types: Cigarettes  . Smokeless tobacco: Never Used     Comment: currently only 4 cigs/day  . Alcohol use No  . Drug use: No  . Sexual activity: Not on file     Comment: married   Other Topics Concern  . Not on file   Social History Narrative  . No narrative on file    FAMILY HISTORY:  Family History  Problem Relation Age of Onset  . Hypertension Sister   . Hypertension Other   .  Stroke Other   . Heart attack Other   . Dementia Mother 47    significant ASCVD  . Lung cancer Father 28  . Brain cancer Brother 65    CURRENT MEDICATIONS:  Outpatient Encounter Prescriptions as of 06/05/2016  Medication Sig Note  . acetaminophen (TYLENOL) 500 MG tablet Take 1,000 mg by mouth every 6 (six) hours as needed for moderate pain.   Marland Kitchen alendronate (FOSAMAX) 70 MG tablet Take 1 tablet (70 mg total) by mouth every 7 (seven) days. Take with a full glass of water on an empty stomach sitting upright.   Marland Kitchen atenolol (TENORMIN) 50 MG tablet TAKE (1) TABLET BY MOUTH TWICE DAILY. 04/16/2016: Pt states takes whole tab in am (54ms) then takes 1/2 tab in evening (256m  . calcium-vitamin D (OSCAL WITH D) 500-200 MG-UNIT per tablet Take 1 tablet by mouth daily.    . Marland KitchenARBOPLATIN IV Inject into the vein.   . Marland Kitcheno-enzyme Q-10 30 MG capsule Take 100 mg by mouth daily.   . Cyanocobalamin (B-12) 250 MCG TABS Take 1 tablet by mouth daily.    . Marland Kitchendronabinol (MARINOL) 5 MG capsule Take 1 capsule (5 mg total) by mouth 2 (two) times daily before a meal.   . Gemcitabine HCl (GEMZAR IV) Inject into the vein.   . Marland Kitchenranisetron (SANCUSO) 3.1 MG/24HR Apply to skin starting 24 hours before chemotherapy. Remove after 7 days. Reapply.   . lidocaine-prilocaine (EMLA) cream Apply a quarter size amount to affected area 1 hour prior to coming to chemotherapy. Cover with plastic.   . Marland Kitchenisinopril-hydrochlorothiazide (PRINZIDE,ZESTORETIC) 10-12.5 MG tablet Take 1 tablet by mouth daily.   . megestrol (MEGACE) 400 MG/10ML suspension Place 10 mLs (400 mg total) into feeding tube 2 (two) times daily. (Patient taking differently: Take 400 mg by mouth 2 (two) times daily. )   . morphine (MS CONTIN) 15 MG 12 hr tablet Take 1 tablet (15 mg total) by mouth every 12 (twelve) hours.   . multivitamin-lutein (OCUVITE-LUTEIN) CAPS capsule Take 1 capsule by mouth daily.   . pantoprazole (PROTONIX) 40 MG tablet Take 1 tablet (40 mg total) by mouth 2 (two) times daily before a meal.   . potassium chloride SA (K-DUR,KLOR-CON) 20 MEQ tablet Take 2 tablets (40 mEq total) by mouth daily.   . pravastatin (PRAVACHOL) 20 MG tablet Take 1 tablet (20 mg total) by mouth daily.   . prochlorperazine (COMPAZINE) 10 MG tablet Take 1 tablet (10 mg total) by mouth every 6 (six) hours as needed for nausea or vomiting.   . sennosides-docusate sodium (SENOKOT-S) 8.6-50 MG tablet Take 1 tablet by mouth daily.   . traMADol (ULTRAM) 50 MG tablet Take 1-2 tablets (50-100 mg total) by mouth every 6 (six) hours as needed for moderate pain or severe pain.   . Marland Kitchenolpidem (AMBIEN) 5 MG tablet Take 1 tablet (5 mg total) by mouth at bedtime as needed for sleep.    No facility-administered encounter medications on file as of 06/05/2016.     ALLERGIES:  Allergies  Allergen Reactions  . Penicillins Other (See Comments)    Childhood allergy. Has patient had a PCN reaction causing immediate rash,  facial/tongue/throat swelling, SOB or lightheadedness with hypotension: unknown Has patient had a PCN reaction causing severe rash involving mucus membranes or skin necrosis: unknown Has patient had a PCN reaction that required hospitalization: unknown Has patient had a PCN reaction occurring within the last 10 years: unknown If all of the above answers are "NO", then  may proceed with Cephalosporin use.   . Sulfa Antibiotics Other (See Comments)    unknown     PHYSICAL EXAM:  Performance status: Currently 4 - very sick; requires active supportive treatment.      Physical Exam  Constitutional:  Frail, thin, cachetic, & lethargic  HENT:  Head: Normocephalic.  Eyes: Conjunctivae are normal. No scleral icterus.  Neck: Normal range of motion. Neck supple.  Cardiovascular: An irregular rhythm present. Tachycardia present.   Stat EKG pending   Pulmonary/Chest:  Breaths shallow  Musculoskeletal: She exhibits no edema.  Lymphadenopathy:    She has no cervical adenopathy.  Skin: Skin is warm and dry. There is pallor.     LABORATORY DATA:  I have reviewed the labs as listed.  CBC    Component Value Date/Time   WBC 8.9 05/29/2016 0913   RBC 4.30 05/29/2016 0913   HGB 12.1 05/29/2016 0913   HCT 36.4 05/29/2016 0913   PLT 116 (L) 05/29/2016 0913   MCV 84.7 05/29/2016 0913   MCH 28.1 05/29/2016 0913   MCHC 33.2 05/29/2016 0913   RDW 15.5 05/29/2016 0913   LYMPHSABS 0.6 (L) 05/29/2016 0913   MONOABS 1.2 (H) 05/29/2016 0913   EOSABS 0.0 05/29/2016 0913   BASOSABS 0.0 05/29/2016 0913   CMP Latest Ref Rng & Units 05/29/2016 05/22/2016 05/15/2016  Glucose 65 - 99 mg/dL 124(H) 149(H) 233(H)  BUN 6 - 20 mg/dL 30(H) 32(H) 20  Creatinine 0.44 - 1.00 mg/dL 1.61(H) 1.69(H) 1.19(H)  Sodium 135 - 145 mmol/L 130(L) 130(L) 130(L)  Potassium 3.5 - 5.1 mmol/L 3.8 5.5(H) 2.9(L)  Chloride 101 - 111 mmol/L 93(L) 98(L) 97(L)  CO2 22 - 32 mmol/L '25 22 23  ' Calcium 8.9 - 10.3 mg/dL 8.4(L) 10.9(H)  8.1(L)  Total Protein 6.5 - 8.1 g/dL 7.6 7.6 6.2(L)  Total Bilirubin 0.3 - 1.2 mg/dL 0.7 1.0 0.6  Alkaline Phos 38 - 126 U/L 82 61 53  AST 15 - 41 U/L 71(H) 71(H) 50(H)  ALT 14 - 54 U/L 13(L) 16 13(L)    PENDING LABS:    DIAGNOSTIC IMAGING:  PET scan: 04/21/16   EKG done in office today: 06/05/16     PATHOLOGY:  LUL lung biopsy: 04/02/16       ASSESSMENT & PLAN:  Ms. Lavergne is a 76 y.o. female with Stage IVA ([id number]) Squamous cell carcinoma of the left upper lobe. Being treated currently with Carbo/Gemzar with 25% dose reduction in gemcitabine for cycle 1. She presents today for continued follow-up and treatment consideration.   Stage IVA lung cancer: -I discussed the patient's case with Dr. Talbert Cage.  Given Ms. Elmore current condition and performance status, further chemotherapy is not recommended.  I shared these recommendations with Ms. Hocutt and her family (son and daughter) who were at bedside at time of visit.  Further treatment would do harm given her frail and cachetic state.  We recommend initiating hospice at home or at inpatient hospice house.   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  -Discussed findings with Dr. Talbert Cage and she recommended the patient be evaluated in the ED today. I called and discussed the patient's case with Dr. Sabra Heck, [facility] ED physician. She was transported to ED via nursing staff.   Goals of care discussion:  -Ms. Fries's condition has continued to decline.  After discussion with Dr. Talbert Cage, there are no additional treatment options that would be appropriate  for this patient given her current health/performance status.  Ms. Hammond has expressed very clearly in past encounters her wishes to be home with her family and not have to come to the hospital or cancer center for interventions.  We discussed the option of initiating home hospice vs inpatient hospice.  The patient was unable to verbalize much of her wishes today given her lethargy. Her daughter vocalized that the patient would want to be at home if there are no more treatment options for her.  The patient and her family were not ready to commit to having hospice come to the home to initiate their services.  I reiterated that hospice will align with her previously stated goals of being at home, and they will help provide interventions to promote comfort.  In a moment of lucidity with the patient today, she was able to verbalize that continues to not want CPR or intubation in the event her heart stops or she stops breathing; her family affirms these wishes.  She remains DNR/DNI; orders previously signed and are in place at the time of this encounter. Encouraged the patient and family to reach out to [place] via phone if they decide they would like hospice services initiated; we would be happy to place this referral. We will wait to hear from the family regarding their final decision regarding hospice.   Code status: DNR/DNI; confirmed with patient and family today.    Dispo:  -ED for evaluation today given heart block noted on EKG.   -Recommended initiation of hospice services; patient/family will contact us when they decide and we are happy to initiate those orders.    All questions were answered to patient's stated satisfaction. Encouraged patient to call with any new concerns or questions before her next visit to the cancer center and we can certain see her sooner, if needed.    Plan of care discussed with Dr. Talbert Cage, who believes with the above aforementioned.    Mike Craze, NP [place] [phone]

## 2016-06-05 ENCOUNTER — Encounter (HOSPITAL_COMMUNITY): Payer: Self-pay | Admitting: Emergency Medicine

## 2016-06-05 ENCOUNTER — Emergency Department (HOSPITAL_COMMUNITY): Payer: Medicare Other

## 2016-06-05 ENCOUNTER — Other Ambulatory Visit: Payer: Self-pay

## 2016-06-05 ENCOUNTER — Encounter (HOSPITAL_COMMUNITY): Payer: Medicare Other

## 2016-06-05 ENCOUNTER — Encounter (HOSPITAL_COMMUNITY): Payer: Self-pay | Admitting: Adult Health

## 2016-06-05 ENCOUNTER — Emergency Department (HOSPITAL_COMMUNITY)
Admission: EM | Admit: 2016-06-05 | Discharge: 2016-06-05 | Disposition: A | Discharge status: Home / Self Care | Payer: Medicare Other | Attending: Emergency Medicine | Admitting: Emergency Medicine

## 2016-06-05 ENCOUNTER — Encounter (HOSPITAL_BASED_OUTPATIENT_CLINIC_OR_DEPARTMENT_OTHER): Payer: Medicare Other | Admitting: Adult Health

## 2016-06-05 VITALS — BP 183/104 | HR 120 | Resp 20 | Wt 87.2 lb

## 2016-06-05 DIAGNOSIS — D649 Anemia, unspecified: Secondary | ICD-10-CM | POA: Diagnosis not present

## 2016-06-05 DIAGNOSIS — Z8551 Personal history of malignant neoplasm of bladder: Secondary | ICD-10-CM | POA: Insufficient documentation

## 2016-06-05 DIAGNOSIS — C3492 Malignant neoplasm of unspecified part of left bronchus or lung: Secondary | ICD-10-CM

## 2016-06-05 DIAGNOSIS — I10 Essential (primary) hypertension: Secondary | ICD-10-CM | POA: Insufficient documentation

## 2016-06-05 DIAGNOSIS — E876 Hypokalemia: Secondary | ICD-10-CM | POA: Insufficient documentation

## 2016-06-05 DIAGNOSIS — C3412 Malignant neoplasm of upper lobe, left bronchus or lung: Secondary | ICD-10-CM | POA: Diagnosis not present

## 2016-06-05 DIAGNOSIS — F17210 Nicotine dependence, cigarettes, uncomplicated: Secondary | ICD-10-CM | POA: Insufficient documentation

## 2016-06-05 DIAGNOSIS — R000 Tachycardia, unspecified: Secondary | ICD-10-CM | POA: Diagnosis not present

## 2016-06-05 DIAGNOSIS — Z79899 Other long term (current) drug therapy: Secondary | ICD-10-CM | POA: Diagnosis not present

## 2016-06-05 DIAGNOSIS — I459 Conduction disorder, unspecified: Secondary | ICD-10-CM

## 2016-06-05 DIAGNOSIS — R918 Other nonspecific abnormal finding of lung field: Secondary | ICD-10-CM | POA: Diagnosis not present

## 2016-06-05 DIAGNOSIS — R531 Weakness: Secondary | ICD-10-CM

## 2016-06-05 LAB — COMPREHENSIVE METABOLIC PANEL
ALT: 12 U/L — ABNORMAL LOW (ref 14–54)
ALT: 11 U/L — AB (ref 14–54)
AST: 71 U/L — ABNORMAL HIGH (ref 15–41)
AST: 68 U/L — ABNORMAL HIGH (ref 15–41)
Albumin: 2.9 g/dL — ABNORMAL LOW (ref 3.5–5.0)
Albumin: 2.8 g/dL — ABNORMAL LOW (ref 3.5–5.0)
Alkaline Phosphatase: 67 U/L (ref 38–126)
Alkaline Phosphatase: 65 U/L (ref 38–126)
Anion gap: 17 — ABNORMAL HIGH (ref 5–15)
Anion gap: 17 — ABNORMAL HIGH (ref 5–15)
BILIRUBIN TOTAL: 1.4 mg/dL — AB (ref 0.3–1.2)
BUN: 27 mg/dL — ABNORMAL HIGH (ref 6–20)
BUN: 27 mg/dL — AB (ref 6–20)
CHLORIDE: 94 mmol/L — AB (ref 101–111)
CO2: 24 mmol/L (ref 22–32)
CO2: 24 mmol/L (ref 22–32)
CREATININE: 1.98 mg/dL — AB (ref 0.44–1.00)
Calcium: 7.5 mg/dL — ABNORMAL LOW (ref 8.9–10.3)
Calcium: 7.2 mg/dL — ABNORMAL LOW (ref 8.9–10.3)
Chloride: 93 mmol/L — ABNORMAL LOW (ref 101–111)
Creatinine, Ser: 2.04 mg/dL — ABNORMAL HIGH (ref 0.44–1.00)
GFR calc Af Amer: 26 mL/min — ABNORMAL LOW (ref >60)
GFR calc non Af Amer: 23 mL/min — ABNORMAL LOW (ref >60)
GFR, EST AFRICAN AMERICAN: 27 mL/min — AB (ref >60)
GFR, EST NON AFRICAN AMERICAN: 24 mL/min — AB (ref >60)
Glucose, Bld: 122 mg/dL — ABNORMAL HIGH (ref 65–99)
Glucose, Bld: 132 mg/dL — ABNORMAL HIGH (ref 65–99)
POTASSIUM: 3 mmol/L — AB (ref 3.5–5.1)
Potassium: 3.1 mmol/L — ABNORMAL LOW (ref 3.5–5.1)
Sodium: 134 mmol/L — ABNORMAL LOW (ref 135–145)
Sodium: 135 mmol/L (ref 135–145)
TOTAL PROTEIN: 7.6 g/dL (ref 6.5–8.1)
Total Bilirubin: 1.2 mg/dL (ref 0.3–1.2)
Total Protein: 7.6 g/dL (ref 6.5–8.1)

## 2016-06-05 LAB — CBC WITH DIFFERENTIAL/PLATELET
BASOS ABS: 0 10*3/uL (ref 0.0–0.1)
BASOS ABS: 0 10*3/uL (ref 0.0–0.1)
BASOS PCT: 0 %
Basophils Relative: 0 %
EOS ABS: 0 10*3/uL (ref 0.0–0.7)
EOS PCT: 0 %
EOS PCT: 0 %
Eosinophils Absolute: 0 10*3/uL (ref 0.0–0.7)
HCT: 37.4 % (ref 36.0–46.0)
HCT: 35.4 % — ABNORMAL LOW (ref 36.0–46.0)
HEMOGLOBIN: 11.9 g/dL — AB (ref 12.0–15.0)
Hemoglobin: 12.5 g/dL (ref 12.0–15.0)
LYMPHS ABS: 0.3 10*3/uL — AB (ref 0.7–4.0)
Lymphocytes Relative: 7 %
Lymphocytes Relative: 3 %
Lymphs Abs: 0.8 10*3/uL (ref 0.7–4.0)
MCH: 28.2 pg (ref 26.0–34.0)
MCH: 28.4 pg (ref 26.0–34.0)
MCHC: 33.4 g/dL (ref 30.0–36.0)
MCHC: 33.6 g/dL (ref 30.0–36.0)
MCV: 84.4 fL (ref 78.0–100.0)
MCV: 84.5 fL (ref 78.0–100.0)
MONO ABS: 1.3 10*3/uL — AB (ref 0.1–1.0)
Monocytes Absolute: 1.3 10*3/uL — ABNORMAL HIGH (ref 0.1–1.0)
Monocytes Relative: 11 %
Monocytes Relative: 10 %
NEUTROS PCT: 87 %
Neutro Abs: 9.6 10*3/uL — ABNORMAL HIGH (ref 1.7–7.7)
Neutro Abs: 11.3 10*3/uL — ABNORMAL HIGH (ref 1.7–7.7)
Neutrophils Relative %: 82 %
PLATELETS: 118 10*3/uL — AB (ref 150–400)
PLATELETS: 122 10*3/uL — AB (ref 150–400)
RBC: 4.43 MIL/uL (ref 3.87–5.11)
RBC: 4.19 MIL/uL (ref 3.87–5.11)
RDW: 16.9 % — AB (ref 11.5–15.5)
RDW: 16.9 % — ABNORMAL HIGH (ref 11.5–15.5)
WBC: 11.6 10*3/uL — ABNORMAL HIGH (ref 4.0–10.5)
WBC: 13 10*3/uL — AB (ref 4.0–10.5)

## 2016-06-05 LAB — URINALYSIS, ROUTINE W REFLEX MICROSCOPIC
BACTERIA UA: NONE SEEN
Bilirubin Urine: NEGATIVE
Glucose, UA: 50 mg/dL — AB
Ketones, ur: 20 mg/dL — AB
LEUKOCYTES UA: NEGATIVE
Nitrite: NEGATIVE
PROTEIN: 100 mg/dL — AB
SPECIFIC GRAVITY, URINE: 1.011 (ref 1.005–1.030)
pH: 5 (ref 5.0–8.0)

## 2016-06-05 LAB — I-STAT CG4 LACTIC ACID, ED: LACTIC ACID, VENOUS: 1.33 mmol/L (ref 0.5–1.9)

## 2016-06-05 MED ORDER — POTASSIUM CHLORIDE CRYS ER 20 MEQ PO TBCR: 40.0000 meq | EXTENDED_RELEASE_TABLET | Freq: Every day | ORAL | 3 refills | Status: AC

## 2016-06-05 MED ORDER — SODIUM CHLORIDE 0.9 % IV BOLUS (SEPSIS)
1000.0000 mL | Freq: Once | INTRAVENOUS | Status: AC
  Administered 2016-06-05: 1000 mL via INTRAVENOUS

## 2016-06-05 MED ORDER — HEPARIN SOD (PORK) LOCK FLUSH 100 UNIT/ML IV SOLN
500.0000 [IU] | Freq: Once | INTRAVENOUS | Status: AC
  Administered 2016-06-05: 500 [IU]
  Filled 2016-06-05: qty 5

## 2016-06-05 MED ORDER — SODIUM CHLORIDE 0.9 % IV SOLN
1000.0000 mL | INTRAVENOUS | Status: DC
  Administered 2016-06-05: 1000 mL via INTRAVENOUS

## 2016-06-05 NOTE — ED Triage Notes (Signed)
Pt with hx lung cancer was being seen by PA upstairs and HR was 130 today, sent down here for evaluation.  Pt denies chest pain, but has had weakness.

## 2016-06-05 NOTE — Discharge Instructions (Signed)
We have given you IV fluids, I have written a prescription for potassium replacement, it is slightly low. Please drink plenty of fluids and return to the emergency department if you should develop severe or worsening symptoms.

## 2016-06-05 NOTE — ED Provider Notes (Signed)
[facility] DEPT Provider Note   CSN: [id number] Arrival date & time: 06/05/16  4970  By signing my name below, I, Collene Leyden, attest that this documentation has been prepared under the direction and in the presence of Noemi Chapel, MD. Electronically Signed: Collene Leyden, Scribe. 06/05/16. 10:02 AM.   History   Chief Complaint Chief Complaint  Patient presents with  . Tachycardia    HPI Comments: Michelle KREISER is a 76 y.o. female with a hx of squamous cell carcinoma of the left lung, and bladder cancer, who presents to the Emergency Department complaining of gradually worsening generalized weakness that began over a month ago. Patient began feeling weak ever since she began receiving radiation on January 11th, increased weakness since yesterday. Patient has began gaining weight, she was originally 73 pounds, now 83 pounds. Patient has associated dry heaves and "wobbling" when walking. Per daughter the patient has been taking Zofran and granisetron. Patient has been urinating well, last urination was prior to ED arrival. Patient denies any fever, cough, vomiting, dysuria, frequent urination, present pain, or visual changes. Sent from oncology clinic due to tachycardia  The history is provided by the patient. No language interpreter was used.    Past Medical History:  Diagnosis Date  . Cancer ([place])    bladder  . DNR (do not resuscitate)   . Elevated transaminase level   . Goals of care, counseling/discussion 05/01/2016  . H/O renal artery stenosis   . Hyperlipidemia   . Hypertension   . Osteoporosis   . Squamous cell carcinoma lung, left ([place]) 03/31/2016    Patient Active Problem List   Diagnosis Date Noted  . Acute GI bleeding   . Acute blood loss anemia   . Thrombocytopenia ([place]) 05/08/2016  . Upper GI bleed 05/07/2016  . Goals of care, counseling/discussion 05/01/2016  . Protein-calorie malnutrition, severe 04/01/2016  . Weakness 03/31/2016  . Acute renal  failure ([place]) 03/31/2016  . Squamous cell carcinoma lung, left ([place]) 03/31/2016  . Lactic acid increased 03/31/2016  . Hyperglycemia 03/31/2016  . Anemia of chronic disease 03/31/2016  . Hypercalcemia 03/31/2016  . Insomnia 01/01/2016  . Osteoporosis 01/01/2016  . Essential hypertension, benign 01/12/2013  . Fatty liver 01/12/2013  . Hyperlipemia 01/12/2013  . Renal insufficiency 01/12/2013    Past Surgical History:  Procedure Laterality Date  . AORTIC VALVULOPLASTY  1995  . CAROTID ENDARTERECTOMY Right   . COLONOSCOPY  10/2004   [place]: minimal internal hemorrhoids, otherwise normal colon. next exam 2016  . ESOPHAGOGASTRODUODENOSCOPY N/A 05/08/2016   Procedure: ESOPHAGOGASTRODUODENOSCOPY (EGD);  Surgeon: Daneil Dolin, MD;  Location: [facility];  Service: Endoscopy;  Laterality: N/A;  . FLEXIBLE BRONCHOSCOPY Bilateral 04/02/2016   Procedure: FLEXIBLE BRONCHOSCOPY;  Surgeon: Sinda Du, MD;  Location: [facility] ENDO SUITE;  Service: Thoracic;  Laterality: Bilateral;  . IR GENERIC HISTORICAL  04/16/2016   IR FLUORO GUIDE PORT INSERTION RIGHT 04/16/2016 Markus Daft, MD [facility]-INTERV RAD  . IR GENERIC HISTORICAL  04/16/2016   IR US GUIDE VASC ACCESS RIGHT 04/16/2016 Markus Daft, MD [facility]INTER[facility]  . [place]   "blockages" - ?stenting  . TUBAL LIGATION      OB History    No data available       Home Medications    Prior to Admission medications   Medication Sig Start Date End Date Taking? Authorizing Provider  acetaminophen (TYLENOL) 500 MG tablet Take 1,000 mg by mouth every 6 (six) hours as needed for moderate pain.  Yes Historical Provider, MD  alendronate (FOSAMAX) 70 MG tablet Take 1 tablet (70 mg total) by mouth every 7 (seven) days. Take with a full glass of water on an empty stomach sitting upright. 07/03/15  Yes Kathyrn Drown, MD  calcium-vitamin D (OSCAL WITH D) 500-200 MG-UNIT per tablet Take 1 tablet by mouth daily.    Yes Historical Provider, MD  co-enzyme Q-10 30 MG  capsule Take 100 mg by mouth daily.   Yes Historical Provider, MD  Cyanocobalamin (B-12) 250 MCG TABS Take 1 tablet by mouth daily.    Yes Historical Provider, MD  dronabinol (MARINOL) 5 MG capsule Take 1 capsule (5 mg total) by mouth 2 (two) times daily before a meal. 05/27/16  Yes Baird Cancer, PA-C  granisetron (SANCUSO) 3.1 MG/24HR Apply to skin starting 24 hours before chemotherapy. Remove after 7 days. Reapply. 06/04/16  Yes Baird Cancer, PA-C  lidocaine-prilocaine (EMLA) cream Apply a quarter size amount to affected area 1 hour prior to coming to chemotherapy. Cover with plastic. 04/16/16  Yes Patrici Ranks, MD  lisinopril-hydrochlorothiazide (PRINZIDE,ZESTORETIC) 10-12.5 MG tablet Take 1 tablet by mouth daily.   Yes Historical Provider, MD  morphine (MS CONTIN) 15 MG 12 hr tablet Take 1 tablet (15 mg total) by mouth every 12 (twelve) hours. 05/22/16  Yes Manon Hilding Kefalas, PA-C  pantoprazole (PROTONIX) 40 MG tablet Take 1 tablet (40 mg total) by mouth 2 (two) times daily before a meal. 05/10/16  Yes Kathie Dike, MD  pravastatin (PRAVACHOL) 20 MG tablet Take 1 tablet (20 mg total) by mouth daily. 01/01/16  Yes Kathyrn Drown, MD  prochlorperazine (COMPAZINE) 10 MG tablet Take 1 tablet (10 mg total) by mouth every 6 (six) hours as needed for nausea or vomiting. 04/16/16  Yes Patrici Ranks, MD  sennosides-docusate sodium (SENOKOT-S) 8.6-50 MG tablet Take 1 tablet by mouth daily.   Yes Historical Provider, MD  traMADol (ULTRAM) 50 MG tablet Take 1-2 tablets (50-100 mg total) by mouth every 6 (six) hours as needed for moderate pain or severe pain. 04/28/16  Yes Manon Hilding Kefalas, PA-C  zolpidem (AMBIEN) 5 MG tablet Take 1 tablet (5 mg total) by mouth at bedtime as needed for sleep. 07/05/14  Yes Kathyrn Drown, MD  atenolol (TENORMIN) 50 MG tablet TAKE (1) TABLET BY MOUTH TWICE DAILY. Patient not taking: Reported on 06/05/2016 01/01/16   Kathyrn Drown, MD  CARBOPLATIN IV Inject into the vein.     Historical Provider, MD  Gemcitabine HCl (GEMZAR IV) Inject into the vein.    Historical Provider, MD  potassium chloride SA (K-DUR,KLOR-CON) 20 MEQ tablet Take 2 tablets (40 mEq total) by mouth daily. 06/05/16   Noemi Chapel, MD    Family History Family History  Problem Relation Age of Onset  . Hypertension Sister   . Hypertension Other   . Stroke Other   . Heart attack Other   . Dementia Mother 17    significant ASCVD  . Lung cancer Father 87  . Brain cancer Brother 106    Social History Social History  Substance Use Topics  . Smoking status: Current Every Day Smoker    Packs/day: 1.00    Types: Cigarettes  . Smokeless tobacco: Never Used     Comment: currently only 4 cigs/day  . Alcohol use No     Allergies   Penicillins and Sulfa antibiotics   Review of Systems Review of Systems  Constitutional: Negative for fever.  Eyes: Negative  for visual disturbance.  Respiratory: Negative for shortness of breath.   Gastrointestinal: Negative for vomiting.  Genitourinary: Negative for dysuria and frequency.  Neurological: Positive for weakness (generalized).  All other systems reviewed and are negative.    Physical Exam Updated Vital Signs BP (!) 189/111   Pulse (!) 124   Temp 97.8 F (36.6 C) (Oral)   Resp 20   Ht '5\' 3"'$  (1.6 m)   Wt 87 lb (39.5 kg)   SpO2 96%   BMI 15.41 kg/m   Physical Exam  Constitutional: She is oriented to person, place, and time. She appears well-developed.  HENT:  Head: Normocephalic and atraumatic.  Mouth/Throat: Oropharynx is clear and moist.  Eyes: Conjunctivae and EOM are normal. Pupils are equal, round, and reactive to light.  Neck: Normal range of motion. Neck supple.  Cardiovascular: Normal rate and normal heart sounds.   Tachycardia with irregularity, but strong pulses.   Pulmonary/Chest: Effort normal and breath sounds normal.  Abdominal: Soft. Bowel sounds are normal.  Musculoskeletal: Normal range of motion.  Decreased  muscle wasting.   Neurological: She is alert and oriented to person, place, and time.  Follows all commands, no focal weakness. Normal SLR bilaterally. Grips 5/5 equal. Cranial nerves 3-12 are normal.  Skin: Skin is warm and dry.  Psychiatric: She has a normal mood and affect.     ED Treatments / Results  DIAGNOSTIC STUDIES: Oxygen Saturation is 93% on RA, low by my interpretation.    COORDINATION OF CARE: 1:08 PM Discussed treatment plan with pt at bedside and pt agreed to plan.  Labs (all labs ordered are listed, but only abnormal results are displayed) Labs Reviewed  COMPREHENSIVE METABOLIC PANEL - Abnormal; Notable for the following:       Result Value   Potassium 3.0 (*)    Chloride 94 (*)    Glucose, Bld 132 (*)    BUN 27 (*)    Creatinine, Ser 1.98 (*)    Calcium 7.2 (*)    Albumin 2.8 (*)    AST 68 (*)    ALT 11 (*)    Total Bilirubin 1.4 (*)    GFR calc non Af Amer 24 (*)    GFR calc Af Amer 27 (*)    Anion gap 17 (*)    All other components within normal limits  CBC WITH DIFFERENTIAL/PLATELET - Abnormal; Notable for the following:    WBC 13.0 (*)    Hemoglobin 11.9 (*)    HCT 35.4 (*)    RDW 16.9 (*)    Platelets 122 (*)    Neutro Abs 11.3 (*)    Lymphs Abs 0.3 (*)    Monocytes Absolute 1.3 (*)    All other components within normal limits  URINALYSIS, ROUTINE W REFLEX MICROSCOPIC - Abnormal; Notable for the following:    APPearance HAZY (*)    Glucose, UA 50 (*)    Hgb urine dipstick SMALL (*)    Ketones, ur 20 (*)    Protein, ur 100 (*)    All other components within normal limits  CULTURE, BLOOD (ROUTINE X 2)  CULTURE, BLOOD (ROUTINE X 2)  URINE CULTURE  I-STAT CG4 LACTIC ACID, ED  I-STAT CG4 LACTIC ACID, ED    EKG ED ECG REPORT  I personally interpreted this EKG   Date: 06/05/2016   Rate: 145  Rhythm: sinus tachycardia  QRS Axis: normal  Intervals: normal  ST/T Wave abnormalities: nonspecific ST/T changes  Conduction Disutrbances:none  Narrative Interpretation:   Old EKG Reviewed: changes noted  - rate faster - ecopy seen  Radiology Dg Chest Port 1 View  Result Date: 06/05/2016 CLINICAL DATA:  Tachycardia.  History of lung cancer. EXAM: PORTABLE CHEST 1 VIEW COMPARISON:  Chest x-ray 05/07/2016 FINDINGS: The power port is stable. The cardiac silhouette, mediastinal and hilar contours are unchanged. There is moderate tortuosity and calcification of the thoracic aorta. The left upper lobe lung mass is slightly smaller when compared to the prior chest film chronic emphysematous changes and pulmonary scarring. The small metastatic pulmonary nodules identified on the prior chest CT are not well seen on the chest x-ray. No definite acute overlying pulmonary process. The bony thorax is grossly intact. IMPRESSION: Left upper lobe lung mass is smaller. Chronic emphysematous and bronchitic lung changes without definite acute overlying pulmonary process. Electronically Signed   By: Marijo Sanes M.D.   On: 06/05/2016 10:37    Procedures Procedures (including critical care time)  Medications Ordered in ED Medications  0.9 %  sodium chloride infusion (1,000 mLs Intravenous New Bag/Given 06/05/16 1033)  sodium chloride 0.9 % bolus 1,000 mL (0 mLs Intravenous Stopped 06/05/16 1228)     Initial Impression / Assessment and Plan / ED Course  I have reviewed the triage vital signs and the nursing notes.  Pertinent labs & imaging results that were available during my care of the patient were reviewed by me and considered in my medical decision making (see chart for details).     The patient was given the option for ongoing treatment and admission, she declined and states that she wants to go home to be with family. She and the family feel that her care is quite palliative at this point and they are considering hospice strongly. They have decided not to pursue any further chemotherapy given the patient's severe weakness. The tachycardia improved  significantly with IV fluids and after 1 L the patient's heart rate was around 100. She had slight hypokalemia, she was informed of this, she will be refilled on her potassium supplementation. 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 The patient and family members expressed her understanding and agreement for the plan that they proposed that she go home to be with family and that they can return should symptoms worsen. I agree with this at this time. She does not appear to have sepsis  Final Clinical Impressions(s) / ED Diagnoses   Final diagnoses:  Tachycardia  Hypokalemia  Weakness    New Prescriptions Current Discharge Medication List      I personally performed the services described in this documentation, which was scribed in my presence. The recorded information has been reviewed and is accurate.       Noemi Chapel, MD 06/05/16 [phone]

## 2016-06-05 NOTE — Pre-Procedure Instructions (Signed)
EKG-06/05/16

## 2016-06-05 NOTE — Patient Instructions (Signed)
[place] at [facility] Discharge Instructions  RECOMMENDATIONS MADE BY THE CONSULTANT AND ANY TEST RESULTS WILL BE SENT TO YOUR REFERRING PHYSICIAN.  Exam with Mike Craze, NP. We need you to have further examination in the emergency department today secondary to an abnormal EKG.    Thank you for choosing [facility] at [facility] to provide your oncology and hematology care.  To afford each patient quality time with our provider, please arrive at least 15 minutes before your scheduled appointment time.    If you have a lab appointment with the [facility] please come in thru the  Main Entrance and check in at the main information desk  You need to re-schedule your appointment should you arrive 10 or more minutes late.  We strive to give you quality time with our providers, and arriving late affects you and other patients whose appointments are after yours.  Also, if you no show three or more times for appointments you may be dismissed from the clinic at the providers discretion.     Again, thank you for choosing [facility].  Our hope is that these requests will decrease the amount of time that you wait before being seen by our physicians.       _____________________________________________________________  Should you have questions after your visit to [facility], please contact our office at [phone] between the hours of 8:30 a.m. and 4:30 p.m.  Voicemails left after 4:30 p.m. will not be returned until the following business day.  For prescription refill requests, have your pharmacy contact our office.       Resources For Cancer Patients and their Caregivers ? American Cancer Society: Can assist with transportation, wigs, general needs, runs Look Good Feel Better.        867-684-1303 ? Cancer Care: Provides financial assistance, online support groups, medication/co-pay assistance.  1-800-813-HOPE  (450)855-6229) ? [place] Assists [place] cancer patients and their families through emotional , educational and financial support.  313-416-7741 ? [place] DSS Where to apply for food stamps, Medicaid and utility assistance. 567-638-8696 ? [employer]: Transportation to medical appointments. (934)040-6629 ? Social Security Administration: May apply for disability if have a Stage IV cancer. 531-613-2163 (580) 416-9823 ? [employer], Disability and Transit Services: Assists with nutrition, care and transit needs. [place] Support Programs: '@10RELATIVEDAYS'$ @ > Cancer Support Group  2nd Tuesday of the month 1pm-2pm, Journey Room  > Creative Journey  3rd Tuesday of the month 1130am-1pm, Journey Room  > Look Good Feel Better  1st Wednesday of the month 10am-12 noon, Journey Room (Call [place] to register [phone])

## 2016-06-05 NOTE — ED Notes (Signed)
Daughter stated that her mother had not taken her blood pressure meds today and this was a normal blood pressure for her without her meds.

## 2016-06-05 NOTE — Progress Notes (Signed)
Chemo held today. Pt sent to ER after MD appt

## 2016-06-06 ENCOUNTER — Encounter (HOSPITAL_COMMUNITY): Payer: Self-pay | Admitting: Lab

## 2016-06-06 DIAGNOSIS — E785 Hyperlipidemia, unspecified: Secondary | ICD-10-CM | POA: Diagnosis not present

## 2016-06-06 DIAGNOSIS — R531 Weakness: Secondary | ICD-10-CM | POA: Diagnosis not present

## 2016-06-06 DIAGNOSIS — M810 Age-related osteoporosis without current pathological fracture: Secondary | ICD-10-CM | POA: Diagnosis not present

## 2016-06-06 DIAGNOSIS — R110 Nausea: Secondary | ICD-10-CM | POA: Diagnosis not present

## 2016-06-06 DIAGNOSIS — R5383 Other fatigue: Secondary | ICD-10-CM | POA: Diagnosis not present

## 2016-06-06 DIAGNOSIS — C3490 Malignant neoplasm of unspecified part of unspecified bronchus or lung: Secondary | ICD-10-CM | POA: Diagnosis not present

## 2016-06-06 DIAGNOSIS — R630 Anorexia: Secondary | ICD-10-CM | POA: Diagnosis not present

## 2016-06-06 DIAGNOSIS — I10 Essential (primary) hypertension: Secondary | ICD-10-CM | POA: Diagnosis not present

## 2016-06-06 NOTE — Progress Notes (Unsigned)
Referral to Hospice. Records faxed on 2/23.  They will contact daughter Jeannene Patella

## 2016-06-07 LAB — URINE CULTURE: CULTURE: NO GROWTH

## 2016-06-08 DIAGNOSIS — I10 Essential (primary) hypertension: Secondary | ICD-10-CM | POA: Diagnosis not present

## 2016-06-08 DIAGNOSIS — R5383 Other fatigue: Secondary | ICD-10-CM | POA: Diagnosis not present

## 2016-06-08 DIAGNOSIS — R531 Weakness: Secondary | ICD-10-CM | POA: Diagnosis not present

## 2016-06-08 DIAGNOSIS — E785 Hyperlipidemia, unspecified: Secondary | ICD-10-CM | POA: Diagnosis not present

## 2016-06-08 DIAGNOSIS — M810 Age-related osteoporosis without current pathological fracture: Secondary | ICD-10-CM | POA: Diagnosis not present

## 2016-06-08 DIAGNOSIS — C3490 Malignant neoplasm of unspecified part of unspecified bronchus or lung: Secondary | ICD-10-CM | POA: Diagnosis not present

## 2016-06-09 ENCOUNTER — Telehealth: Payer: Self-pay | Admitting: Family Medicine

## 2016-06-09 DIAGNOSIS — R531 Weakness: Secondary | ICD-10-CM | POA: Diagnosis not present

## 2016-06-09 DIAGNOSIS — I10 Essential (primary) hypertension: Secondary | ICD-10-CM | POA: Diagnosis not present

## 2016-06-09 DIAGNOSIS — R5383 Other fatigue: Secondary | ICD-10-CM | POA: Diagnosis not present

## 2016-06-09 DIAGNOSIS — C3490 Malignant neoplasm of unspecified part of unspecified bronchus or lung: Secondary | ICD-10-CM | POA: Diagnosis not present

## 2016-06-09 DIAGNOSIS — M810 Age-related osteoporosis without current pathological fracture: Secondary | ICD-10-CM | POA: Diagnosis not present

## 2016-06-09 DIAGNOSIS — E785 Hyperlipidemia, unspecified: Secondary | ICD-10-CM | POA: Diagnosis not present

## 2016-06-09 NOTE — Telephone Encounter (Signed)
Michelle Mendoza at [facility] called requesting verbal from standing orders.  She is needing oxycodone 5 mg every 2 hours and lorezpam .5 mg every 3 hours as needed from standing orders.

## 2016-06-09 NOTE — Telephone Encounter (Signed)
I discussed the case with the hospice nurse we are perfectly fine with this approach

## 2016-06-10 DIAGNOSIS — R531 Weakness: Secondary | ICD-10-CM | POA: Diagnosis not present

## 2016-06-10 DIAGNOSIS — M810 Age-related osteoporosis without current pathological fracture: Secondary | ICD-10-CM | POA: Diagnosis not present

## 2016-06-10 DIAGNOSIS — C3490 Malignant neoplasm of unspecified part of unspecified bronchus or lung: Secondary | ICD-10-CM | POA: Diagnosis not present

## 2016-06-10 DIAGNOSIS — R5383 Other fatigue: Secondary | ICD-10-CM | POA: Diagnosis not present

## 2016-06-10 DIAGNOSIS — I10 Essential (primary) hypertension: Secondary | ICD-10-CM | POA: Diagnosis not present

## 2016-06-10 DIAGNOSIS — E785 Hyperlipidemia, unspecified: Secondary | ICD-10-CM | POA: Diagnosis not present

## 2016-06-10 LAB — CULTURE, BLOOD (ROUTINE X 2)
CULTURE: NO GROWTH
Culture: NO GROWTH

## 2016-06-12 DEATH — deceased

## 2016-07-01 ENCOUNTER — Ambulatory Visit: Payer: Medicare Other | Admitting: Family Medicine

## 2016-07-17 ENCOUNTER — Ambulatory Visit: Payer: Medicare Other | Admitting: Nurse Practitioner

## 2016-07-25 ENCOUNTER — Other Ambulatory Visit: Payer: Self-pay | Admitting: Nurse Practitioner

## 2018-01-30 IMAGING — XA IR US GUIDE VASC ACCESS RIGHT
1 series · 1 of 1 positions shown · non-contrast
Comparison: None.

INDICATION: Primary left lung cancer.

EXAM:
FLUOROSCOPIC AND ULTRASOUND GUIDED PLACEMENT OF A SUBCUTANEOUS PORT

[Series 300: line placements · 1 of 1 slices shown]
[im 1/1]
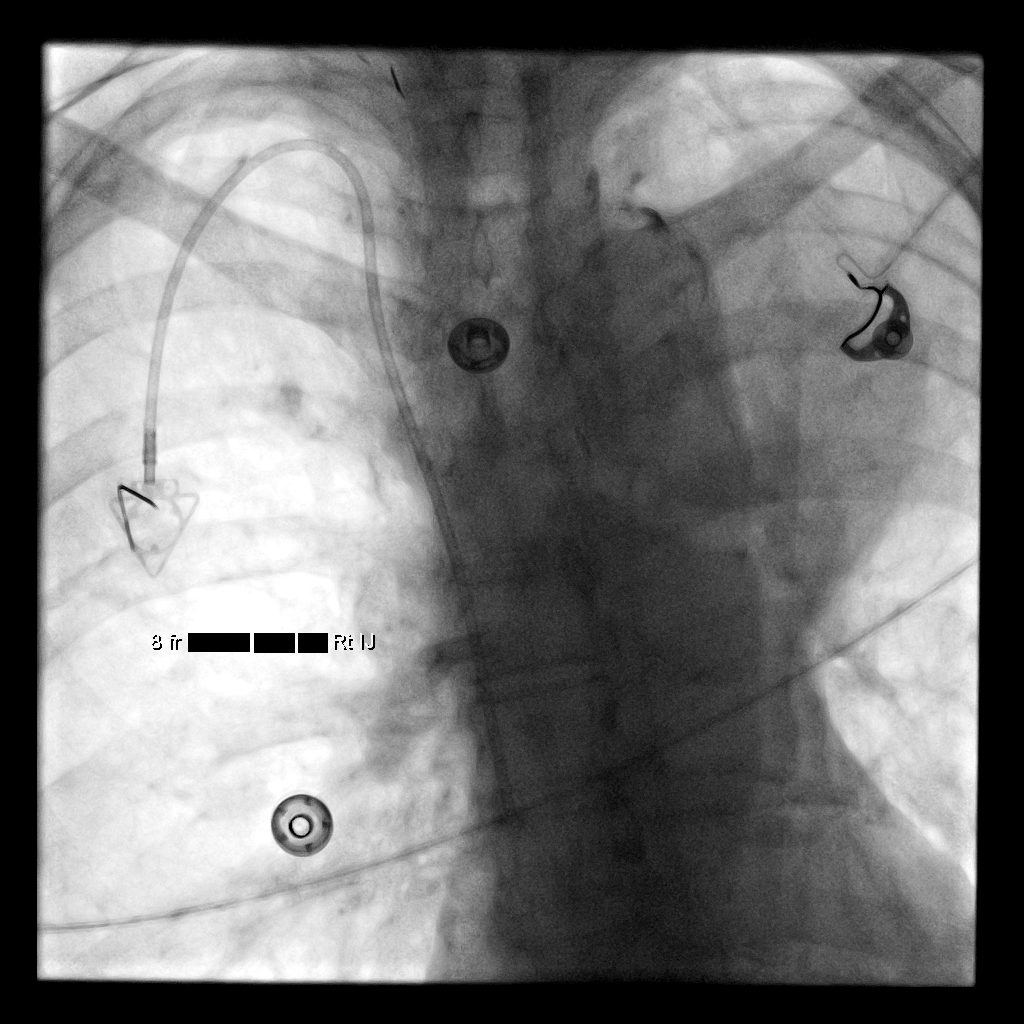

[1 of 1 positions shown; findings below may reference images not displayed]

MEDICATIONS:
Vancomycin 1 g; The antibiotic was administered within an
appropriate time interval prior to skin puncture.

ANESTHESIA/SEDATION:
Versed 1.5 mg IV; Fentanyl 75 mcg IV;

Moderate Sedation Time:  34 minutes

The patient was continuously monitored during the procedure by the
interventional radiology nurse under my direct supervision.

FLUOROSCOPY TIME:  6 seconds, 1 mGy

COMPLICATIONS:
None immediate.

PROCEDURE:
The procedure, risks, benefits, and alternatives were explained to
the patient. Questions regarding the procedure were encouraged and
answered. The patient understands and consents to the procedure.

Patient was placed supine on the interventional table. Ultrasound
confirmed a patent right internal jugular vein. The right chest and
neck were cleaned with a skin antiseptic and a sterile drape was
placed. Maximal barrier sterile technique was utilized including
caps, mask, sterile gowns, sterile gloves, sterile drape, hand
hygiene and skin antiseptic. The right neck was anesthetized with 1%
lidocaine. Small incision was made in the right neck with a blade.
Micropuncture set was placed in the right internal jugular vein with
ultrasound guidance. The micropuncture wire was used for measurement
purposes. The right chest was anesthetized with 1% lidocaine with
epinephrine. #15 blade was used to make an incision and a
subcutaneous port pocket was formed. 8 french Power Port was
assembled. Subcutaneous tunnel was formed with a stiff tunneling
device. The port catheter was brought through the subcutaneous
tunnel. The port was placed in the subcutaneous pocket. The
micropuncture set was exchanged for a peel-away sheath. The catheter
was placed through the peel-away sheath and the tip was positioned
at the superior cavoatrial junction. Catheter placement was
confirmed with fluoroscopy. The port was accessed and flushed with
heparinized saline. The port pocket was closed using two layers of
absorbable sutures and Dermabond. The vein skin site was closed
using a single layer of absorbable suture and Dermabond. Sterile
dressings were applied. Patient tolerated the procedure well without
an immediate complication. Ultrasound and fluoroscopic images were
taken and saved for this procedure.
IMPRESSION: Placement of a subcutaneous port device.

## 2018-02-20 IMAGING — DX DG ABDOMEN ACUTE W/ 1V CHEST
3 series · 3 of 3 positions shown · non-contrast
Comparison: PET-CT 04/21/2016.  Chest radiographs 03/31/2016.

CLINICAL DATA: Generalized abdominal pain for 3 days. Rectal
bleeding. History of lung and bladder cancer.

EXAM:
DG ABDOMEN ACUTE W/ 1V CHEST

[chest pa]
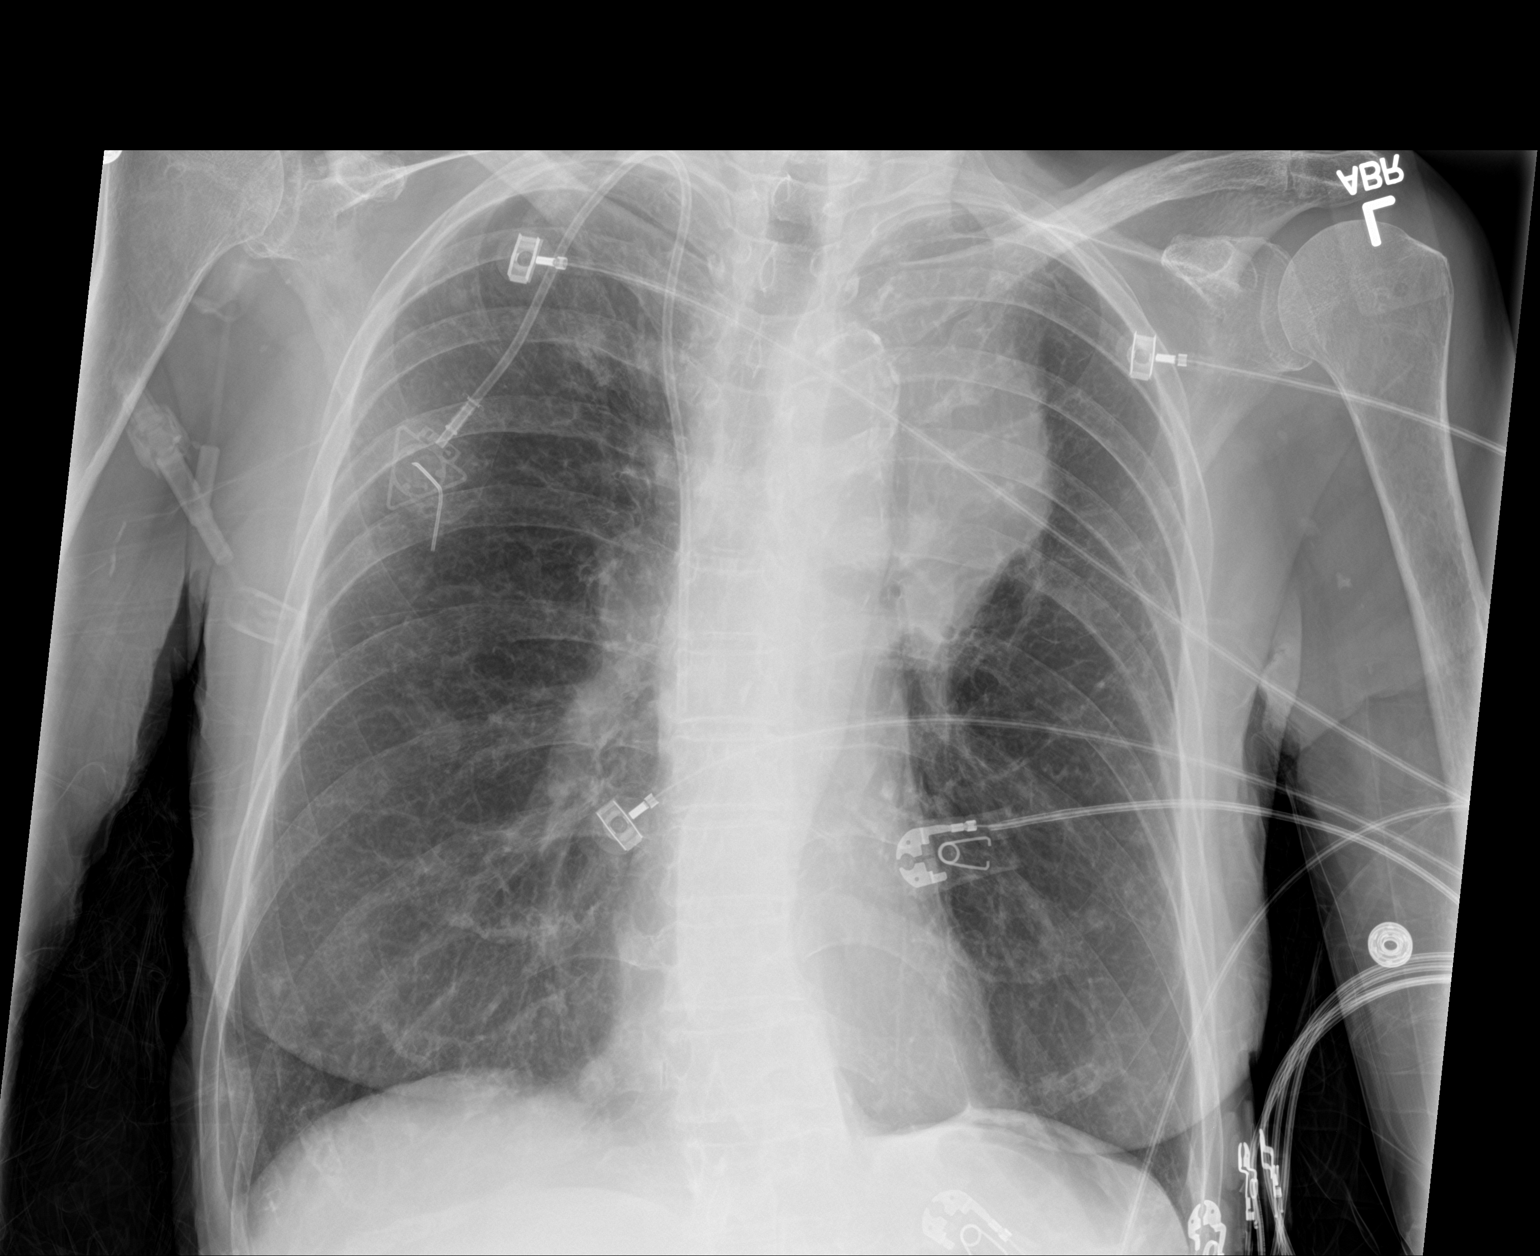

[abdomen erect]
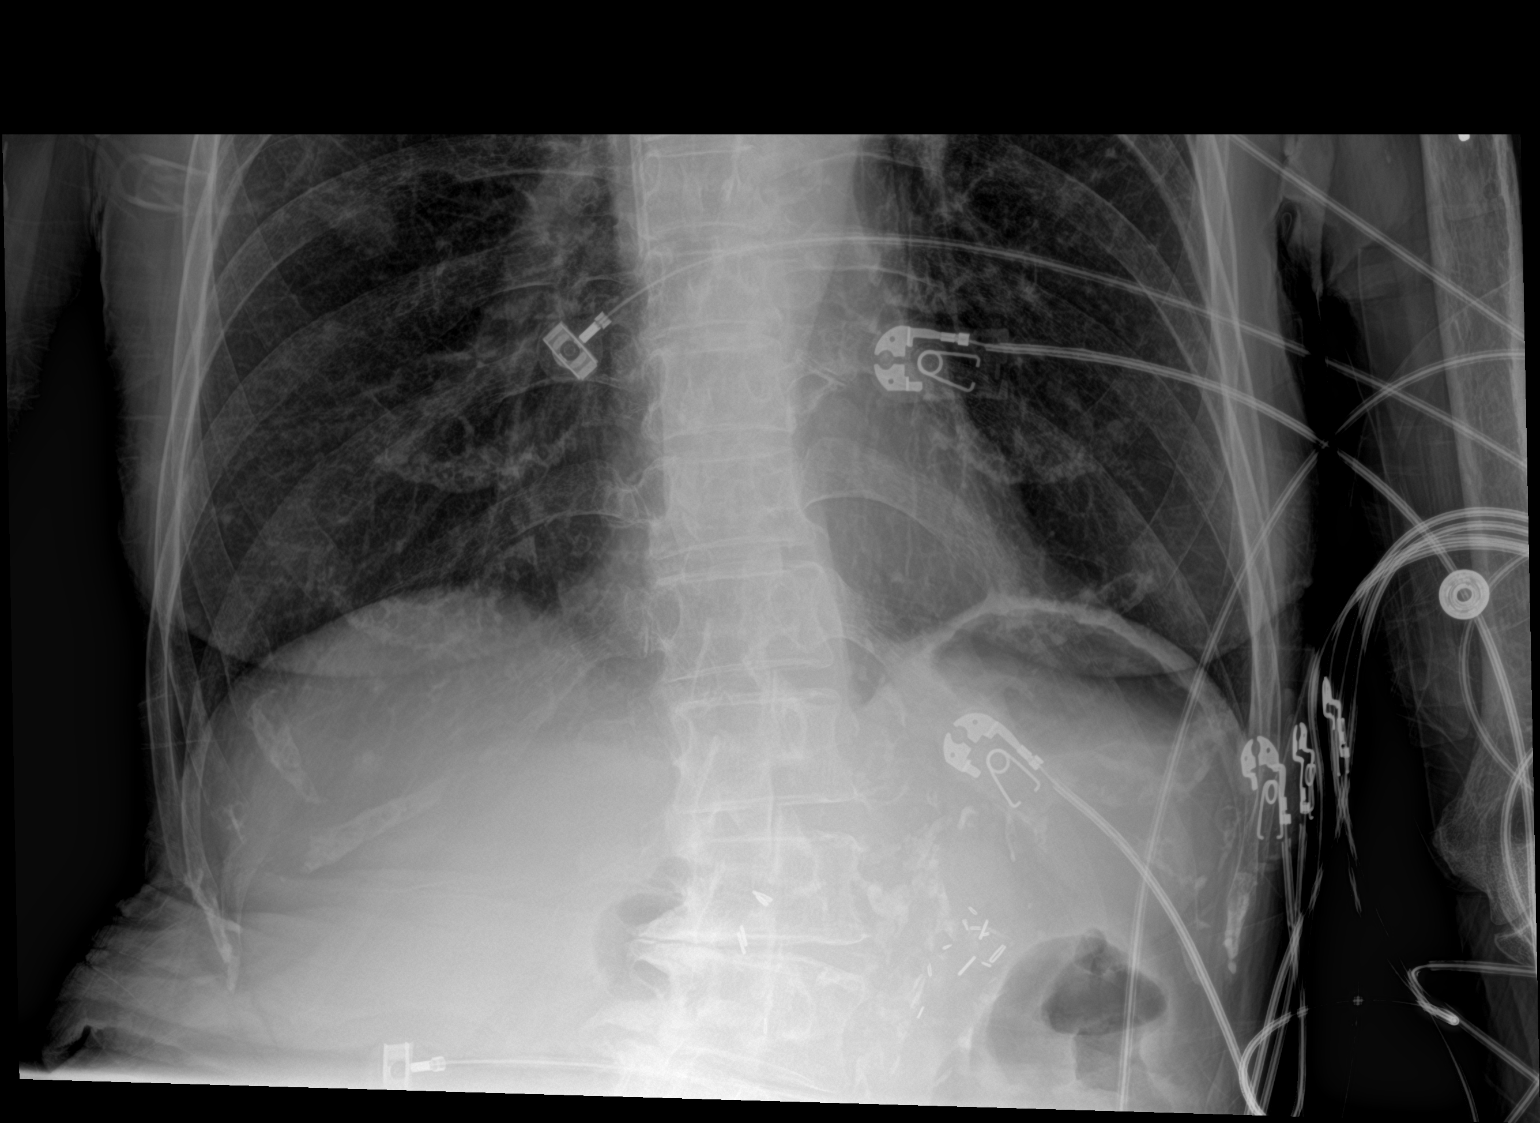

[abdomen supine]
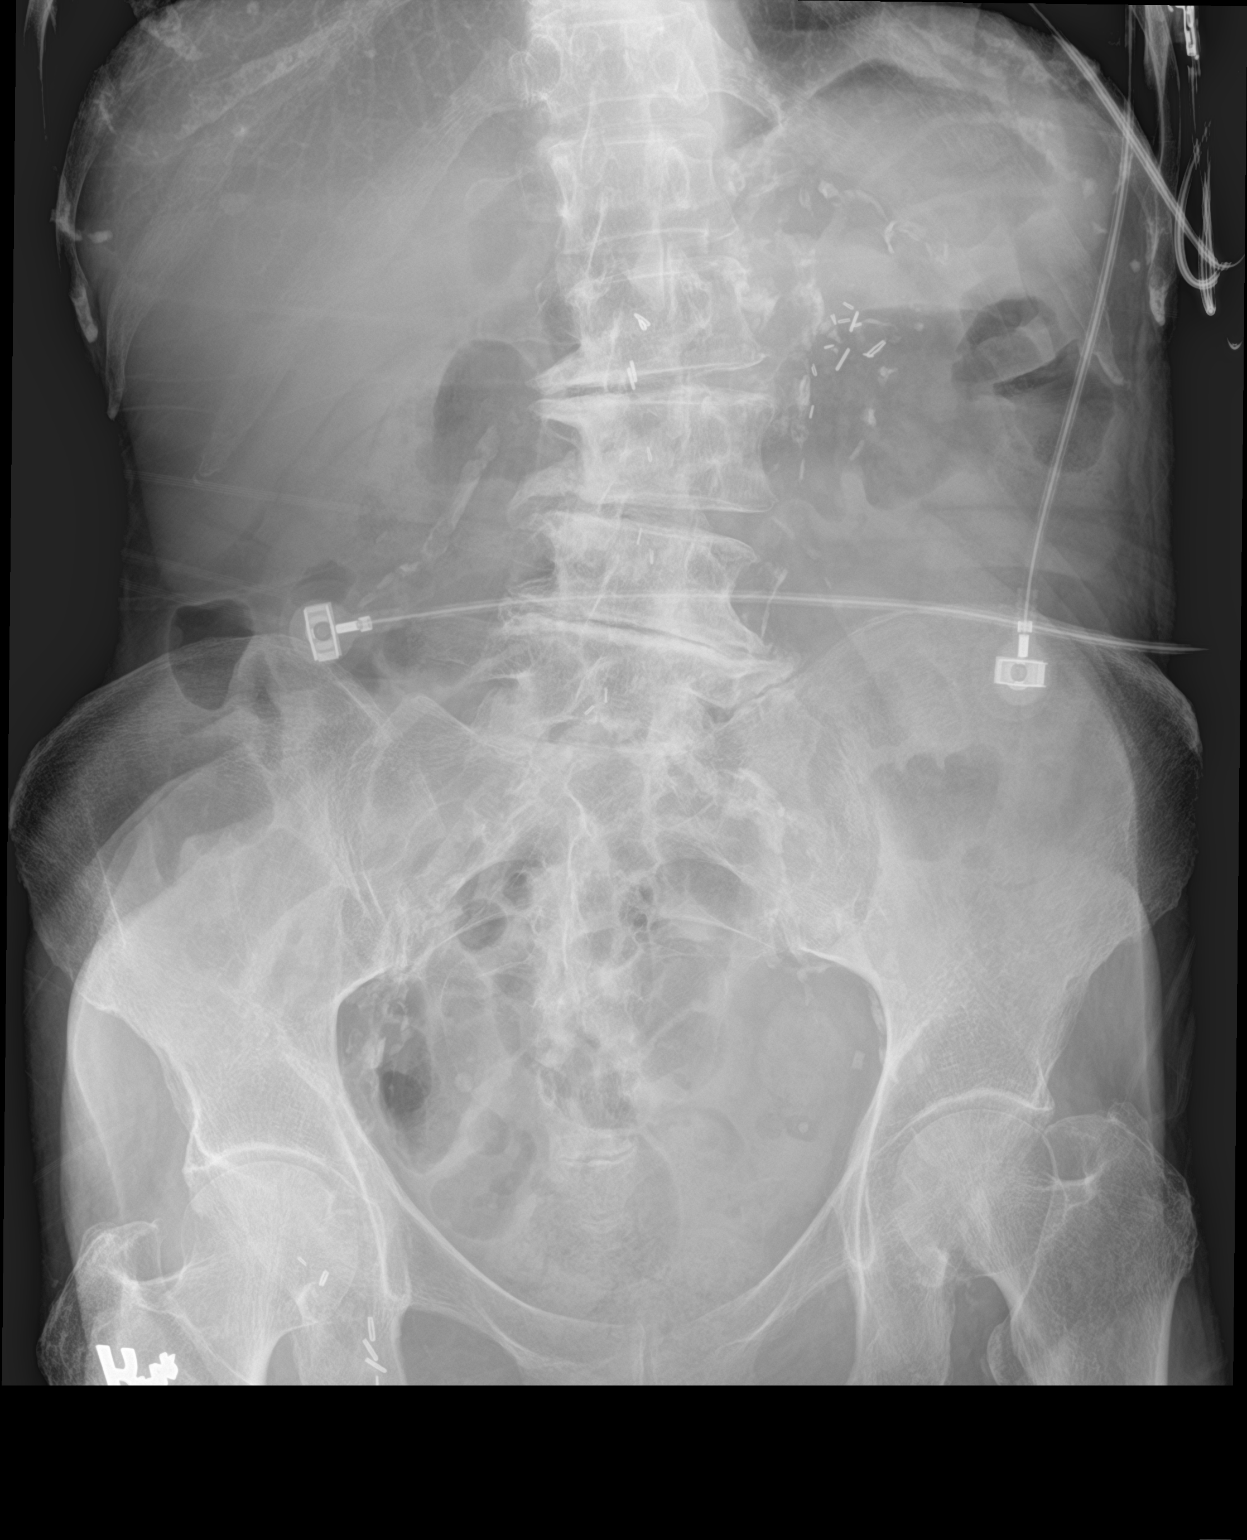

[3 of 3 positions shown; findings below may reference images not displayed]

FINDINGS: A right jugular Port-A-Cath terminates over the lower SVC. The
cardiac silhouette is normal in size. Aortic atherosclerosis is
noted. Surgical clips are present in the right lower neck. Confluent
left upper lobe opacity corresponds to the known mass and associated
left upper lobe collapse. Elsewhere, the lungs are hyperinflated
with evidence of emphysema. Small nodules are scattered throughout
both lungs as seen on the recent PET-CT. There is no evidence of new
airspace consolidation, edema, sizable pleural effusion, or
pneumothorax.

There is no evidence of intraperitoneal free air. Gas is present in
nondilated loops of bowel without evidence of obstruction. Surgical
clips are present in the central abdomen and right inguinal region.
Atherosclerotic vascular calcification is noted. Multilevel disc
degeneration and mild levoscoliosis are present in the lumbar spine.
IMPRESSION: 1. Left upper lobe mass and scattered bilateral lung nodules
consistent with known malignancy.
2. No evidence of acute airspace disease.
3. Nonobstructed bowel gas pattern.
4. Aortic atherosclerosis.
